# Patient Record
Sex: Female | Born: 1960 | Race: White | Hispanic: No | Marital: Married | Smoking: Never smoker
Health system: Southern US, Community
[De-identification: ages and names within clinical notes are randomized; demographics above are authoritative.]

## PROBLEM LIST (undated history)

## (undated) DIAGNOSIS — I10 Essential (primary) hypertension: Secondary | ICD-10-CM

## (undated) DIAGNOSIS — I639 Cerebral infarction, unspecified: Secondary | ICD-10-CM

## (undated) DIAGNOSIS — E039 Hypothyroidism, unspecified: Secondary | ICD-10-CM

## (undated) DIAGNOSIS — G2581 Restless legs syndrome: Secondary | ICD-10-CM

## (undated) HISTORY — DX: Hypothyroidism, unspecified: E03.9

## (undated) HISTORY — DX: Cerebral infarction, unspecified: I63.9

## (undated) HISTORY — DX: Restless legs syndrome: G25.81

## (undated) HISTORY — DX: Essential (primary) hypertension: I10

---

## 1998-06-22 ENCOUNTER — Encounter (HOSPITAL_COMMUNITY): Admission: RE | Admit: 1998-06-22 | Discharge: 1998-07-10 | Payer: Self-pay | Admitting: *Deleted

## 1998-07-08 ENCOUNTER — Inpatient Hospital Stay (HOSPITAL_COMMUNITY): Admission: AD | Admit: 1998-07-08 | Discharge: 1998-07-11 | Payer: Self-pay | Admitting: Obstetrics and Gynecology

## 2005-05-20 ENCOUNTER — Emergency Department (HOSPITAL_COMMUNITY): Admission: EM | Admit: 2005-05-20 | Discharge: 2005-05-20 | Payer: Self-pay | Admitting: Emergency Medicine
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
## 2019-10-10 ENCOUNTER — Ambulatory Visit: Payer: Federal, State, Local not specified - PPO | Attending: Internal Medicine

## 2019-10-10 DIAGNOSIS — Z23 Encounter for immunization: Secondary | ICD-10-CM

## 2019-10-10 NOTE — Progress Notes (Signed)
   Covid-19 Vaccination Clinic  Name:  SEMYA KLINKE    MRN: [id number] DOB: 13-Apr-1961  10/10/2019  Ms. Geck was observed post Covid-19 immunization for 15 minutes without incident. She was provided with Vaccine Information Sheet and instruction to access the V-Safe system.   Ms. Caratachea was instructed to call 911 with any severe reactions post vaccine: Marland Kitchen Difficulty breathing  . Swelling of face and throat  . A fast heartbeat  . A bad rash all over body  . Dizziness and weakness   Immunizations Administered    Name Date Dose VIS Date Route   Pfizer COVID-19 Vaccine 10/10/2019  2:44 PM 0.3 mL 07/17/2018 Intramuscular   Manufacturer: [employer], [employer]   Lot: [id number]   NDC: [address]-2

## 2019-11-04 ENCOUNTER — Ambulatory Visit: Payer: Federal, State, Local not specified - PPO | Attending: Internal Medicine

## 2019-11-04 DIAGNOSIS — Z23 Encounter for immunization: Secondary | ICD-10-CM

## 2019-11-04 NOTE — Progress Notes (Signed)
   Covid-19 Vaccination Clinic  Name:  HADLIE GIPSON    MRN: [id number] DOB: 1961-04-01  11/04/2019  Ms. Taussig was observed post Covid-19 immunization for 15 minutes without incident. She was provided with Vaccine Information Sheet and instruction to access the V-Safe system.   Ms. Frith was instructed to call 911 with any severe reactions post vaccine: Marland Kitchen Difficulty breathing  . Swelling of face and throat  . A fast heartbeat  . A bad rash all over body  . Dizziness and weakness   Immunizations Administered    Name Date Dose VIS Date Route   Pfizer COVID-19 Vaccine 11/04/2019  2:29 PM 0.3 mL 07/17/2018 Intramuscular   Manufacturer: [employer], [employer]   Lot: [id number]   NDC: [address]-1

## 2021-04-04 ENCOUNTER — Other Ambulatory Visit: Payer: Self-pay

## 2021-04-04 ENCOUNTER — Emergency Department (HOSPITAL_COMMUNITY): Payer: Federal, State, Local not specified - PPO

## 2021-04-04 ENCOUNTER — Inpatient Hospital Stay (HOSPITAL_COMMUNITY)
Admission: EM | Admit: 2021-04-04 | Discharge: 2021-04-08 | DRG: 065 | Disposition: A | Discharge status: Rehabilitation Facility | Payer: Federal, State, Local not specified - PPO | Attending: Internal Medicine | Admitting: Internal Medicine

## 2021-04-04 ENCOUNTER — Encounter (HOSPITAL_COMMUNITY): Payer: Self-pay | Admitting: Emergency Medicine

## 2021-04-04 DIAGNOSIS — I619 Nontraumatic intracerebral hemorrhage, unspecified: Secondary | ICD-10-CM | POA: Diagnosis not present

## 2021-04-04 DIAGNOSIS — M4802 Spinal stenosis, cervical region: Secondary | ICD-10-CM | POA: Diagnosis not present

## 2021-04-04 DIAGNOSIS — Z20822 Contact with and (suspected) exposure to covid-19: Secondary | ICD-10-CM | POA: Diagnosis not present

## 2021-04-04 DIAGNOSIS — R531 Weakness: Secondary | ICD-10-CM | POA: Diagnosis not present

## 2021-04-04 DIAGNOSIS — M6281 Muscle weakness (generalized): Secondary | ICD-10-CM | POA: Diagnosis not present

## 2021-04-04 DIAGNOSIS — I6389 Other cerebral infarction: Secondary | ICD-10-CM | POA: Diagnosis not present

## 2021-04-04 DIAGNOSIS — E876 Hypokalemia: Secondary | ICD-10-CM | POA: Diagnosis not present

## 2021-04-04 DIAGNOSIS — M50222 Other cervical disc displacement at C5-C6 level: Secondary | ICD-10-CM | POA: Diagnosis not present

## 2021-04-04 DIAGNOSIS — I639 Cerebral infarction, unspecified: Secondary | ICD-10-CM | POA: Diagnosis not present

## 2021-04-04 DIAGNOSIS — I1310 Hypertensive heart and chronic kidney disease without heart failure, with stage 1 through stage 4 chronic kidney disease, or unspecified chronic kidney disease: Secondary | ICD-10-CM | POA: Diagnosis present

## 2021-04-04 DIAGNOSIS — I6381 Other cerebral infarction due to occlusion or stenosis of small artery: Principal | ICD-10-CM | POA: Diagnosis present

## 2021-04-04 DIAGNOSIS — E785 Hyperlipidemia, unspecified: Secondary | ICD-10-CM | POA: Diagnosis not present

## 2021-04-04 DIAGNOSIS — R278 Other lack of coordination: Secondary | ICD-10-CM | POA: Diagnosis present

## 2021-04-04 DIAGNOSIS — I6501 Occlusion and stenosis of right vertebral artery: Secondary | ICD-10-CM | POA: Diagnosis not present

## 2021-04-04 DIAGNOSIS — M50221 Other cervical disc displacement at C4-C5 level: Secondary | ICD-10-CM | POA: Diagnosis not present

## 2021-04-04 DIAGNOSIS — S22009A Unspecified fracture of unspecified thoracic vertebra, initial encounter for closed fracture: Secondary | ICD-10-CM | POA: Diagnosis not present

## 2021-04-04 DIAGNOSIS — G8191 Hemiplegia, unspecified affecting right dominant side: Secondary | ICD-10-CM | POA: Diagnosis present

## 2021-04-04 DIAGNOSIS — N179 Acute kidney failure, unspecified: Secondary | ICD-10-CM | POA: Diagnosis not present

## 2021-04-04 DIAGNOSIS — N1832 Chronic kidney disease, stage 3b: Secondary | ICD-10-CM | POA: Diagnosis present

## 2021-04-04 DIAGNOSIS — I10 Essential (primary) hypertension: Secondary | ICD-10-CM | POA: Diagnosis not present

## 2021-04-04 DIAGNOSIS — K5901 Slow transit constipation: Secondary | ICD-10-CM | POA: Diagnosis not present

## 2021-04-04 DIAGNOSIS — R200 Anesthesia of skin: Secondary | ICD-10-CM | POA: Diagnosis present

## 2021-04-04 DIAGNOSIS — R030 Elevated blood-pressure reading, without diagnosis of hypertension: Secondary | ICD-10-CM | POA: Diagnosis not present

## 2021-04-04 DIAGNOSIS — Z6841 Body Mass Index (BMI) 40.0 and over, adult: Secondary | ICD-10-CM

## 2021-04-04 DIAGNOSIS — I3139 Other pericardial effusion (noninflammatory): Secondary | ICD-10-CM

## 2021-04-04 DIAGNOSIS — I739 Peripheral vascular disease, unspecified: Secondary | ICD-10-CM | POA: Diagnosis not present

## 2021-04-04 DIAGNOSIS — I6782 Cerebral ischemia: Secondary | ICD-10-CM | POA: Diagnosis not present

## 2021-04-04 DIAGNOSIS — R29702 NIHSS score 2: Secondary | ICD-10-CM | POA: Diagnosis present

## 2021-04-04 DIAGNOSIS — E039 Hypothyroidism, unspecified: Secondary | ICD-10-CM | POA: Diagnosis present

## 2021-04-04 DIAGNOSIS — R9082 White matter disease, unspecified: Secondary | ICD-10-CM | POA: Diagnosis not present

## 2021-04-04 DIAGNOSIS — Z8673 Personal history of transient ischemic attack (TIA), and cerebral infarction without residual deficits: Secondary | ICD-10-CM

## 2021-04-04 DIAGNOSIS — W19XXXA Unspecified fall, initial encounter: Secondary | ICD-10-CM

## 2021-04-04 DIAGNOSIS — R7989 Other specified abnormal findings of blood chemistry: Secondary | ICD-10-CM | POA: Diagnosis present

## 2021-04-04 DIAGNOSIS — Z8249 Family history of ischemic heart disease and other diseases of the circulatory system: Secondary | ICD-10-CM

## 2021-04-04 DIAGNOSIS — Z79899 Other long term (current) drug therapy: Secondary | ICD-10-CM

## 2021-04-04 DIAGNOSIS — E782 Mixed hyperlipidemia: Secondary | ICD-10-CM | POA: Diagnosis not present

## 2021-04-04 LAB — PROTIME-INR
INR: 1 (ref 0.8–1.2)
Prothrombin Time: 13.4 seconds (ref 11.4–15.2)

## 2021-04-04 LAB — DIFFERENTIAL
Abs Immature Granulocytes: 0.02 10*3/uL (ref 0.00–0.07)
Basophils Absolute: 0.1 10*3/uL (ref 0.0–0.1)
Basophils Relative: 1 %
Eosinophils Absolute: 0.2 10*3/uL (ref 0.0–0.5)
Eosinophils Relative: 2 %
Immature Granulocytes: 0 %
Lymphocytes Relative: 23 %
Lymphs Abs: 1.7 10*3/uL (ref 0.7–4.0)
Monocytes Absolute: 0.4 10*3/uL (ref 0.1–1.0)
Monocytes Relative: 5 %
Neutro Abs: 5.2 10*3/uL (ref 1.7–7.7)
Neutrophils Relative %: 69 %

## 2021-04-04 LAB — CBC
HCT: 43.4 % (ref 36.0–46.0)
Hemoglobin: 14 g/dL (ref 12.0–15.0)
MCH: 29.7 pg (ref 26.0–34.0)
MCHC: 32.3 g/dL (ref 30.0–36.0)
MCV: 92.1 fL (ref 80.0–100.0)
Platelets: 252 10*3/uL (ref 150–400)
RBC: 4.71 MIL/uL (ref 3.87–5.11)
RDW: 14.6 % (ref 11.5–15.5)
WBC: 7.6 10*3/uL (ref 4.0–10.5)
nRBC: 0 % (ref 0.0–0.2)

## 2021-04-04 LAB — I-STAT CHEM 8, ED
BUN: 11 mg/dL (ref 6–20)
Calcium, Ion: 1.15 mmol/L (ref 1.15–1.40)
Chloride: 100 mmol/L (ref 98–111)
Creatinine, Ser: 1.6 mg/dL — ABNORMAL HIGH (ref 0.44–1.00)
Glucose, Bld: 93 mg/dL (ref 70–99)
HCT: 43 % (ref 36.0–46.0)
Hemoglobin: 14.6 g/dL (ref 12.0–15.0)
Potassium: 4 mmol/L (ref 3.5–5.1)
Sodium: 139 mmol/L (ref 135–145)
TCO2: 29 mmol/L (ref 22–32)

## 2021-04-04 LAB — COMPREHENSIVE METABOLIC PANEL
ALT: 14 U/L (ref 0–44)
AST: 27 U/L (ref 15–41)
Albumin: 4.4 g/dL (ref 3.5–5.0)
Alkaline Phosphatase: 57 U/L (ref 38–126)
Anion gap: 11 (ref 5–15)
BUN: 10 mg/dL (ref 6–20)
CO2: 28 mmol/L (ref 22–32)
Calcium: 9.6 mg/dL (ref 8.9–10.3)
Chloride: 99 mmol/L (ref 98–111)
Creatinine, Ser: 1.59 mg/dL — ABNORMAL HIGH (ref 0.44–1.00)
GFR, Estimated: 37 mL/min — ABNORMAL LOW (ref >60)
Glucose, Bld: 97 mg/dL (ref 70–99)
Potassium: 4 mmol/L (ref 3.5–5.1)
Sodium: 138 mmol/L (ref 135–145)
Total Bilirubin: 0.8 mg/dL (ref 0.3–1.2)
Total Protein: 7.7 g/dL (ref 6.5–8.1)

## 2021-04-04 LAB — APTT: aPTT: 31 seconds (ref 24–36)

## 2021-04-04 LAB — CBG MONITORING, ED: Glucose-Capillary: 101 mg/dL — ABNORMAL HIGH (ref 70–99)

## 2021-04-04 LAB — I-STAT BETA HCG BLOOD, ED (MC, WL, AP ONLY): I-stat hCG, quantitative: <5 m[IU]/mL (ref <5)

## 2021-04-04 IMAGING — MR MR CERVICAL SPINE W/O CM
5 of 8 series · 25 of 48 positions shown · non-contrast
Comparison: None available.

CLINICAL DATA: Initial evaluation for cervical radiculopathy, right
arm and leg paresthesias.

EXAM:
MRI CERVICAL SPINE WITHOUT CONTRAST
TECHNIQUE: Multiplanar, multisequence MR imaging of the cervical spine was
performed. No intravenous contrast was administered.

[Series 5: T2 · sagittal · 3.0mm · 0.69mm/px · 3 of 15 slices shown (1 of 2)]
[im 1/15]
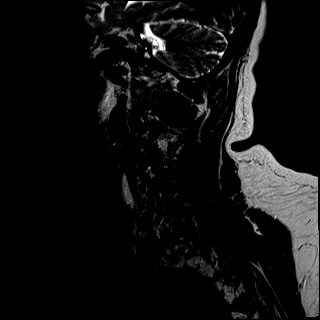
[im 8/15]
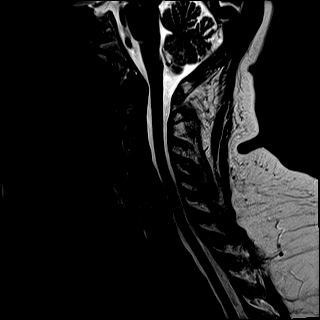
[im 15/15]
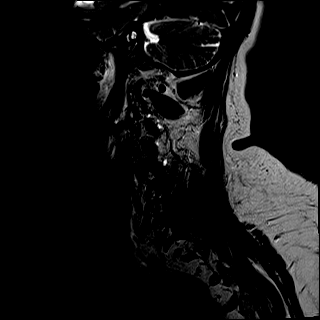

[Series 6: T1 · sagittal · 3.0mm · 0.69mm/px · 3 of 15 slices shown]
[im 1/15]
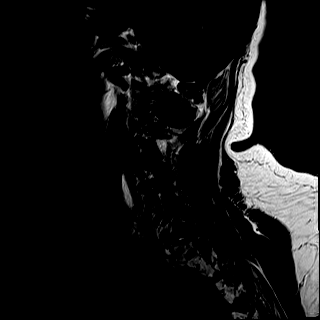
[im 8/15]
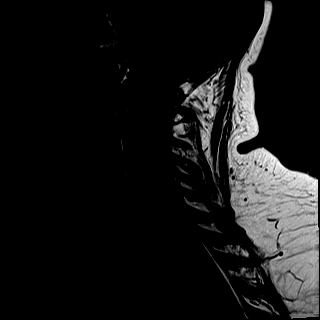
[im 15/15]
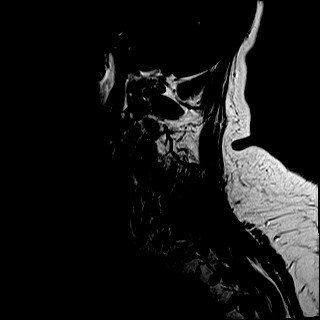

[Series 7: STIR · sagittal · 3.0mm · 0.86mm/px · 3 of 15 slices shown]
[im 1/15]
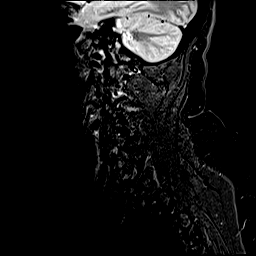
[im 8/15]
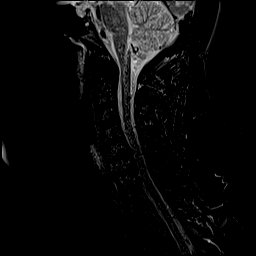
[im 15/15]
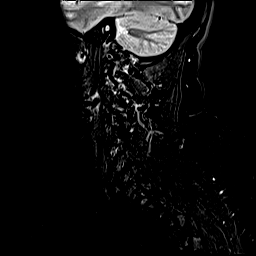

[Series 8: T2 · axial · 3.0mm · 0.66mm/px · z∈[-287,-143]mm · 8 of 46 slices shown (2 of 2)]
[im 1/46]
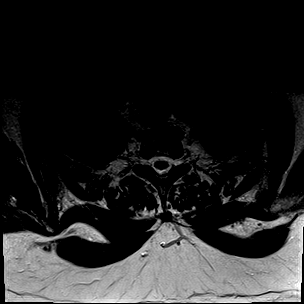
[im 7/46]
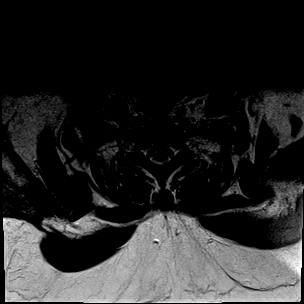
[im 13/46]
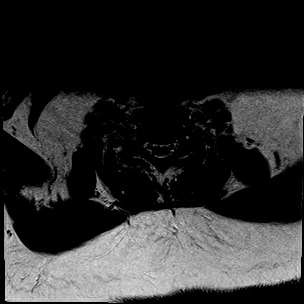
[im 20/46]
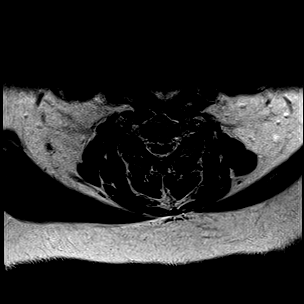
[im 26/46]
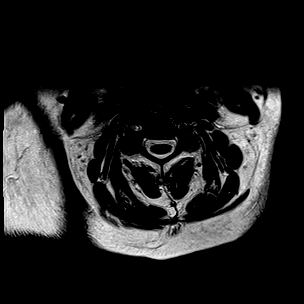
[im 33/46]
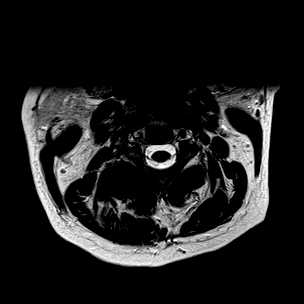
[im 39/46]
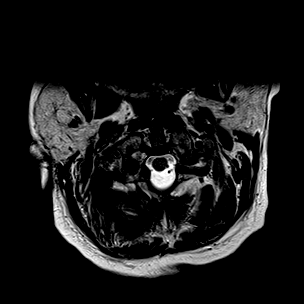
[im 46/46]
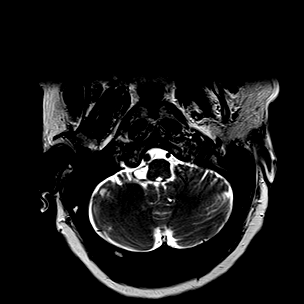

[Series 9: GRE · axial · 3.0mm · 0.39mm/px · z∈[-287,-143]mm · 8 of 46 slices shown]
[im 1/46]
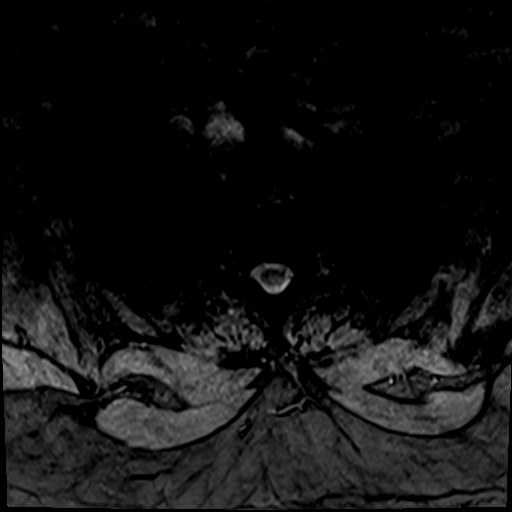
[im 7/46]
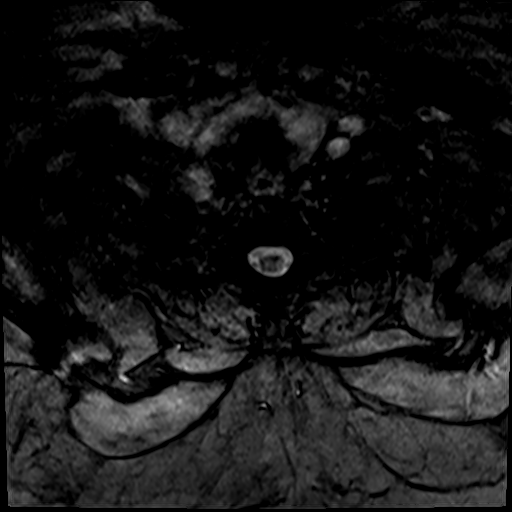
[im 13/46]
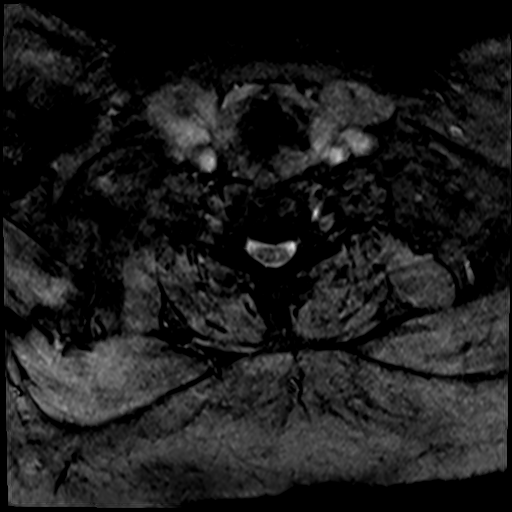
[im 20/46]
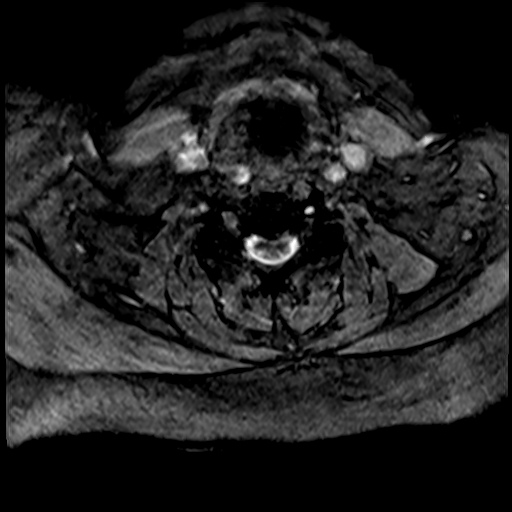
[im 26/46]
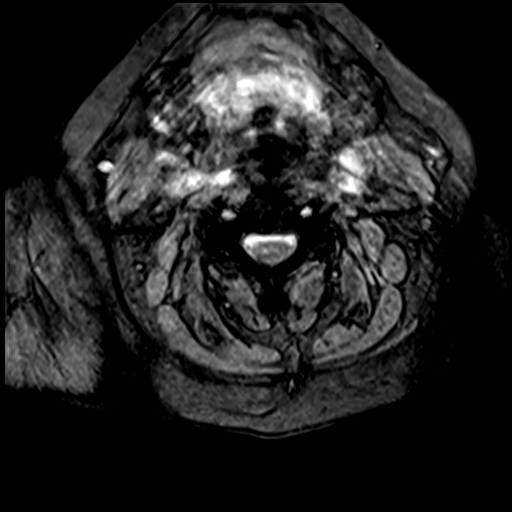
[im 33/46]
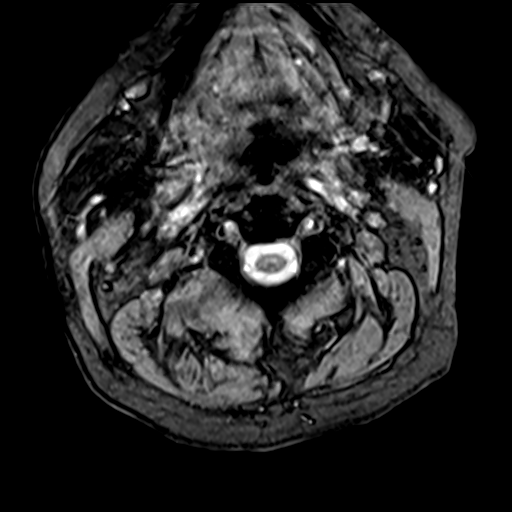
[im 39/46]
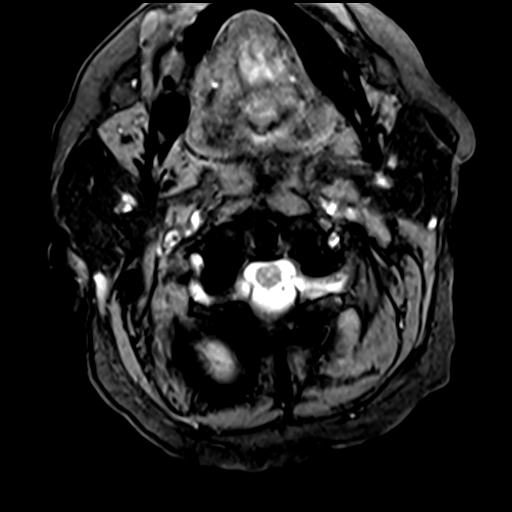
[im 46/46]
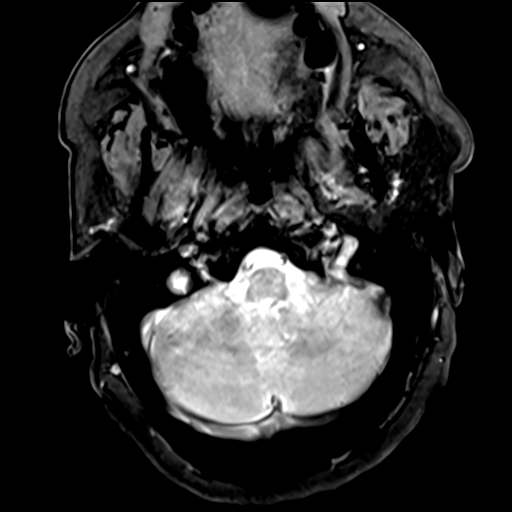

[25 of 48 positions shown; findings below may reference images not displayed]

FINDINGS: Alignment: Straightening with slight reversal of the normal cervical
lordosis. No listhesis.

Vertebrae: Vertebral body height maintained without acute or chronic
fracture. Bone marrow signal intensity within normal limits. No
discrete or worrisome osseous lesions. No abnormal marrow edema.

Cord: Normal signal and morphology.

Posterior Fossa, vertebral arteries, paraspinal tissues:
Craniocervical junction within normal limits. Paraspinous soft
tissues demonstrate no acute finding. Absent flow void within the
right vertebral artery, likely occluded.

Disc levels:

C2-C3: Negative interspace. Mild left-sided facet hypertrophy. No
stenosis.

C3-C4: Mild endplate spurring without significant disc bulge. Mild
facet hypertrophy. No spinal stenosis. Mild bilateral C4 foraminal
narrowing.

C4-C5: Mild disc bulge with uncovertebral hypertrophy. Right-sided
facet degeneration. No spinal stenosis. Mild left C5 foraminal
narrowing. Right neural foramina remains patent.

C5-C6: Degenerative intervertebral disc space narrowing. Left
paracentral disc protrusion indents the left ventral thecal sac,
contacting and mildly flattening the ventral cord (series 8, image
29). Mild spinal stenosis. Superimposed uncovertebral spurring with
resultant mild right C6 foraminal narrowing. Left neural foramina
remains patent.

C6-C7: Degenerative intervertebral disc space narrowing with diffuse
disc bulge, endplate spurring, and uncovertebral hypertrophy. Broad
posterior disc osteophyte flattens and partially faces the ventral
thecal sac with resultant mild spinal stenosis. Foramina remain
patent.

C7-T1: Negative interspace. Left greater than right facet
hypertrophy. No spinal stenosis. Mild left C8 foraminal narrowing.
Right neural foramina remains patent.

Visualized upper thoracic spine demonstrates no significant finding.
IMPRESSION: 1. Left paracentral disc protrusion at C5-6 contacting and mildly
flattening the left hemi cord and resulting in mild spinal stenosis.
2. Degenerative disc osteophyte at C6-7 with resultant mild spinal
stenosis.
3. Mild bilateral C4, left C5, right C6, and left C8 foraminal
stenosis related to disc bulging and uncovertebral disease.
4. Absent flow void within the right vertebral artery, likely
occluded.

## 2021-04-04 MED ORDER — SODIUM CHLORIDE 0.9% FLUSH
3.0000 mL | Freq: Once | INTRAVENOUS | Status: DC
Start: 2021-04-04 — End: 2021-04-08

## 2021-04-04 MED ORDER — LORAZEPAM 1 MG PO TABS
1.0000 mg | ORAL_TABLET | Freq: Once | ORAL | Status: AC | PRN
  Administered 2021-04-04: 1 mg via ORAL
  Filled 2021-04-04: qty 1

## 2021-04-04 MED ORDER — AMLODIPINE BESYLATE 5 MG PO TABS
10.0000 mg | ORAL_TABLET | Freq: Once | ORAL | Status: AC
  Administered 2021-04-04: 10 mg via ORAL
  Filled 2021-04-04: qty 2

## 2021-04-04 NOTE — ED Notes (Signed)
Pt transported to MRI 

## 2021-04-04 NOTE — ED Provider Notes (Signed)
[facility] EMERGENCY DEPARTMENT Provider Note   CSN: [id number] Arrival date & time: 04/04/21  1447     History Chief Complaint  Patient presents with   Weakness    Christine Fritz is a 60 y.o. female present emergency department with paresthesias in the right arm and leg.  She reports that she had an episode 2 weeks ago that lasted about a day and resolved on its own.  She reports her symptoms returned yesterday and persisted throughout the evening.  She has paresthesias and a sense of "heaviness or something different" on the right arm and leg.  It spares her face.  She denies any headache.  Denies blurred vision.  Denies prior history of TIA or stroke.  She reports she does not see a doctor often, last went to the doctor's office maybe 20 years ago.  She is unaware of any history of hypertension, high cholesterol, diabetes.  She denies any chest pain or pressure with these symptoms.  She is here with her husband at the bedside  HPI     History reviewed. No pertinent past medical history.  There are no problems to display for this patient.   History reviewed. No pertinent surgical history.   OB History   No obstetric history on file.     No family history on file.  Social History   Tobacco Use   Smoking status: Never   Smokeless tobacco: Never  Substance Use Topics   Alcohol use: Not Currently   Drug use: Not Currently    Home Medications Prior to Admission medications   Medication Sig Start Date End Date Taking? Authorizing Provider  Chlorphen-Pseudoephed-APAP (TYLENOL ALLERGY SINUS PO) Take 2 tablets by mouth daily.   Yes [provider]  magnesium oxide (MAG-OX) 400 MG tablet Take 400 mg by mouth daily.   Yes [provider]    Allergies    Patient has no known allergies.  Review of Systems   Review of Systems  Constitutional:  Negative for chills and fever.  HENT:  Negative for ear pain and sore throat.   Eyes:   Negative for pain and visual disturbance.  Respiratory:  Negative for cough and shortness of breath.   Cardiovascular:  Negative for chest pain and palpitations.  Gastrointestinal:  Negative for abdominal pain and vomiting.  Genitourinary:  Negative for dysuria and hematuria.  Musculoskeletal:  Negative for arthralgias and back pain.  Skin:  Negative for color change and rash.  Neurological:  Positive for weakness and numbness. Negative for dizziness, syncope, facial asymmetry, light-headedness and headaches.  All other systems reviewed and are negative.  Physical Exam Updated Vital Signs BP (!) 173/122   Pulse 71   Temp 98.1 F (36.7 C) (Oral)   Resp 15   Ht 5\' 4"  (1.626 m)   Wt 112 kg   SpO2 99%   BMI 42.40 kg/m   Physical Exam Constitutional:      General: She is not in acute distress. HENT:     Head: Normocephalic and atraumatic.  Eyes:     Conjunctiva/sclera: Conjunctivae normal.     Pupils: Pupils are equal, round, and reactive to light.  Cardiovascular:     Rate and Rhythm: Normal rate and regular rhythm.     Pulses: Normal pulses.  Pulmonary:     Effort: Pulmonary effort is normal. No respiratory distress.  Abdominal:     General: There is no distension.     Tenderness: There is no  abdominal tenderness.  Skin:    General: Skin is warm and dry.  Neurological:     General: No focal deficit present.     Mental Status: She is alert and oriented to person, place, and time. Mental status is at baseline.     Cranial Nerves: No cranial nerve deficit.     Sensory: No sensory deficit.     Motor: No weakness.     Coordination: Coordination normal.     Gait: Gait normal.  Psychiatric:        Mood and Affect: Mood normal.        Behavior: Behavior normal.    ED Results / Procedures / Treatments   Labs (all labs ordered are listed, but only abnormal results are displayed) Labs Reviewed  COMPREHENSIVE METABOLIC PANEL - Abnormal; Notable for the following components:       Result Value   Creatinine, Ser 1.59 (*)    GFR, Estimated 37 (*)    All other components within normal limits  I-STAT CHEM 8, ED - Abnormal; Notable for the following components:   Creatinine, Ser 1.60 (*)    All other components within normal limits  CBG MONITORING, ED - Abnormal; Notable for the following components:   Glucose-Capillary 101 (*)    All other components within normal limits  PROTIME-INR  APTT  CBC  DIFFERENTIAL  I-STAT BETA HCG BLOOD, ED ([facility], [facility], [facility] ONLY)    EKG EKG Interpretation  Date/Time:  Sunday April 04 2021 16:34:26 EST Ventricular Rate:  79 PR Interval:  188 QRS Duration: 84 QT Interval:  350 QTC Calculation: 401 R Axis:   -9 Text Interpretation: Normal sinus rhythm Low voltage QRS Confirmed by Octaviano Glow [phone]) on 04/04/2021 6:35:06 PM  Radiology CT HEAD WO CONTRAST  Result Date: 04/04/2021 CLINICAL DATA:  Neuro deficit, acute, stroke suspected. Right-sided weakness. EXAM: CT HEAD WITHOUT CONTRAST TECHNIQUE: Contiguous axial images were obtained from the base of the skull through the vertex without intravenous contrast. COMPARISON:  None. 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 Mild chronic small vessel disease throughout the deep white matter. No hemorrhage or hydrocephalus. Vascular: No hyperdense vessel or unexpected calcification. Skull: No acute calvarial abnormality. 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 Chronic small vessel disease. Electronically Signed   By: Rolm Baptise M.D.   On: 04/04/2021 18:36    Procedures Procedures   Medications Ordered in ED Medications  sodium chloride flush (NS) 0.9 % injection 3 mL (has no administration in time range)  LORazepam (ATIVAN) tablet 1 mg (has no administration in time range)  amLODipine (NORVASC) tablet 10 mg (10 mg Oral Given 04/04/21 1940)    ED Course  I have reviewed  the triage vital signs and the nursing notes.  Pertinent labs & imaging results that were available during my care of the patient were reviewed by me and considered in my medical decision making (see chart for details).  Patient presents ED complaining of transient right-sided weakness, also paresthesias, that recurred again yesterday evening into today.  She has no obvious neurological deficits on exam.  No signs of large vessel occlusion.  She does not even report paresthesias to fine touch on the right side of her body, and simply repeats that it "feels different".  This could still be consistent with an infarct.  CT scan of the brain was reviewed showing possible right subacute versus age-indeterminate lacunar infarct.  MRI of the brain is now  pending.  I personally reviewed and interpreted the remainder of her labs.  Creatinine is 1.6.  Unclear what her recent baseline was.  Electrolytes are within normal limits.  Glucose within normal limits.  She is hypertensive, but may have underlying hypertension for a long time as she does not see doctors.  I started her on amlodipine here.  Supplemental history is provided by the patient's husband at bedside.  Clinical Course as of 04/04/21 2158  Nancy Fetter Apr 04, 2021  2139 Patient next for MRI. [MT]    Clinical Course User Index [MT] Wyvonnia Dusky, MD  11 pm - signed out to Dr Randal Buba pending MRI   Final Clinical Impression(s) / ED Diagnoses Final diagnoses:  None    Rx / DC Orders ED Discharge Orders     None        Destyni Hoppel, Carola Rhine, MD 04/05/21 [phone]

## 2021-04-04 NOTE — ED Triage Notes (Signed)
PT reports R sided weakness (arm and leg) that initially happened for a few hours- 2 weeks ago.  States she has felt fine for the last 2 weeks and had symptoms for a few hours again yesterday.  Went to bed (LKW) at 10pm.  Woke up with R sided arm and leg weakness at 2am.  States it has improved some since this morning.  R arm drift.

## 2021-04-04 NOTE — ED Provider Notes (Signed)
Emergency Medicine Provider Triage Evaluation Note  Christine Fritz , a 60 y.o. female  was evaluated in triage.  Pt complains of right-sided weakness.  She reports that 2 weeks ago she fell related to weakness in her right leg and right arm.  She states that she could not get up due to the weakness.  She states that after couple of hours this resolved.  She states that last night she had similar symptoms however this seemed to resolve.  When she went to bed last night around 10 PM she was within normal limits.  She woke up around 2 AM and noticed weakness in her right arm.  She states that it has been consistent since then.  She thought it was slightly improving while in the waiting room however still present.  Her significant other is with her.  Has not noticed any facial droop or speech changes.  No history of stroke.  Review of Systems  Positive: + R arm weakness Negative: - facial droop, speech changes  Physical Exam  BP (!) 156/128   Pulse 76   Temp 97.6 F (36.4 C)   Resp 18   SpO2 99%  Gen:   Awake, no distress   Resp:  Normal effort  MSK:   Moves extremities without difficulty  Other:  CN's intact. + R arm drift noted. Strength equal. Sensation intact bilaterally.   Medical Decision Making  Medically screening exam initiated at 4:31 PM.  Appropriate orders placed.  Rylea L Sabino was informed that the remainder of the evaluation will be completed by another provider, this initial triage assessment does not replace that evaluation, and the importance of remaining in the ED until their evaluation is complete.  Stroke workup. LVO negative. Out of window for tPA.    Tanda Rockers, PA-C 04/04/21 1633    Gloris Manchester, MD 04/05/21 [phone]

## 2021-04-04 NOTE — ED Notes (Addendum)
Created in error

## 2021-04-04 NOTE — ED Notes (Signed)
This RN was called to beside because pt was going to be transported to MRI. Pt's husband stated "he had been trying to get her wedding ring off but her finger was too swollen. Per husband he said cut it off if you absolutely have to." Pt became tearful. This RN told the pt and her husband to give me just a minute and I would try to get it off. This RN took a pack of lube and with the help of the transported was able to get the pt's ring off. This RN placed the pt's wedding ring in a cup and gave it to her husband at bedside. Will continue to monitor.

## 2021-04-05 ENCOUNTER — Inpatient Hospital Stay (HOSPITAL_COMMUNITY): Payer: Federal, State, Local not specified - PPO

## 2021-04-05 ENCOUNTER — Observation Stay (HOSPITAL_COMMUNITY): Payer: Federal, State, Local not specified - PPO

## 2021-04-05 ENCOUNTER — Encounter (HOSPITAL_COMMUNITY): Payer: Self-pay | Admitting: Internal Medicine

## 2021-04-05 DIAGNOSIS — G8191 Hemiplegia, unspecified affecting right dominant side: Secondary | ICD-10-CM | POA: Diagnosis present

## 2021-04-05 DIAGNOSIS — R7989 Other specified abnormal findings of blood chemistry: Secondary | ICD-10-CM | POA: Diagnosis present

## 2021-04-05 DIAGNOSIS — G2581 Restless legs syndrome: Secondary | ICD-10-CM | POA: Diagnosis not present

## 2021-04-05 DIAGNOSIS — Z8249 Family history of ischemic heart disease and other diseases of the circulatory system: Secondary | ICD-10-CM | POA: Diagnosis not present

## 2021-04-05 DIAGNOSIS — I3139 Other pericardial effusion (noninflammatory): Secondary | ICD-10-CM | POA: Diagnosis not present

## 2021-04-05 DIAGNOSIS — N1831 Chronic kidney disease, stage 3a: Secondary | ICD-10-CM | POA: Diagnosis not present

## 2021-04-05 DIAGNOSIS — I6611 Occlusion and stenosis of right anterior cerebral artery: Secondary | ICD-10-CM | POA: Diagnosis not present

## 2021-04-05 DIAGNOSIS — E785 Hyperlipidemia, unspecified: Secondary | ICD-10-CM | POA: Diagnosis present

## 2021-04-05 DIAGNOSIS — M4802 Spinal stenosis, cervical region: Secondary | ICD-10-CM | POA: Diagnosis present

## 2021-04-05 DIAGNOSIS — I10 Essential (primary) hypertension: Secondary | ICD-10-CM | POA: Diagnosis not present

## 2021-04-05 DIAGNOSIS — E782 Mixed hyperlipidemia: Secondary | ICD-10-CM | POA: Diagnosis not present

## 2021-04-05 DIAGNOSIS — R29702 NIHSS score 2: Secondary | ICD-10-CM | POA: Diagnosis present

## 2021-04-05 DIAGNOSIS — K5901 Slow transit constipation: Secondary | ICD-10-CM | POA: Diagnosis not present

## 2021-04-05 DIAGNOSIS — E876 Hypokalemia: Secondary | ICD-10-CM | POA: Diagnosis present

## 2021-04-05 DIAGNOSIS — I6389 Other cerebral infarction: Secondary | ICD-10-CM

## 2021-04-05 DIAGNOSIS — J90 Pleural effusion, not elsewhere classified: Secondary | ICD-10-CM | POA: Diagnosis not present

## 2021-04-05 DIAGNOSIS — Z6841 Body Mass Index (BMI) 40.0 and over, adult: Secondary | ICD-10-CM | POA: Diagnosis not present

## 2021-04-05 DIAGNOSIS — R278 Other lack of coordination: Secondary | ICD-10-CM | POA: Diagnosis present

## 2021-04-05 DIAGNOSIS — N1832 Chronic kidney disease, stage 3b: Secondary | ICD-10-CM | POA: Diagnosis present

## 2021-04-05 DIAGNOSIS — I69351 Hemiplegia and hemiparesis following cerebral infarction affecting right dominant side: Secondary | ICD-10-CM | POA: Diagnosis not present

## 2021-04-05 DIAGNOSIS — I639 Cerebral infarction, unspecified: Secondary | ICD-10-CM | POA: Diagnosis not present

## 2021-04-05 DIAGNOSIS — Z79899 Other long term (current) drug therapy: Secondary | ICD-10-CM | POA: Diagnosis not present

## 2021-04-05 DIAGNOSIS — Z23 Encounter for immunization: Secondary | ICD-10-CM | POA: Diagnosis not present

## 2021-04-05 DIAGNOSIS — I1310 Hypertensive heart and chronic kidney disease without heart failure, with stage 1 through stage 4 chronic kidney disease, or unspecified chronic kidney disease: Secondary | ICD-10-CM | POA: Diagnosis not present

## 2021-04-05 DIAGNOSIS — Z8673 Personal history of transient ischemic attack (TIA), and cerebral infarction without residual deficits: Secondary | ICD-10-CM | POA: Diagnosis not present

## 2021-04-05 DIAGNOSIS — M50222 Other cervical disc displacement at C5-C6 level: Secondary | ICD-10-CM | POA: Diagnosis present

## 2021-04-05 DIAGNOSIS — Z043 Encounter for examination and observation following other accident: Secondary | ICD-10-CM | POA: Diagnosis not present

## 2021-04-05 DIAGNOSIS — I6381 Other cerebral infarction due to occlusion or stenosis of small artery: Principal | ICD-10-CM

## 2021-04-05 DIAGNOSIS — E039 Hypothyroidism, unspecified: Secondary | ICD-10-CM | POA: Diagnosis not present

## 2021-04-05 DIAGNOSIS — R200 Anesthesia of skin: Secondary | ICD-10-CM | POA: Diagnosis present

## 2021-04-05 DIAGNOSIS — Z20822 Contact with and (suspected) exposure to covid-19: Secondary | ICD-10-CM | POA: Diagnosis present

## 2021-04-05 DIAGNOSIS — I679 Cerebrovascular disease, unspecified: Secondary | ICD-10-CM | POA: Diagnosis not present

## 2021-04-05 DIAGNOSIS — I6501 Occlusion and stenosis of right vertebral artery: Secondary | ICD-10-CM | POA: Diagnosis present

## 2021-04-05 DIAGNOSIS — N179 Acute kidney failure, unspecified: Secondary | ICD-10-CM | POA: Diagnosis not present

## 2021-04-05 LAB — URINALYSIS, ROUTINE W REFLEX MICROSCOPIC
Bilirubin Urine: NEGATIVE
Glucose, UA: NEGATIVE mg/dL
Ketones, ur: NEGATIVE mg/dL
Nitrite: NEGATIVE
Protein, ur: 100 mg/dL — AB
Specific Gravity, Urine: 1.017 (ref 1.005–1.030)
WBC, UA: >50 WBC/hpf — ABNORMAL HIGH (ref 0–5)
pH: 6 (ref 5.0–8.0)

## 2021-04-05 LAB — ECHOCARDIOGRAM COMPLETE
AR max vel: 3.03 cm2
AV Area VTI: 2.53 cm2
AV Area mean vel: 2.74 cm2
AV Mean grad: 4 mmHg
AV Peak grad: 6.7 mmHg
Ao pk vel: 1.29 m/s
Area-P 1/2: 4.83 cm2
Height: 64 in
S' Lateral: 2.5 cm
Weight: 3952 oz

## 2021-04-05 LAB — RAPID URINE DRUG SCREEN, HOSP PERFORMED
Amphetamines: NOT DETECTED
Barbiturates: NOT DETECTED
Benzodiazepines: NOT DETECTED
Cocaine: NOT DETECTED
Opiates: NOT DETECTED
Tetrahydrocannabinol: NOT DETECTED

## 2021-04-05 LAB — CBC
HCT: 42.7 % (ref 36.0–46.0)
Hemoglobin: 14.1 g/dL (ref 12.0–15.0)
MCH: 30.2 pg (ref 26.0–34.0)
MCHC: 33 g/dL (ref 30.0–36.0)
MCV: 91.4 fL (ref 80.0–100.0)
Platelets: 236 10*3/uL (ref 150–400)
RBC: 4.67 MIL/uL (ref 3.87–5.11)
RDW: 14.5 % (ref 11.5–15.5)
WBC: 9 10*3/uL (ref 4.0–10.5)
nRBC: 0 % (ref 0.0–0.2)

## 2021-04-05 LAB — CREATININE, SERUM
Creatinine, Ser: 1.56 mg/dL — ABNORMAL HIGH (ref 0.44–1.00)
GFR, Estimated: 38 mL/min — ABNORMAL LOW (ref >60)

## 2021-04-05 LAB — LIPID PANEL
Cholesterol: 353 mg/dL — ABNORMAL HIGH (ref 0–200)
HDL: 49 mg/dL (ref >40)
LDL Cholesterol: 259 mg/dL — ABNORMAL HIGH (ref 0–99)
Total CHOL/HDL Ratio: 7.2 RATIO
Triglycerides: 227 mg/dL — ABNORMAL HIGH (ref <150)
VLDL: 45 mg/dL — ABNORMAL HIGH (ref 0–40)

## 2021-04-05 LAB — RESP PANEL BY RT-PCR (FLU A&B, COVID) ARPGX2
Influenza A by PCR: NEGATIVE
Influenza B by PCR: NEGATIVE
SARS Coronavirus 2 by RT PCR: NEGATIVE

## 2021-04-05 LAB — HEMOGLOBIN A1C
Hgb A1c MFr Bld: 5.5 % (ref 4.8–5.6)
Mean Plasma Glucose: 111 mg/dL

## 2021-04-05 LAB — HIV ANTIBODY (ROUTINE TESTING W REFLEX): HIV Screen 4th Generation wRfx: NONREACTIVE

## 2021-04-05 IMAGING — DX DG PELVIS 1-2V
1 series · 1 of 1 positions shown · non-contrast
Comparison: None.

CLINICAL DATA: Fall

EXAM:
PELVIS - 1-2 VIEW

[pelvis]
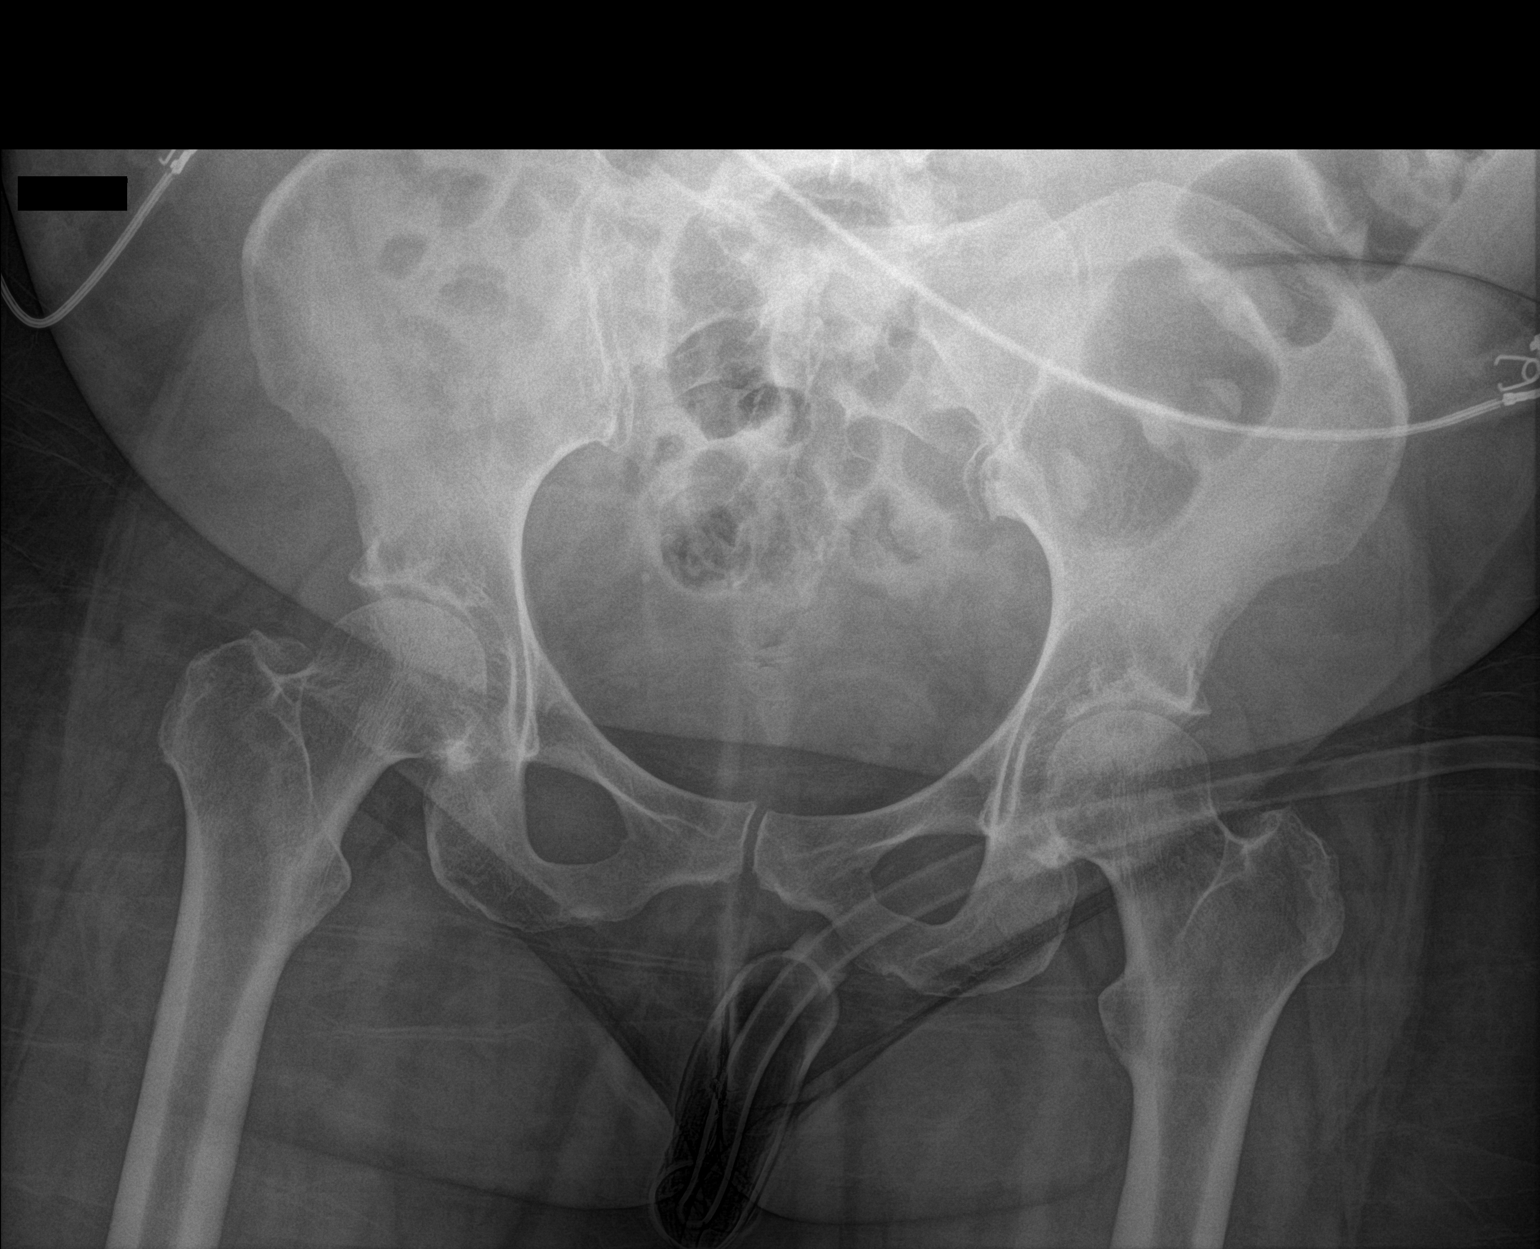

[1 of 1 positions shown; findings below may reference images not displayed]

FINDINGS: There is no evidence of pelvic fracture or diastasis. No pelvic bone
lesions are seen.
IMPRESSION: Negative

## 2021-04-05 IMAGING — MR MR MRA HEAD W/O CM
5 series · 38 of 48 positions shown · non-contrast
Comparison: Prior brain MRI from [DATE]

CLINICAL DATA: Follow-up examination for acute stroke.

EXAM:
MRA HEAD WITHOUT CONTRAST
TECHNIQUE: Angiographic images of the Circle of Willis were acquired using MRA
technique without intravenous contrast.

[Series 1: aahead_scout · sagittal · 1.6mm · 1.62mm/px · 19 of 128 slices shown]
[im 1/128]
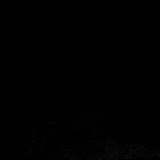
[im 8/128]
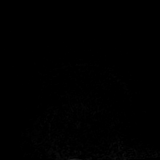
[im 15/128]
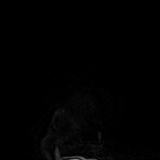
[im 22/128]
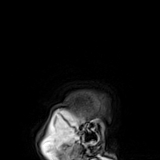
[im 29/128]
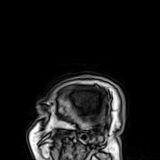
[im 36/128]
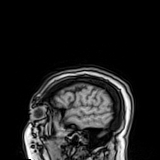
[im 43/128]
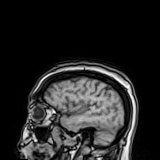
[im 50/128]
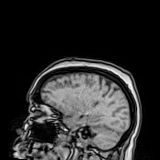
[im 57/128]
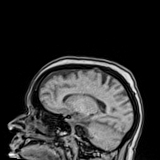
[im 64/128]
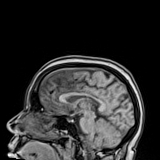
[im 71/128]
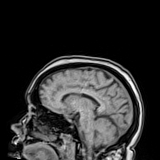
[im 78/128]
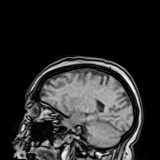
[im 85/128]
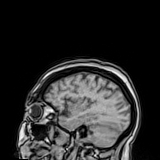
[im 92/128]
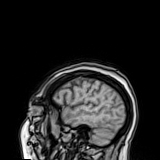
[im 99/128]
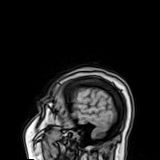
[im 106/128]
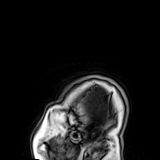
[im 113/128]
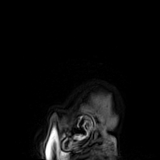
[im 120/128]
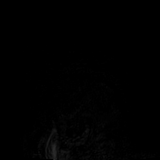
[im 128/128]
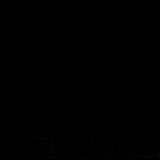

[Series 2: aahead_scout_mpr_sag · sagittal · 1.6mm · 1.60mm/px · 1 of 5 slices shown]
[im 1/5]
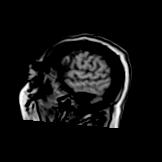

[Series 3: aahead_scout_mpr_cor · coronal · 1.6mm · 1.60mm/px · 1 of 3 slices shown]
[im 1/3]
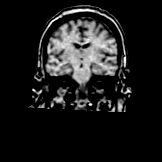

[Series 4: aahead_scout_mpr_tra · axial · 1.6mm · 1.60mm/px · 1 of 3 slices shown]
[im 1/3]
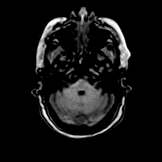

[Series 5: 3d cow · axial · 0.5mm · 0.41mm/px · z∈[-77,+4]mm · 16 of 172 slices shown]
[im 1/172]
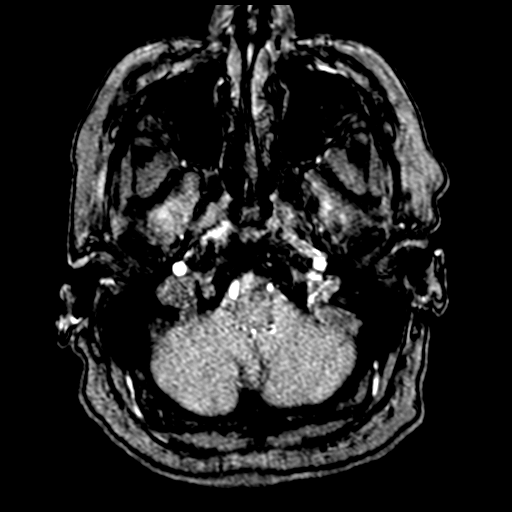
[im 7/172]
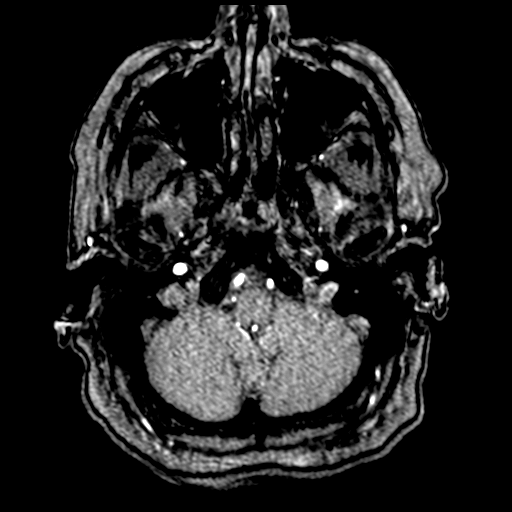
[im 14/172]
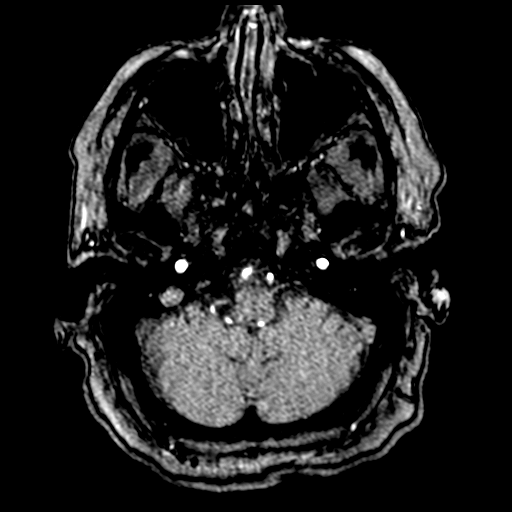
[im 21/172]
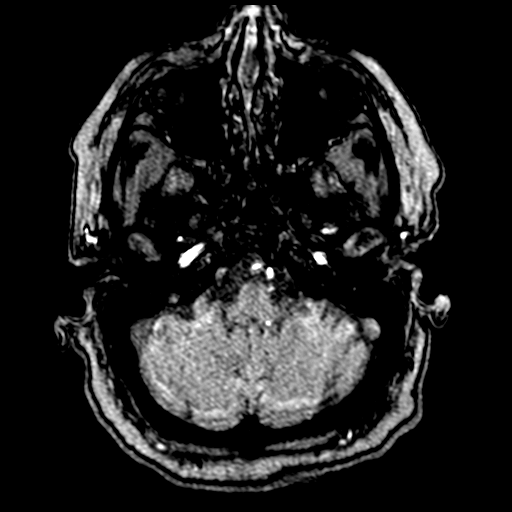
[im 28/172]
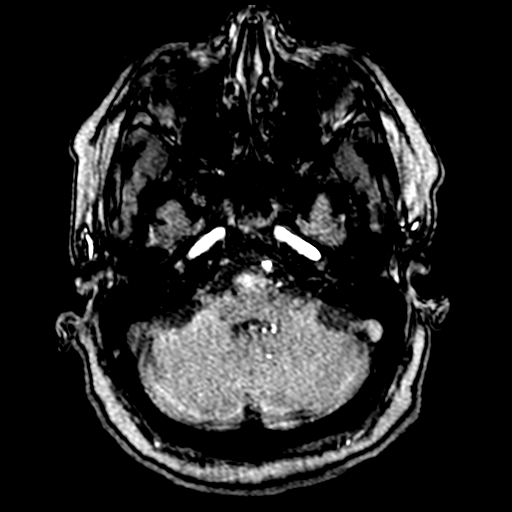
[im 35/172]
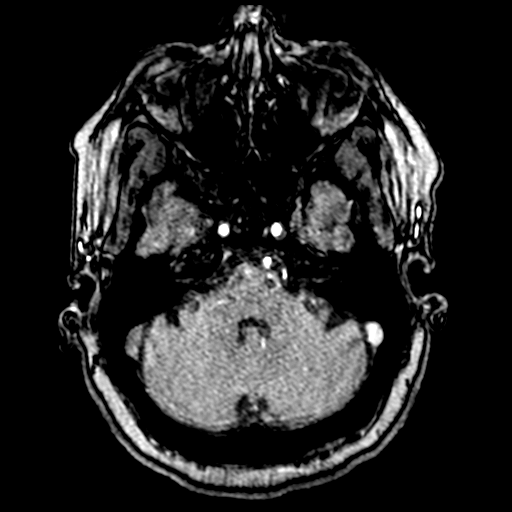
[im 42/172]
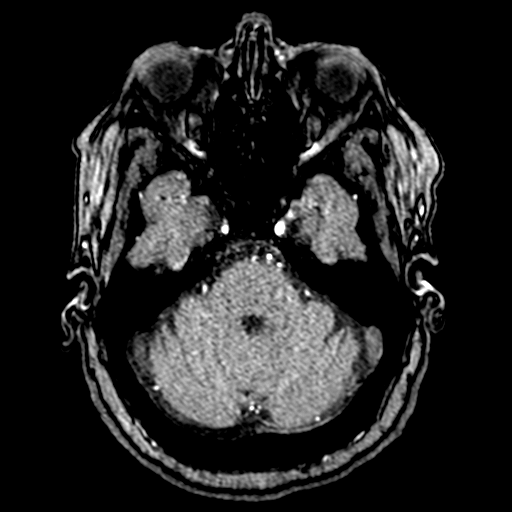
[im 48/172]
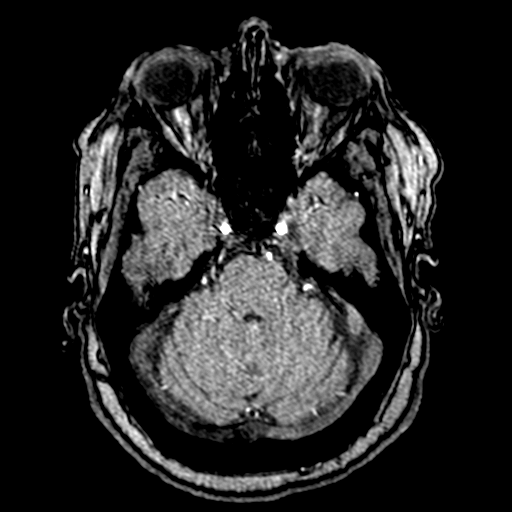
[im 55/172]
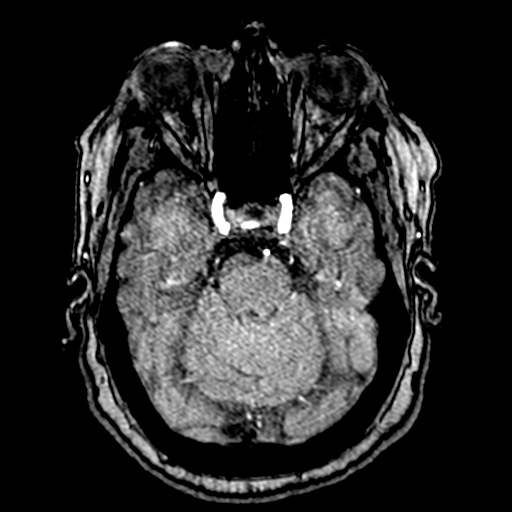
[im 62/172]
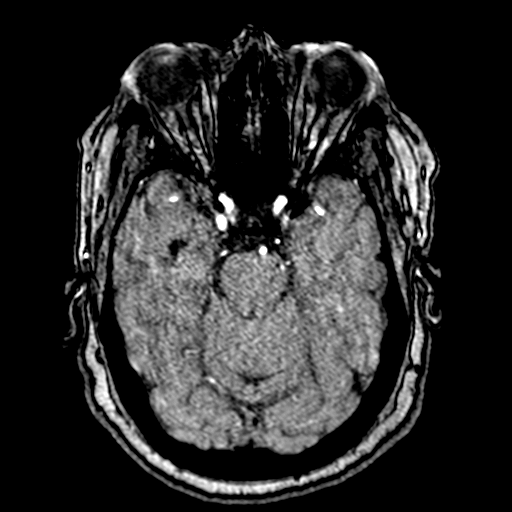
[im 69/172]
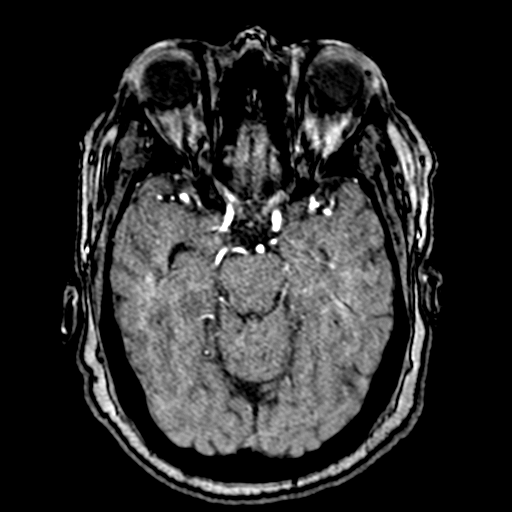
[im 76/172]
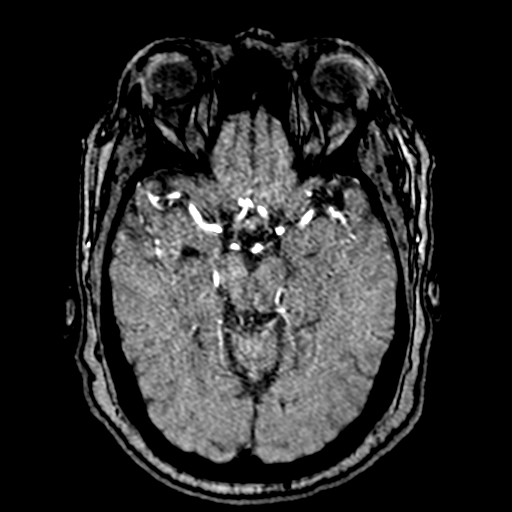
[im 96/172]
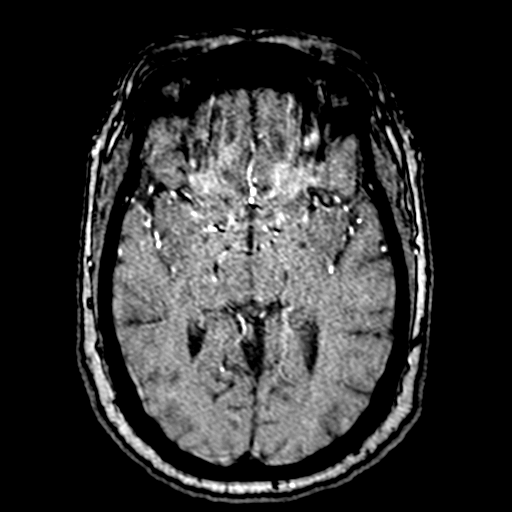
[im 117/172]
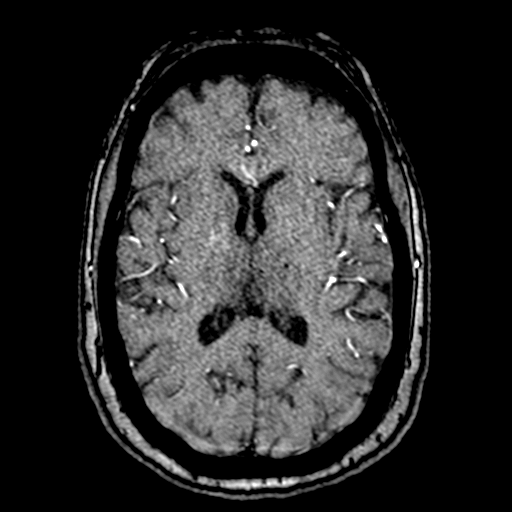
[im 144/172]
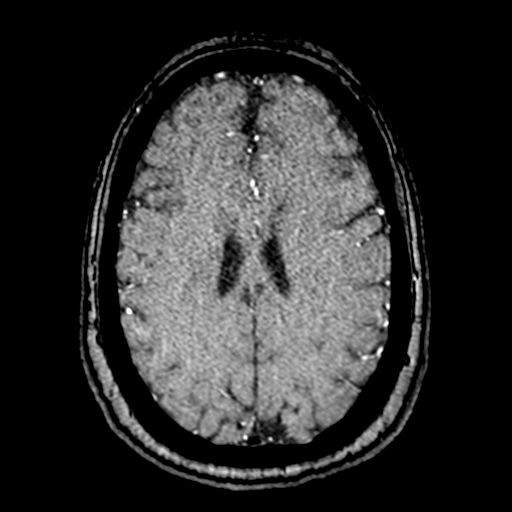
[im 165/172]
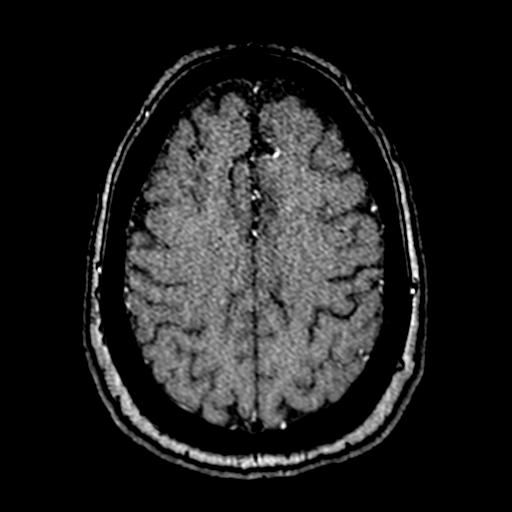

[38 of 48 positions shown; findings below may reference images not displayed]

FINDINGS: Anterior circulation: Examination moderately degraded by motion
artifact.

Petrous, cavernous, and supraclinoid segments of both internal
carotid arteries patent without flow-limiting stenosis. A1 segments
patent bilaterally. Normal anterior communicating artery complex.
Left ACA patent to its distal aspect without stenosis. There is an
apparent focal severe distal right A2 stenosis (series [GW], image
4). This is also seen on source time-of-flight sequence (series 5,
image 110). Right ACA otherwise patent. M1 segments patent without
significant stenosis. Negative MCA bifurcations. Distal MCA branches
perfused and symmetric. Suspected distal small vessel atheromatous
irregularity.

Posterior circulation: Visualized distal V4 segments patent without
stenosis. Right vertebral artery slightly dominant. Right PICA
origin patent. Left PICA origin not seen on this exam. Basilar
grossly patent to its distal aspect without significant stenosis.
Right SCA patent. There is question of a focal severe stenosis at
the origin of the left SCA (series [GW], image 4). Both PCAs
primarily supplied via the basilar. PCAs patent to their distal
aspects without significant stenosis.

Anatomic variants: None significant.

Other: No intracranial aneurysm or other vascular abnormality.
IMPRESSION: 1. Motion degraded exam.
2. Negative intracranial MRA for large vessel occlusion.
3. Severe right A2 stenosis.
4. Question additional severe stenosis at the origin of the left
SCA.
5. No other hemodynamically significant or correctable stenosis.

## 2021-04-05 IMAGING — DX DG CHEST 1V PORT
1 series · 2 of 2 positions shown · non-contrast
Comparison: None.

CLINICAL DATA: Pericardial effusion.

EXAM:
PORTABLE CHEST 1 VIEW

[Series 1: chest · 0.14mm/px · 2 of 2 slices shown]
[im 1/2]
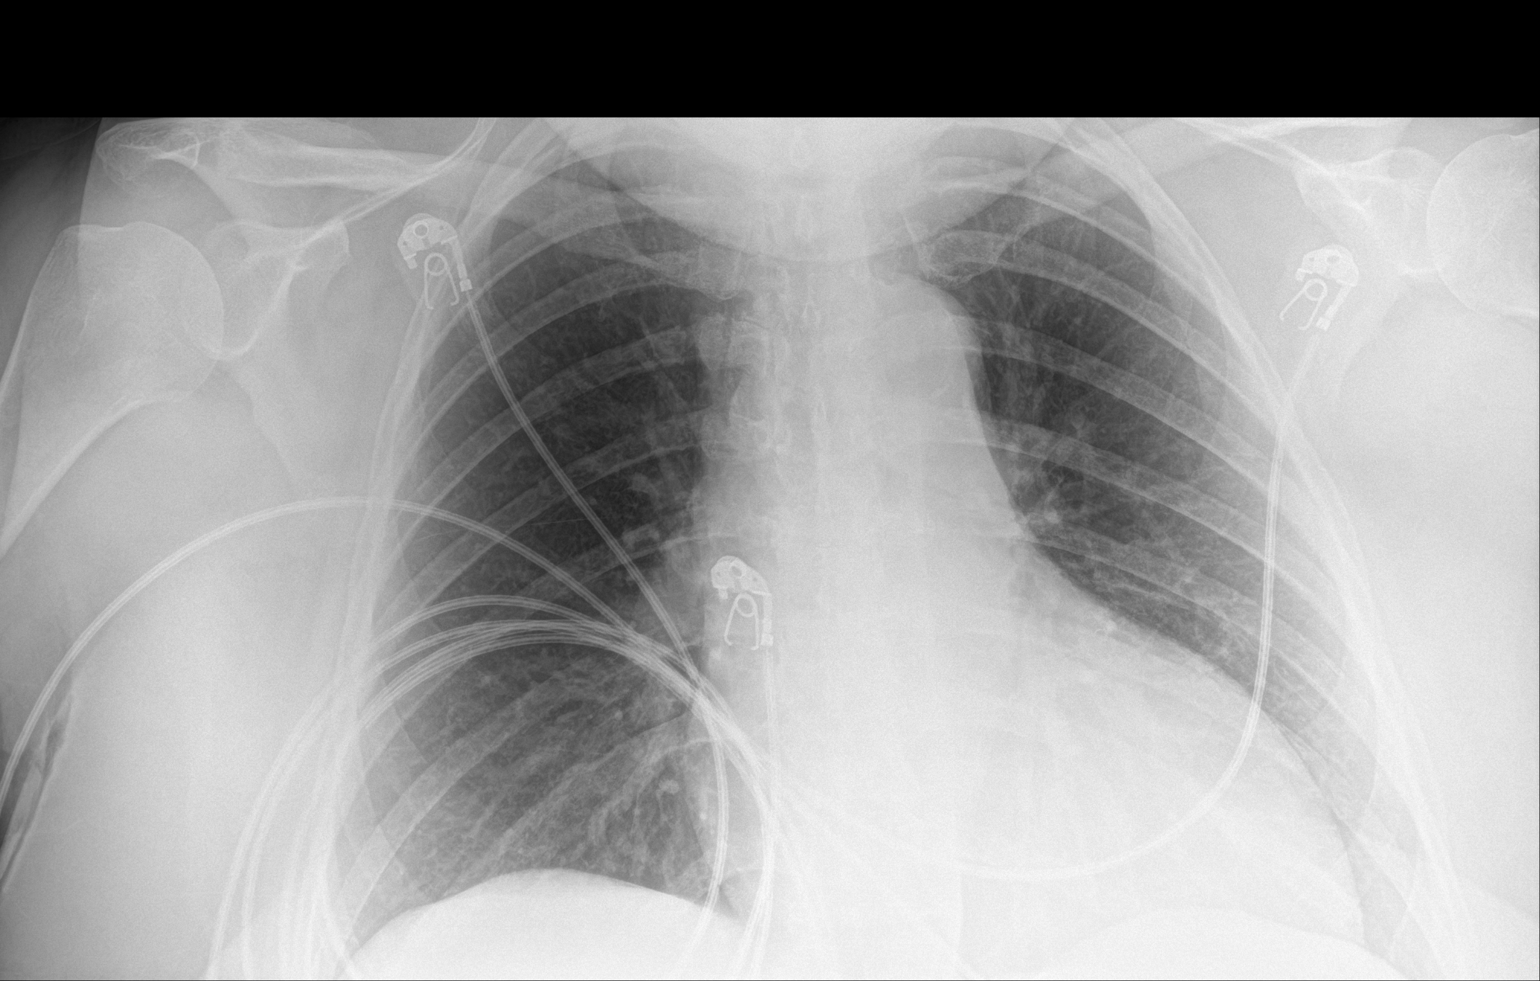
[im 2/2]
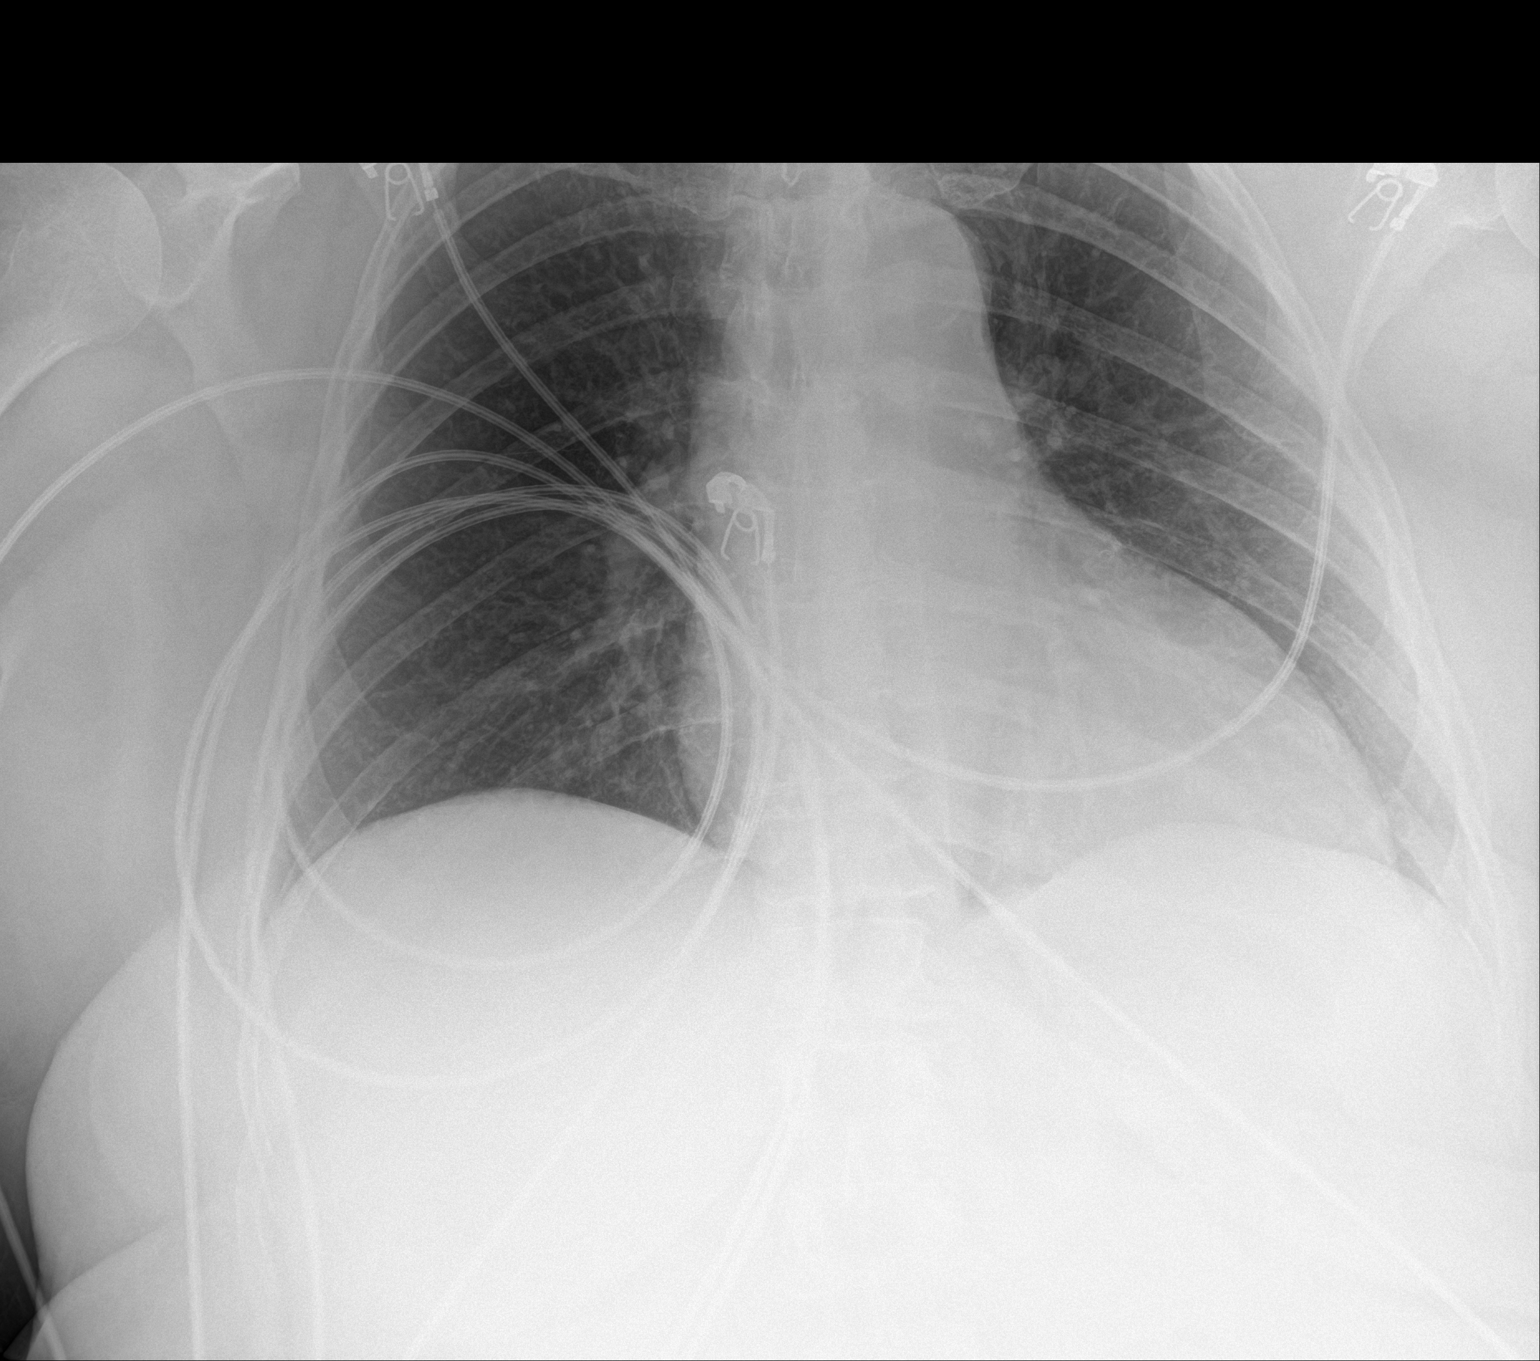

[2 of 2 positions shown; findings below may reference images not displayed]

FINDINGS: Cardiac and mediastinal contours are within normal limits. No focal
pulmonary opacity. No pleural effusion or pneumothorax. No acute
osseous abnormality.
IMPRESSION: No acute cardiopulmonary process. No definite evidence of
pericardial effusion.

## 2021-04-05 MED ORDER — STROKE: EARLY STAGES OF RECOVERY BOOK: Freq: Once | Status: DC

## 2021-04-05 MED ORDER — ACETAMINOPHEN 650 MG RE SUPP: 650.0000 mg | RECTAL | Status: DC | PRN

## 2021-04-05 MED ORDER — ATORVASTATIN CALCIUM 80 MG PO TABS
80.0000 mg | ORAL_TABLET | Freq: Every day | ORAL | Status: DC
  Administered 2021-04-05 – 2021-04-08 (×4): 80 mg via ORAL
  Filled 2021-04-05 (×4): qty 1

## 2021-04-05 MED ORDER — ACETAMINOPHEN 325 MG PO TABS
650.0000 mg | ORAL_TABLET | ORAL | Status: DC | PRN
  Administered 2021-04-05 – 2021-04-07 (×2): 650 mg via ORAL
  Filled 2021-04-05 (×2): qty 2

## 2021-04-05 MED ORDER — ASPIRIN EC 81 MG PO TBEC
81.0000 mg | DELAYED_RELEASE_TABLET | Freq: Every day | ORAL | Status: DC
  Administered 2021-04-06 – 2021-04-08 (×3): 81 mg via ORAL
  Filled 2021-04-05 (×3): qty 1

## 2021-04-05 MED ORDER — ENOXAPARIN SODIUM 60 MG/0.6ML IJ SOSY
50.0000 mg | PREFILLED_SYRINGE | INTRAMUSCULAR | Status: DC
  Administered 2021-04-05 – 2021-04-08 (×4): 50 mg via SUBCUTANEOUS
  Filled 2021-04-05: qty 0.6
  Filled 2021-04-05 (×2): qty 0.5
  Filled 2021-04-05 (×2): qty 0.6

## 2021-04-05 MED ORDER — ACETAMINOPHEN 160 MG/5ML PO SOLN: 650.0000 mg | ORAL | Status: DC | PRN

## 2021-04-05 MED ORDER — CLOPIDOGREL BISULFATE 75 MG PO TABS
75.0000 mg | ORAL_TABLET | Freq: Every day | ORAL | Status: DC
  Administered 2021-04-05 – 2021-04-08 (×4): 75 mg via ORAL
  Filled 2021-04-05 (×4): qty 1

## 2021-04-05 MED ORDER — SODIUM CHLORIDE 0.9 % IV SOLN: INTRAVENOUS | Status: DC

## 2021-04-05 MED ORDER — ASPIRIN 300 MG RE SUPP: 300.0000 mg | Freq: Every day | RECTAL | Status: DC

## 2021-04-05 MED ORDER — ASPIRIN 325 MG PO TABS
325.0000 mg | ORAL_TABLET | Freq: Every day | ORAL | Status: DC
  Administered 2021-04-05: 325 mg via ORAL
  Filled 2021-04-05: qty 1

## 2021-04-05 NOTE — ED Notes (Signed)
Pt alert, NAD, calm, interactive, resps e/u, speaking in clear complete sentences, swallow screen passed. Denies questions or needs. Admits to some sob. Denies pain, HA, weakness, or nausea.

## 2021-04-05 NOTE — H&P (Signed)
History and Physical    Christine Fritz ZOX:[id number] DOB: [birth year] DOA: 04/04/2021  PCP: Merri Brunette, MD  Patient coming from: Home.  Chief Complaint: Right-sided weakness.  HPI: Christine Fritz is a 60 y.o. female with no significant past medical history who has not been to a doctor for more than 20 years presents to the ER with complaints of right upper and lower extremity weakness and numbness which has been ongoing since Saturday, April 03, 2021.  Patient had gone to the urgent care yesterday and was referred to the ER.  Patient states she had a similar symptom about 2 weeks ago which only few hours and resolved without any intervention.  Denies any visual symptoms or difficulty speaking or swallowing.  ED Course: In the ER patient is found to have dysmetria and dysdiadochokinesia of the right upper extremity and lower extremity.  No facial asymmetry.  MRI of the brain shows acute CVA and on-call neurologist has been consulted admit for further work-up.  MRI of the C-spine also was done which shows some spinal stenosis and also occlusion of the right vertebral artery.  Labs show creatinine of 1.5 with do not have any old ones to compare.  EKG shows normal sinus rhythm and COVID test is pending.  Review of Systems: As per HPI, rest all negative.   History reviewed. No pertinent past medical history.  History reviewed. No pertinent surgical history.   reports that she has never smoked. She has never used smokeless tobacco. She reports that she does not currently use alcohol. She reports that she does not currently use drugs.  No Known Allergies  Family History  Problem Relation Age of Onset   Hypertension Father    Stroke Neg Hx     Prior to Admission medications   Medication Sig Start Date End Date Taking? Authorizing Provider  Chlorphen-Pseudoephed-APAP (TYLENOL ALLERGY SINUS PO) Take 2 tablets by mouth daily.   Yes [provider]  magnesium oxide (MAG-OX) 400  MG tablet Take 400 mg by mouth daily.   Yes [provider]    Physical Exam: Constitutional: Moderately built and nourished. Vitals:   04/04/21 2230 04/05/21 0019 04/05/21 0030 04/05/21 0045  BP: (!) 145/84 (!) 148/101 131/89 (!) 155/109  Pulse: 75 84 81 84  Resp: 13 15 12 19   Temp:      TempSrc:      SpO2: 99% 97% 94% 96%  Weight:      Height:       Eyes: Anicteric no pallor. ENMT: No discharge from the ears eyes nose and mouth. Neck: No mass felt.  No neck rigidity. Respiratory: No rhonchi or crepitations. Cardiovascular: S1-S2 heard. Abdomen: Soft nontender bowel sound present. Musculoskeletal: No edema. Skin: No rash. Neurologic: Alert awake oriented to time place and person.  Right upper extremity has mild dysdiadochokinesia and right lower extremity also has some ataxic signs.  Left upper and lower extremities are 5 x 5.  No facial asymmetry.  Tongue is midline.  Pupils equal reacting to light. Psychiatric: Appears normal.  Normal affect.   Labs on Admission: I have personally reviewed following labs and imaging studies  CBC: Recent Labs  Lab 04/04/21 1630 04/04/21 1647  WBC 7.6  --   NEUTROABS 5.2  --   HGB 14.0 14.6  HCT 43.4 43.0  MCV 92.1  --   PLT 252  --    Basic Metabolic Panel: Recent Labs  Lab 04/04/21 1630 04/04/21 1647  NA 138  139  K 4.0 4.0  CL 99 100  CO2 28  --   GLUCOSE 97 93  BUN 10 11  CREATININE 1.59* 1.60*  CALCIUM 9.6  --    GFR: Estimated Creatinine Clearance: 46.4 mL/min (A) (by C-G formula based on SCr of 1.6 mg/dL (H)). Liver Function Tests: Recent Labs  Lab 04/04/21 1630  AST 27  ALT 14  ALKPHOS 57  BILITOT 0.8  PROT 7.7  ALBUMIN 4.4   No results for input(s): LIPASE, AMYLASE in the last 168 hours. No results for input(s): AMMONIA in the last 168 hours. Coagulation Profile: Recent Labs  Lab 04/04/21 1630  INR 1.0   Cardiac Enzymes: No results for input(s): CKTOTAL, CKMB, CKMBINDEX, TROPONINI in the  last 168 hours. BNP (last 3 results) No results for input(s): PROBNP in the last 8760 hours. HbA1C: No results for input(s): HGBA1C in the last 72 hours. CBG: Recent Labs  Lab 04/04/21 1633  GLUCAP 101*   Lipid Profile: No results for input(s): CHOL, HDL, LDLCALC, TRIG, CHOLHDL, LDLDIRECT in the last 72 hours. Thyroid Function Tests: No results for input(s): TSH, T4TOTAL, FREET4, T3FREE, THYROIDAB in the last 72 hours. Anemia Panel: No results for input(s): VITAMINB12, FOLATE, FERRITIN, TIBC, IRON, RETICCTPCT in the last 72 hours. Urine analysis: No results found for: COLORURINE, APPEARANCEUR, LABSPEC, PHURINE, GLUCOSEU, HGBUR, BILIRUBINUR, KETONESUR, PROTEINUR, UROBILINOGEN, NITRITE, LEUKOCYTESUR Sepsis Labs: @LABRCNTIP (procalcitonin:4,lacticidven:4) )No results found for this or any previous visit (from the past 240 hour(s)).   Radiological Exams on Admission: CT HEAD WO CONTRAST  Result Date: 04/04/2021 CLINICAL DATA:  Neuro deficit, acute, stroke suspected. Right-sided weakness. EXAM: CT HEAD WITHOUT CONTRAST TECHNIQUE: Contiguous axial images were obtained from the base of the skull through the vertex without intravenous contrast. COMPARISON:  None. 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 Mild chronic small vessel disease throughout the deep white matter. No hemorrhage or hydrocephalus. Vascular: No hyperdense vessel or unexpected calcification. Skull: No acute calvarial abnormality. 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 Chronic small vessel disease. Electronically Signed   By: 04/06/2021 M.D.   On: 04/04/2021 18:36   MR BRAIN WO CONTRAST  Result Date: 04/05/2021 CLINICAL DATA:  Initial evaluation for acute stroke, right-sided paresthesias for 24 hours. EXAM: MRI HEAD WITHOUT CONTRAST TECHNIQUE: Multiplanar, multiecho pulse sequences of the brain and surrounding  structures were obtained without intravenous contrast. COMPARISON:  Head CT from earlier the same day. FINDINGS: Brain: Cerebral volume within normal limits. Mild chronic microvascular ischemic disease noted involving the supratentorial cerebral white matter. Remote lacunar infarct at the right basal ganglia/corona radiata. 1 cm focus of restricted diffusion seen involving the posterior limb of the left internal capsule, consistent with a acute ischemic infarct (series 5, image 72). No associated hemorrhage or mass effect. No other evidence for acute or subacute ischemia. Gray-white matter differentiation otherwise maintained. No acute intracranial hemorrhage. Few small chronic micro hemorrhages noted involving the left thalamus, likely hypertensive in nature. No mass lesion or mass effect. No midline shift or hydrocephalus. No extra-axial fluid collection. Pituitary gland suprasellar region within normal limits. Midline structures intact. Vascular: Abnormal flow void within the distal right vertebral artery within the neck, likely occluded (series 10, image 1). Preserved but mildly heterogeneous flow seen within the right V4 segment distally, which could be retrograde in nature. Major intracranial vascular flow voids otherwise maintained. Skull and upper cervical spine: Craniocervical junction within normal limits. Bone marrow signal intensity normal. No scalp  soft tissue abnormality. Sinuses/Orbits: Globes normal soft tissues demonstrate no acute finding. Paranasal sinuses are largely clear. No significant mastoid effusion. Other: None. IMPRESSION: 1. 1 cm acute ischemic nonhemorrhagic infarct involving the posterior limb of the left internal capsule. 2. Remote lacunar infarct at the right basal ganglia/corona radiata. 3. Underlying mild chronic microvascular ischemic disease. 4. Absent flow void within the right vertebral artery within the neck, likely occluded. Electronically Signed   By: Rise Mu  M.D.   On: 04/05/2021 01:06   MR Cervical Spine Wo Contrast  Result Date: 04/05/2021 CLINICAL DATA:  Initial evaluation for cervical radiculopathy, right arm and leg paresthesias. EXAM: MRI CERVICAL SPINE WITHOUT CONTRAST TECHNIQUE: Multiplanar, multisequence MR imaging of the cervical spine was performed. No intravenous contrast was administered. COMPARISON:  None available. FINDINGS: Alignment: Straightening with slight reversal of the normal cervical lordosis. No listhesis. Vertebrae: Vertebral body height maintained without acute or chronic fracture. Bone marrow signal intensity within normal limits. No discrete or worrisome osseous lesions. No abnormal marrow edema. Cord: Normal signal and morphology. Posterior Fossa, vertebral arteries, paraspinal tissues: Craniocervical junction within normal limits. Paraspinous soft tissues demonstrate no acute finding. 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 Disc levels: C2-C3: Negative interspace. Mild left-sided facet hypertrophy. No stenosis. C3-C4: Mild endplate spurring without significant disc bulge. Mild facet hypertrophy. No spinal stenosis. Mild bilateral C4 foraminal narrowing. C4-C5: Mild disc bulge with uncovertebral hypertrophy. Right-sided facet degeneration. No spinal stenosis. Mild left C5 foraminal narrowing. Right neural foramina remains patent. C5-C6: Degenerative intervertebral disc space narrowing. Left paracentral disc protrusion indents the left ventral thecal sac, contacting and mildly flattening the ventral cord (series 8, image 29). Mild spinal stenosis. Superimposed uncovertebral spurring with resultant mild right C6 foraminal narrowing. Left neural foramina remains patent. C6-C7: Degenerative intervertebral disc space narrowing with diffuse disc bulge, endplate spurring, and uncovertebral hypertrophy. Broad posterior disc osteophyte flattens and partially faces the ventral thecal sac with resultant mild spinal  stenosis. Foramina remain patent. C7-T1: Negative interspace. Left greater than right facet hypertrophy. No spinal stenosis. Mild left C8 foraminal narrowing. Right neural foramina remains patent. Visualized upper thoracic spine demonstrates no significant finding. IMPRESSION: 1. Left paracentral disc protrusion at C5-6 contacting and mildly flattening the left hemi cord and resulting in mild spinal stenosis. 2. Degenerative disc osteophyte at C6-7 with resultant mild spinal stenosis. 3. Mild bilateral C4, left C5, right C6, and left C8 foraminal stenosis related to disc bulging and uncovertebral disease. 4. 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 Electronically Signed   By: Rise Mu M.D.   On: 04/05/2021 01:24    EKG: Independently reviewed.  Normal sinus rhythm.  Assessment/Plan Principal Problem:   Acute CVA (cerebrovascular accident) (HCC)    Acute CVA -discussed with on-call neurologist Dr. Darlina Rumpf.  Did pass stroke swallow.  We will keep patient on neurochecks check hemoglobin A1c lipid panel 2D echo carotid Doppler MRA head.  Physical therapy consult.  Aspirin.  Allow for permissive hypertension. Acute renal failure with no old labs to compare.  Closely follow metabolic panel.  COVID test is pending.   DVT prophylaxis: Lovenox. Code Status: Full code. Family Communication: Discussed with patient. Disposition Plan: Home. Consults called: Neurology. Admission status: Observation.   Eduard Clos MD [facility] Pager [phone].  If 7PM-7AM, please contact night-coverage [email or web address] Password [email or web address]  04/05/2021, 2:10 AM

## 2021-04-05 NOTE — ED Notes (Signed)
Vasc US at BS ?

## 2021-04-05 NOTE — ED Notes (Addendum)
This tech found patient on the floor after an unwitnessed fall. Pt stated she "sorta slid" and didn't feel any pain or injuries, but that she could feel her legs. Pt assisted into bed by this tech and Rns.

## 2021-04-05 NOTE — ED Notes (Signed)
Pt transported to MRI 

## 2021-04-05 NOTE — ED Notes (Signed)
Admitting at bedside 

## 2021-04-05 NOTE — ED Notes (Signed)
Pt and mother at [facility] updated.

## 2021-04-05 NOTE — Progress Notes (Addendum)
Patient with a STROKE TEAM PROGRESS NOTE   ATTENDING NOTE: I reviewed above note and agree with the assessment and plan. Pt was seen and examined.   60 year old female with history of morbid obesity, not seeing doctors admitted for right upper and lower extremity incoordination.  She had a similar symptoms 1 week ago, not able to get up from floor for 4 hours and resolved.  This time symptoms persistent.  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  MRI showed right A2 severe stenosis, left ICA severe stenosis.  Carotid Doppler negative, EF 60 to 65%.  UDS negative, LDL 2059, A1c pending, creatinine 1.56.  On exam, husband at the bedside, pt awake, alert, eyes open, orientated to age, place, time and people. No aphasia, fluent language, following all simple commands. Able to name and repeat and read. No gaze palsy, tracking bilaterally, visual field full, PERRL. No facial droop. Tongue midline. LUE and LLE 5/5, RUE pronator drift and decreased dexterity of right hand. Sensation symmetrical bilaterally, right FTN ataxia out of proportion to the weakness, gait not tested.   Etiology for patient symptoms likely due to small vessel disease given uncontrolled risk factors including morbid obesity, severe hyperlipidemia and significant hypertension.  Recommend aggressive risk factor modification.  Continue aspirin 81 and Plavix 75 DAPT for 3 weeks and then aspirin alone.  Continue Lipitor 80 on discharge.  PT/OT recommend CIR.  For detailed assessment and plan, please refer to above as I have made changes wherever appropriate.   Neurology will sign off. Please call with questions. Pt will follow up with stroke clinic NP at [facility] in about 4 weeks. Thanks for the consult.   Marvel Plan, MD PhD Stroke Neurology 04/05/2021 5:40 PM    INTERVAL HISTORY Her mom is at the bedside.  She is lying in the bed in NAD. She reports no new symptoms. C/o tingling in right leg. Discussed images and  test results and plan of care moving forward. All questions answered and verbalized understanding  Vitals:   04/05/21 1400 04/05/21 1415 04/05/21 1430 04/05/21 1435  BP:    (!) 148/101  Pulse: 86 80 79 81  Resp: 14 11 11 11   Temp:      TempSrc:      SpO2: 95% 92% 94% 93%  Weight:      Height:       CBC:  Recent Labs  Lab 04/04/21 1630 04/04/21 1647 04/05/21 0457  WBC 7.6  --  9.0  NEUTROABS 5.2  --   --   HGB 14.0 14.6 14.1  HCT 43.4 43.0 42.7  MCV 92.1  --  91.4  PLT 252  --  236   Basic Metabolic Panel:  Recent Labs  Lab 04/04/21 1630 04/04/21 1647 04/05/21 0457  NA 138 139  --   K 4.0 4.0  --   CL 99 100  --   CO2 28  --   --   GLUCOSE 97 93  --   BUN 10 11  --   CREATININE 1.59* 1.60* 1.56*  CALCIUM 9.6  --   --    Lipid Panel:  Recent Labs  Lab 04/05/21 0457  CHOL 353*  TRIG 227*  HDL 49  CHOLHDL 7.2  VLDL 45*  LDLCALC 259*   HgbA1c: No results for input(s): HGBA1C in the last 168 hours. Urine Drug Screen:  Recent Labs  Lab 04/05/21 1113  LABOPIA NONE DETECTED  COCAINSCRNUR NONE DETECTED  LABBENZ NONE DETECTED  AMPHETMU NONE DETECTED  THCU NONE DETECTED  LABBARB NONE DETECTED    Alcohol Level No results for input(s): ETH in the last 168 hours.  IMAGING past 24 hours DG Pelvis 1-2 Views  Result Date: 04/05/2021 CLINICAL DATA:  Fall EXAM: PELVIS - 1-2 VIEW COMPARISON:  None. FINDINGS: There is no evidence of pelvic fracture or diastasis. No pelvic bone lesions are seen. IMPRESSION: Negative Electronically Signed   By: Tiburcio Pea M.D.   On: 04/05/2021 06:01   CT HEAD WO CONTRAST  Result Date: 04/04/2021 CLINICAL DATA:  Neuro deficit, acute, stroke suspected. Right-sided weakness. EXAM: CT HEAD WITHOUT CONTRAST TECHNIQUE: Contiguous axial images were obtained from the base of the skull through the vertex without intravenous contrast. COMPARISON:  None. 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 Mild  chronic small vessel disease throughout the deep white matter. No hemorrhage or hydrocephalus. Vascular: No hyperdense vessel or unexpected calcification. Skull: No acute calvarial abnormality. 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 Chronic small vessel disease. Electronically Signed   By: Charlett Nose M.D.   On: 04/04/2021 18:36   MR ANGIO HEAD WO CONTRAST  Result Date: 04/05/2021 CLINICAL DATA:  Follow-up examination for acute stroke. EXAM: MRA HEAD WITHOUT CONTRAST TECHNIQUE: Angiographic images of the Circle of Willis were acquired using MRA technique without intravenous contrast. COMPARISON:  Prior brain MRI from 04/04/2021 FINDINGS: Anterior circulation: Examination moderately degraded by motion artifact. Petrous, cavernous, and supraclinoid segments of both internal carotid arteries patent without flow-limiting stenosis. A1 segments patent bilaterally. Normal anterior communicating artery complex. Left ACA patent to its distal aspect without stenosis. There is an apparent focal severe distal right A2 stenosis (series 1063, image 4). This is also seen on source time-of-flight sequence (series 5, image 110). Right ACA otherwise patent. M1 segments patent without significant stenosis. Negative MCA bifurcations. Distal MCA branches perfused and symmetric. Suspected distal small vessel atheromatous irregularity. Posterior circulation: Visualized distal V4 segments patent without stenosis. Right vertebral artery slightly dominant. Right PICA origin patent. Left PICA origin not seen on this exam. Basilar grossly patent to its distal aspect without significant stenosis. Right SCA patent. There is question of a focal severe stenosis at the origin of the left SCA (series 1051, image 4). Both PCAs primarily supplied via the basilar. PCAs patent to their distal aspects without significant stenosis. Anatomic variants: None significant. Other:  No intracranial aneurysm or other vascular abnormality. IMPRESSION: 1. Motion degraded exam. 2. Negative intracranial MRA for large vessel occlusion. 3. Severe right A2 stenosis. 4. Question additional severe stenosis at the origin of the left SCA. 5. No other hemodynamically significant or correctable stenosis. Electronically Signed   By: Rise Mu M.D.   On: 04/05/2021 04:25   MR BRAIN WO CONTRAST  Result Date: 04/05/2021 CLINICAL DATA:  Initial evaluation for acute stroke, right-sided paresthesias for 24 hours. EXAM: MRI HEAD WITHOUT CONTRAST TECHNIQUE: Multiplanar, multiecho pulse sequences of the brain and surrounding structures were obtained without intravenous contrast. COMPARISON:  Head CT from earlier the same day. FINDINGS: Brain: Cerebral volume within normal limits. Mild chronic microvascular ischemic disease noted involving the supratentorial cerebral white matter. Remote lacunar infarct at the right basal ganglia/corona radiata. 1 cm focus of restricted diffusion seen involving the posterior limb of the left internal capsule, consistent with a acute ischemic infarct (series 5, image 72). No associated hemorrhage or mass effect. No other evidence for acute or subacute ischemia. Gray-white matter differentiation otherwise maintained. No  acute intracranial hemorrhage. Few small chronic micro hemorrhages noted involving the left thalamus, likely hypertensive in nature. No mass lesion or mass effect. No midline shift or hydrocephalus. No extra-axial fluid collection. Pituitary gland suprasellar region within normal limits. Midline structures intact. Vascular: Abnormal flow void within the distal right vertebral artery within the neck, likely occluded (series 10, image 1). Preserved but mildly heterogeneous flow seen within the right V4 segment distally, which could be retrograde in nature. Major intracranial vascular flow voids otherwise maintained. Skull and upper cervical spine:  Craniocervical junction within normal limits. Bone marrow signal intensity normal. No scalp soft tissue abnormality. Sinuses/Orbits: Globes normal soft tissues demonstrate no acute finding. Paranasal sinuses are largely clear. No significant mastoid effusion. Other: None. IMPRESSION: 1. 1 cm acute ischemic nonhemorrhagic infarct involving the posterior limb of the left internal capsule. 2. Remote lacunar infarct at the right basal ganglia/corona radiata. 3. Underlying mild chronic microvascular ischemic disease. 4. Absent flow void within the right vertebral artery within the neck, likely occluded. Electronically Signed   By: Rise Mu M.D.   On: 04/05/2021 01:06   MR Cervical Spine Wo Contrast  Result Date: 04/05/2021 CLINICAL DATA:  Initial evaluation for cervical radiculopathy, right arm and leg paresthesias. EXAM: MRI CERVICAL SPINE WITHOUT CONTRAST TECHNIQUE: Multiplanar, multisequence MR imaging of the cervical spine was performed. No intravenous contrast was administered. COMPARISON:  None available. FINDINGS: Alignment: Straightening with slight reversal of the normal cervical lordosis. No listhesis. Vertebrae: Vertebral body height maintained without acute or chronic fracture. Bone marrow signal intensity within normal limits. No discrete or worrisome osseous lesions. No abnormal marrow edema. Cord: Normal signal and morphology. Posterior Fossa, vertebral arteries, paraspinal tissues: Craniocervical junction within normal limits. Paraspinous soft tissues demonstrate no acute finding. 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 Disc levels: C2-C3: Negative interspace. Mild left-sided facet hypertrophy. No stenosis. C3-C4: Mild endplate spurring without significant disc bulge. Mild facet hypertrophy. No spinal stenosis. Mild bilateral C4 foraminal narrowing. C4-C5: Mild disc bulge with uncovertebral hypertrophy. Right-sided facet degeneration. No spinal stenosis. Mild  left C5 foraminal narrowing. Right neural foramina remains patent. C5-C6: Degenerative intervertebral disc space narrowing. Left paracentral disc protrusion indents the left ventral thecal sac, contacting and mildly flattening the ventral cord (series 8, image 29). Mild spinal stenosis. Superimposed uncovertebral spurring with resultant mild right C6 foraminal narrowing. Left neural foramina remains patent. C6-C7: Degenerative intervertebral disc space narrowing with diffuse disc bulge, endplate spurring, and uncovertebral hypertrophy. Broad posterior disc osteophyte flattens and partially faces the ventral thecal sac with resultant mild spinal stenosis. Foramina remain patent. C7-T1: Negative interspace. Left greater than right facet hypertrophy. No spinal stenosis. Mild left C8 foraminal narrowing. Right neural foramina remains patent. Visualized upper thoracic spine demonstrates no significant finding. IMPRESSION: 1. Left paracentral disc protrusion at C5-6 contacting and mildly flattening the left hemi cord and resulting in mild spinal stenosis. 2. Degenerative disc osteophyte at C6-7 with resultant mild spinal stenosis. 3. Mild bilateral C4, left C5, right C6, and left C8 foraminal stenosis related to disc bulging and uncovertebral disease. 4. 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 Electronically Signed   By: Rise Mu M.D.   On: 04/05/2021 01:24   DG CHEST PORT 1 VIEW  Result Date: 04/05/2021 CLINICAL DATA:  Pericardial effusion. EXAM: PORTABLE CHEST 1 VIEW COMPARISON:  None. FINDINGS: Cardiac and mediastinal contours are within normal limits. No focal pulmonary opacity. No pleural effusion or pneumothorax. No acute osseous abnormality.  IMPRESSION: No acute cardiopulmonary process. No definite evidence of pericardial effusion. Electronically Signed   By: Wiliam Ke M.D.   On: 04/05/2021 14:57   ECHOCARDIOGRAM COMPLETE  Result Date: 04/05/2021     ECHOCARDIOGRAM REPORT   Patient Name:   Christine Fritz Date of Exam: 04/05/2021 Medical Rec #:  [id number]     Height:       64.0 in Accession #:    [id number]    Weight:       247.0 lb Date of Birth:  1961-01-30      BSA:          2.140 m Patient Age:    60 years      BP:           155/109 mmHg Patient Gender: F             HR:           81 bpm. Exam Location:  Inpatient Procedure: 2D Echo, Cardiac Doppler and Color Doppler Indications:    Stroke  History:        Patient has no prior history of Echocardiogram examinations.                 Acute CVA.  Sonographer:    Vanetta Shawl Referring Phys: 25 Eduard Clos  Sonographer Comments: Technically difficult study due to poor echo windows. IMPRESSIONS  1. Left ventricular ejection fraction, by estimation, is 60 to 65%. The left ventricle has normal function. The left ventricle has no regional wall motion abnormalities. There is severe concentric left ventricular hypertrophy. Left ventricular diastolic  parameters are indeterminate.  2. Right ventricular systolic function is normal. The right ventricular size is normal. Moderately increased right ventricular wall thickness.  3. Moderate pericardial effusion, located anterior of the right venticle. The right ventricle does not expand fully suggesting increased pericardial pressure but there is no evidence of tamponade. 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  4. The mitral valve is normal in structure. No evidence of mitral valve regurgitation. No evidence of mitral stenosis.  5. The aortic valve is normal in structure. Aortic valve regurgitation is not visualized. No aortic stenosis is present.  6. The inferior vena cava is normal in size with greater than 50% respiratory variability, suggesting right atrial pressure of 3 mmHg. Conclusion(s)/Recommendation(s): Due to findings of Moderate pericardial effusion with no evidence of tamponande, a follow up  echocardiogram is recommended in prior to discharge. FINDINGS  Left Ventricle: Left ventricular ejection fraction, by estimation, is 60 to 65%. The left ventricle has normal function. The left ventricle has no regional wall motion abnormalities. The left ventricular internal cavity size was normal in size. There is  severe concentric left ventricular hypertrophy. Left ventricular diastolic parameters are indeterminate. Right Ventricle: The right ventricular size is normal. Moderately increased right ventricular wall thickness. Right ventricular systolic function is normal. Left Atrium: Left atrial size was normal in size. Right Atrium: Right atrial size was normal in size. Pericardium: The right ventricle does not expand fully suggesting increased pericardial pressure but there is no evidence of tamponade. 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 A moderately sized pericardial effusion is present. The pericardial effusion is anterior to the right ventricle. Presence of epicardial fat layer. Mitral Valve: The mitral valve is normal in structure. No evidence of mitral valve regurgitation. No evidence of mitral valve stenosis. Tricuspid Valve: The  tricuspid valve is normal in structure. Tricuspid valve regurgitation is not demonstrated. No evidence of tricuspid stenosis. Aortic Valve: The aortic valve is normal in structure. Aortic valve regurgitation is not visualized. No aortic stenosis is present. Aortic valve mean gradient measures 4.0 mmHg. Aortic valve peak gradient measures 6.7 mmHg. Aortic valve area, by VTI measures 2.53 cm. Pulmonic Valve: The pulmonic valve was normal in structure. Pulmonic valve regurgitation is not visualized. No evidence of pulmonic stenosis. Aorta: The aortic root is normal in size and structure. Venous: The inferior vena cava is normal in size with greater than 50% respiratory variability, suggesting right atrial pressure of 3 mmHg.  IAS/Shunts: No atrial level shunt detected by color flow Doppler.  LEFT VENTRICLE PLAX 2D LVIDd:         3.50 cm   Diastology LVIDs:         2.50 cm   LV e' medial:    5.33 cm/s LV PW:         1.70 cm   LV E/e' medial:  11.7 LV IVS:        1.70 cm   LV e' lateral:   3.81 cm/s LVOT diam:     1.90 cm   LV E/e' lateral: 16.4 LV SV:         68 LV SV Index:   32 LVOT Area:     2.84 cm  IVC IVC diam: 1.60 cm LEFT ATRIUM             Index LA diam:        3.40 cm 1.59 cm/m LA Vol (A2C):   38.8 ml 18.13 ml/m LA Vol (A4C):   33.3 ml 15.56 ml/m LA Biplane Vol: 37.6 ml 17.57 ml/m  AORTIC VALVE                    PULMONIC VALVE AV Area (Vmax):    3.03 cm     PV Vmax:       1.22 m/s AV Area (Vmean):   2.74 cm     PV Peak grad:  6.0 mmHg AV Area (VTI):     2.53 cm AV Vmax:           129.00 cm/s AV Vmean:          91.400 cm/s AV VTI:            0.268 m AV Peak Grad:      6.7 mmHg AV Mean Grad:      4.0 mmHg LVOT Vmax:         138.00 cm/s LVOT Vmean:        88.200 cm/s LVOT VTI:          0.239 m LVOT/AV VTI ratio: 0.89  AORTA Ao Root diam: 3.20 cm Ao Asc diam:  3.30 cm MITRAL VALVE MV Area (PHT): 4.83 cm    SHUNTS MV Decel Time: 157 msec    Systemic VTI:  0.24 m MV E velocity: 62.40 cm/s  Systemic Diam: 1.90 cm MV A velocity: 84.80 cm/s MV E/A ratio:  0.74 Kardie Tobb DO Electronically signed by Thomasene Ripple DO Signature Date/Time: 04/05/2021/12:48:59 PM    Final    VAS US CAROTID (at [facility] and [facility] only)  Result Date: 04/05/2021 Carotid Arterial Duplex Study Patient Name:  KAYJA BARES  Date of Exam:   04/05/2021 Medical Rec #: [id number]      Accession #:    [id number] Date of Birth: Oct 03, 1960  Patient Gender: F Patient Age:   84 years Exam Location:  [facility] Procedure:      VAS US CAROTID Referring Phys: Midge Minium --------------------------------------------------------------------------------  Indications:       CVA. Comparison Study:  no prior Performing Technologist: [place] Ponder RVS   Examination Guidelines: A complete evaluation includes B-mode imaging, spectral Doppler, color Doppler, and power Doppler as needed of all accessible portions of each vessel. Bilateral testing is considered an integral part of a complete examination. Limited examinations for reoccurring indications may be performed as noted.  Right Carotid Findings: +----------+--------+--------+--------+------------------+--------+           PSV cm/sEDV cm/sStenosisPlaque DescriptionComments +----------+--------+--------+--------+------------------+--------+ CCA Prox  50      12              heterogenous               +----------+--------+--------+--------+------------------+--------+ CCA Distal65      14              heterogenous               +----------+--------+--------+--------+------------------+--------+ ICA Prox  51      18      1-39%   heterogenous               +----------+--------+--------+--------+------------------+--------+ ICA Distal87      26                                         +----------+--------+--------+--------+------------------+--------+ ECA       261     17                                         +----------+--------+--------+--------+------------------+--------+ +----------+--------+-------+--------+-------------------+           PSV cm/sEDV cmsDescribeArm Pressure (mmHG) +----------+--------+-------+--------+-------------------+ [id number]                                        +----------+--------+-------+--------+-------------------+ +---------+--------+--+--------+-+---------+ VertebralPSV cm/s37EDV cm/s9Antegrade +---------+--------+--+--------+-+---------+  Left Carotid Findings: +----------+--------+--------+--------+------------------+--------+           PSV cm/sEDV cm/sStenosisPlaque DescriptionComments +----------+--------+--------+--------+------------------+--------+ CCA Prox  87      13              heterogenous                +----------+--------+--------+--------+------------------+--------+ CCA Distal67      12              heterogenous               +----------+--------+--------+--------+------------------+--------+ ICA Prox  44      14      1-39%   heterogenous               +----------+--------+--------+--------+------------------+--------+ ICA Distal87      26                                         +----------+--------+--------+--------+------------------+--------+ ECA       81      6                                          +----------+--------+--------+--------+------------------+--------+ +----------+--------+--------+--------+-------------------+  PSV cm/sEDV cm/sDescribeArm Pressure (mmHG) +----------+--------+--------+--------+-------------------+ [id number]                                         +----------+--------+--------+--------+-------------------+ +---------+--------+--+--------+--+---------+ VertebralPSV cm/s39EDV cm/s13Antegrade +---------+--------+--+--------+--+---------+   Summary: Right Carotid: Velocities in the right ICA are consistent with a 1-39% stenosis. Left Carotid: Velocities in the left ICA are consistent with a 1-39% stenosis. Vertebrals: Bilateral vertebral arteries demonstrate antegrade flow. *See table(s) above for measurements and observations.     Preliminary     PHYSICAL EXAM  Temp:  [97.6 F (36.4 C)-98.2 F (36.8 C)] 97.6 F (36.4 C) (11/14 0449) Pulse Rate:  [71-91] 81 (11/14 1435) Resp:  [8-20] 11 (11/14 1435) BP: (95-193)/(55-149) 148/101 (11/14 1435) SpO2:  [89 %-99 %] 93 % (11/14 1435) Weight:  [607 kg] 112 kg (11/13 1937)  General - Well nourished, well developed, in no apparent distress.  Ophthalmologic - fundi not visualized due to noncooperation.  Cardiovascular - Regular rhythm and rate.  Mental Status -  Level of arousal and orientation to time, place, and person were intact. Language  including expression, naming, repetition, comprehension was assessed and found intact. Attention span and concentration were normal. Recent and remote memory were intact. Fund of Knowledge was assessed and was intact.  Cranial Nerves II - XII - II - Visual field intact OU. III, IV, VI - Extraocular movements intact. V - Facial sensation intact bilaterally. VII - Facial movement intact bilaterally. VIII - Hearing & vestibular intact bilaterally. X - Palate elevates symmetrically. XI - Chin turning & shoulder shrug intact bilaterally. XII - Tongue protrusion intact.  Motor Strength - Right upper and lower drift, 4/5, left upper and lower 5/5.   Bulk was normal and fasciculations were absent.   Motor Tone - Muscle tone was assessed at the neck and appendages and was normal.  Sensory - Light touch, temperature/pinprick were assessed and were symmetrical.    Coordination - The patient had normal movements in the hands and feet with no ataxia or dysmetria.  Tremor was absent.  Gait and Station - deferred.   ASSESSMENT/PLAN Christine Fritz is a 60 y.o. female with history of  morbid obesity and no significant past medical history who has not been to a doctor for more than 20 years presents to the ER with complaints of right upper and lower extremity weakness and numbness which has been ongoing since Saturday, April 03, 2021.  Patient had gone to the urgent care yesterday and was referred to the ER.  Patient states she had a similar symptom about 2 weeks ago which only few hours and resolved without any intervention.  Denies any visual symptoms or difficulty speaking or swallowing.  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 Mild chronic small vessel disease throughout the deep white matter. No hemorrhage or hydrocephalus  MRI   1. 1 cm acute ischemic  nonhemorrhagic infarct involving the posterior limb of the left internal capsule. 2. Remote lacunar infarct at the right basal ganglia/corona radiata. 3. Underlying mild chronic microvascular ischemic disease. 4. Absent flow void within the right vertebral artery within the neck, likely occluded.  MRA   1. Motion degraded exam. 2. Negative intracranial MRA for large vessel occlusion. 3. Severe right A2 stenosis. 4. Question additional severe stenosis at the  origin of the left SCA. 5. No other hemodynamically significant or correctable stenosis.   Carotid Doppler   Right Carotid: Velocities in the right ICA are consistent with a 1-39% stenosis.   Left Carotid: Velocities in the left ICA are consistent with a 1-39%  stenosis.   Vertebrals: Bilateral vertebral arteries demonstrate antegrade flow.  2D Echo Ef 60-65%. No shunt  LDL 259 HgbA1c pending  VTE prophylaxis - Lovenox/ SCD's    Diet   Diet Heart Room service appropriate? Yes; Fluid consistency: Thin   No antithrombotic prior to admission, now on aspirin 81 mg daily and clopidogrel 75 mg daily DAPT for 3 weeks and then aspirin alone..  Therapy recommendations:  pending Disposition:  pending  Hypertension Home meds:  none UnStable Gradually normalize BP in 2 to 3 days Long-term BP goal normotensive  Hyperlipidemia Home meds:  none LDL 259, goal < 70 Add atorvastatin   Continue statin at discharge  Other Stroke Risk Factors  Obesity, Body mass index is 42.4 kg/m., BMI >/= 30 associated with increased stroke risk, recommend weight loss, diet and exercise as appropriate  Hx stroke/TIA  Hospital day # 0  Gevena Mart, NP  To contact Stroke Continuity provider, please refer to [email or web address]. After hours, contact General Neurology

## 2021-04-05 NOTE — Progress Notes (Signed)
Inpatient Rehab Admissions Coordinator:   Per PT recommendations,  patient was screened for CIR candidacy by Kori Goins, MS, CCC-SLP. At this time, Pt. Appears to be a a potential candidate for CIR. I will place   order for rehab consult per protocol for full assessment. Please contact me any with questions.  Bianco Cange, MS, CCC-SLP Rehab Admissions Coordinator  [phone] (celll) [phone] (office)  

## 2021-04-05 NOTE — Consult Note (Signed)
NEUROLOGY CONSULTATION NOTE   Date of service: April 05, 2021 Patient Name: Christine Fritz MRN:  [id number] DOB:  1960-09-06 Reason for consult: "L thalamic stroke" Requesting Provider: Rise Patience, MD _ _ _   _ __   _ __ _ _  __ __   _ __   __ _  History of Present Illness  Christine Fritz is a 60 y.o. female with morbid obesity who has not seen a doctor in 20+ years and therefore has no known medical history who presents with right upper extremity and right lower extremity incoordination.  Patient reports that 2 weeks ago she had an episode of right-sided incoordination that was transient and went away.  She reports that since Saturday she has had recurrence of this episode at this time this is persistent would not go away.  She denies any prior history of stroke, she does not follow-up with a doctor and thus does not have any known medical history.  Denies any family history of strokes.   MR S: 0 TNKase/thrombectomy: Outside of window for any intervention. LKW: 04/03/21 NIHSS components Score: Comment  1a Level of Conscious 0[x]  1[]  2[]  3[]      1b LOC Questions 0[x]  1[]  2[]       1c LOC Commands 0[x]  1[]  2[]       2 Best Gaze 0[x]  1[]  2[]       3 Visual 0[x]  1[]  2[]  3[]      4 Facial Palsy 0[x]  1[]  2[]  3[]      5a Motor Arm - left 0[x]  1[]  2[]  3[]  4[]  UN[]    5b Motor Arm - Right 0[x]  1[]  2[]  3[]  4[]  UN[]    6a Motor Leg - Left 0[x]  1[]  2[]  3[]  4[]  UN[]    6b Motor Leg - Right 0[x]  1[]  2[]  3[]  4[]  UN[]    7 Limb Ataxia 0[]  1[]  2[x]  3[]  UN[]     8 Sensory 0[x]  1[]  2[]  UN[]      9 Best Language 0[x]  1[]  2[]  3[]      10 Dysarthria 0[x]  1[]  2[]  UN[]      11 Extinct. and Inattention 0[x]  1[]  2[]       TOTAL: 2       ROS   Constitutional Denies weight loss, fever and chills.   HEENT Denies changes in vision and hearing.   Respiratory Denies SOB and cough.   CV Denies palpitations and CP   GI Denies abdominal pain, nausea, vomiting and diarrhea.   GU Denies dysuria and urinary  frequency.   MSK Denies myalgia and joint pain.   Skin Denies rash and pruritus.   Neurological Denies headache and syncope.   Psychiatric Denies recent changes in mood. Denies anxiety and depression.    Past History  History reviewed. No pertinent past medical history. History reviewed. No pertinent surgical history. No family history on file. Social History   Socioeconomic History  . Marital status: Married    Spouse name: Not on file  . Number of children: Not on file  . Years of education: Not on file  . Highest education level: Not on file  Occupational History  . Not on file  Tobacco Use  . Smoking status: Never  . Smokeless tobacco: Never  Substance and Sexual Activity  . Alcohol use: Not Currently  . Drug use: Not Currently  . Sexual activity: Not on file  Other Topics Concern  . Not on file  Social History Narrative  . Not on file   Social Determinants of Health   Financial  Resource Strain: Not on file  Food Insecurity: Not on file  Transportation Needs: Not on file  Physical Activity: Not on file  Stress: Not on file  Social Connections: Not on file   No Known Allergies  Medications  (Not in a hospital admission)    Vitals   Vitals:   04/04/21 2230 04/05/21 0019 04/05/21 0030 04/05/21 0045  BP: (!) 145/84 (!) 148/101 131/89 (!) 155/109  Pulse: 75 84 81 84  Resp: 13 15 12 19   Temp:      TempSrc:      SpO2: 99% 97% 94% 96%  Weight:      Height:         Body mass index is 42.4 kg/m.  Physical Exam   General: Laying comfortably in bed; in no acute distress.  HENT: Normal oropharynx and mucosa. Normal external appearance of ears and nose.  Neck: Supple, no pain or tenderness  CV: No JVD. No peripheral edema.  Pulmonary: Symmetric Chest rise. Normal respiratory effort.  Abdomen: Soft to touch, non-tender. Ext: No cyanosis, edema, or deformity  Skin: No rash. Normal palpation of skin.   Musculoskeletal: Normal digits and nails by  inspection. No clubbing.  Neurologic Examination  Mental status/Cognition: Alert, oriented to self, place, month and year, good attention.  Speech/language: Fluent, comprehension intact, object naming intact, repetition intact.  Cranial nerves:   CN II Pupils equal and reactive to light, no VF deficits   CN III,IV,VI EOM intact, no gaze preference or deviation, no nystagmus    CN V normal sensation in V1, V2, and V3 segments bilaterally    CN VII no asymmetry, no nasolabial fold flattening    CN VIII normal hearing to speech    CN IX & X normal palatal elevation, no uvular deviation    CN XI 5/5 head turn and 5/5 shoulder shrug bilaterally    CN XII midline tongue protrusion    Motor:  Muscle bulk: Normal, tone normal, pronator drift right upper extremity pronator drift, no tremors noted. Mvmt Root Nerve  Muscle Right Left Comments  SA C5/6 Ax Deltoid 5 5   EF C5/6 Mc Biceps 5 5   EE C6/7/8 Rad Triceps 5 5   WF C6/7 Med FCR     WE C7/8 PIN ECU     F Ab C8/T1 U ADM/FDI 5 5   HF L1/2/3 Fem Illopsoas 4 5   KE L2/3/4 Fem Quad 5 5   DF L4/5 D Peron Tib Ant 5 5   PF S1/2 Tibial Grc/Sol 4+ 5    Reflexes:  Right Left Comments  Pectoralis      Biceps (C5/6) 1 1   Brachioradialis (C5/6) 1 1    Triceps (C6/7) 1 1    Patellar (L3/4) 1 1    Achilles (S1)      Hoffman      Plantar     Jaw jerk    Sensation:  Light touch Intact throughout   Pin prick    Temperature    Vibration   Proprioception    Coordination/Complex Motor:  - Finger to Nose dysmetria and past-pointing in right upper extremity. - Heel to shin unable to do with right lower extremity - Rapid alternating movement slowed in right upper extremity and right lower extremity - Gait: It is unsafe to assess her gait given the extent of her right lower extremity weakness and ataxia. Labs   CBC:  Recent Labs  Lab 04/04/21 1630 04/04/21 1647  WBC 7.6  --   NEUTROABS 5.2  --   HGB 14.0 14.6  HCT 43.4 43.0  MCV  92.1  --   PLT 252  --     Basic Metabolic Panel:  Lab Results  Component Value Date   NA 139 04/04/2021   K 4.0 04/04/2021   CO2 28 04/04/2021   GLUCOSE 93 04/04/2021   BUN 11 04/04/2021   CREATININE 1.60 (H) 04/04/2021   CALCIUM 9.6 04/04/2021   GFRNONAA 37 (L) 04/04/2021   Lipid Panel: No results found for: LDLCALC HgbA1c: No results found for: HGBA1C Urine Drug Screen: No results found for: LABOPIA, COCAINSCRNUR, LABBENZ, AMPHETMU, THCU, LABBARB  Alcohol Level No results found for: [place]  CT Head without contrast(Personally reviewed): CTH was negative for a large hypodensity concerning for a large territory infarct or hyperdensity concerning for an ICH  MR Angio head without contrast and Carotid Duplex BL: pending  MRI Brain(Personally reviewed): Left thalamic/internal capsule stroke Impression   Kentrell L Truss is a 60 y.o. female with morbid obesity who has not seen a doctor in 20+ years and therefore has no known medical history who presents with right upper extremity and right lower extremity incoordination.  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  She is outside window for intervention.  Will need medical admission for stroke work-up.  Primary Diagnosis:  Other cerebral infarction due to occlusion of stenosis of small artery.  Secondary Diagnosis: Morbid Obesity(BMI > 40)  Recommendations  Plan:  - Frequent Neuro checks per stroke unit protocol - Recommend Vascular imaging with MRA Angio Head without contrast and US Carotid doppler - Recommend obtaining TTE - Recommend obtaining Lipid panel with LDL - Please start statin if LDL > 70 - Recommend HbA1c - Antithrombotic - Aspirin 81mg  along with Plavix 75mg  daily x 21 days, followed by aspirin 81mg  daily. - Recommend DVT ppx - SBP goal - permissive hypertension first 24 h < 220/110. Held home meds.  - Recommend Telemetry monitoring for arrythmia - Recommend bedside swallow screen prior to PO intake. -  Stroke education booklet - Recommend PT/OT/SLP consult  Plan discussed with Dr. Hal Hope. ______________________________________________________________________  Thank you for the opportunity to take part in the care of this patient. If you have any further questions, please contact the neurology consultation attending.  Signed,  [place] Pager Number [phone] _ _ _   _ __   _ __ _ _  __ __   _ __   __ _

## 2021-04-05 NOTE — ED Notes (Signed)
This RN was informed by another RN that the pt was in the floor. This RN came into the room where pt was laying on the floor. Pt "stated she wasn't thinking and was trying to get up to the bathroom." This RN re-educated pt that it was not safe for the pt to get up on her own and that she needed to call for help in the future. This RN and tech also applied a purwick to help pt with needing to get up.  This RN also had the secretary page admitting. This RN will continue to monitor.

## 2021-04-05 NOTE — Progress Notes (Signed)
Carotid duplex has been completed.   Preliminary results in CV Proc.   Christine Fritz 04/05/2021 10:12 AM

## 2021-04-05 NOTE — Evaluation (Signed)
Occupational Therapy Evaluation Patient Details Name: Christine Fritz MRN: [id number] DOB: [birth year] Today's Date: 04/05/2021   History of Present Illness Pt is a 60 y/o female admitted secondary to R extremity deficits. 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 No pertinent PMH.   Clinical Impression   PTA pt lives at home independently with her husband, enjoys baking and spending time with her grandchildren. Pt requires mod A +2 for limited mobility (able to stand but unable to side step to affected R side) and  Mod to Max A with LB ADL due to deficits listed below. Pt demonstrates a significant functional decline and would benefit from intensive rehab at CIR to maximize functional level of independence to facilitate safe DC home. Pt tearful at times during assessment due to to being aware of how her deficits are affecting her functionally. Will follow acutely.      Recommendations for follow up therapy are one component of a multi-disciplinary discharge planning process, led by the attending physician.  Recommendations may be updated based on patient status, additional functional criteria and insurance authorization.   Follow Up Recommendations  Acute inpatient rehab (3hours/day)    Assistance Recommended at Discharge Intermittent Supervision/Assistance  Functional Status Assessment  Patient has had a recent decline in their functional status and demonstrates the ability to make significant improvements in function in a reasonable and predictable amount of time.  Equipment Recommendations  BSC/3in1    Recommendations for Other Services Rehab consult     Precautions / Restrictions Precautions Precautions: Fall Precaution Comments: Has had a fall in the ED Restrictions Weight Bearing Restrictions: No      Mobility Bed Mobility Overal bed mobility: Needs Assistance Bed Mobility: Supine to Sit;Sit to Supine     Supine to sit: +2 for safety/equipment;HOB elevated;Mod  assist Sit to supine: Mod assist;+2 for safety/equipment   General bed mobility comments: Required assist for trunk elevation to come to sitting and RLE assist. Mod verbal cues for sequencing to bring RLE back onto bed; unaware of RUE handing off bed    Transfers Overall transfer level: Needs assistance Equipment used: 2 person hand held assist Transfers: Sit to/from Stand Sit to Stand: +2 physical assistance;Mod assist           General transfer comment: Required mod A +2 for lift assist and steadying during standing. Pt with R lateral lean in standing and required assist to maintain upright.      Balance Overall balance assessment: History of Falls;Needs assistance   Sitting balance-Leahy Scale: Fair       Standing balance-Leahy Scale: Poor Standing balance comment: had fall in hospital                           ADL either performed or assessed with clinical judgement   ADL Overall ADL's : Needs assistance/impaired Eating/Feeding: Supervision/ safety;Set up Eating/Feeding Details (indicate cue type and reason): unable to feed self with dominant RUE Grooming: Minimal assistance   Upper Body Bathing: Minimal assistance   Lower Body Bathing: Moderate assistance;Sit to/from stand   Upper Body Dressing : Moderate assistance   Lower Body Dressing: Moderate assistance;Sit to/from stand       Toileting- Architect and Hygiene: Maximal assistance       Functional mobility during ADLs: Moderate assistance;+2 for physical assistance (sit - stnad; unable to take steps)       Vision Baseline Vision/History: 1 Wears glasses Additional Comments: appeasr intact;  will further assess     Perception Perception Comments: appears intact   Praxis      Pertinent Vitals/Pain Pain Assessment: No/denies pain     Hand Dominance Right   Extremity/Trunk Assessment Upper Extremity Assessment Upper Extremity Assessment: RUE deficits/detail RUE  Deficits / Details: AROM overall WFL with intact strength however proprioception/sensation impaired and RUE is very clumsy; apparent sensory motor impairments - unable to hold spoon and successfully complete pattern to mouth; decreased awareness of RUE RUE Sensation: decreased light touch;decreased proprioception RUE Coordination: decreased fine motor;decreased gross motor (significant difficulty with finger to nose)   Lower Extremity Assessment Lower Extremity Assessment: Defer to PT evaluation RLE Deficits / Details: Very uncoordinated   Cervical / Trunk Assessment Cervical / Trunk Assessment: Kyphotic   Communication Communication Communication: Other (comment) (mildly slurred speech); states this is baseline   Cognition Arousal/Alertness: Awake/alert Behavior During Therapy: WFL for tasks assessed/performed (tearful at times) Overall Cognitive Status: Impaired/Different from baseline Area of Impairment: Safety/judgement;Problem solving;Awareness;Attention                   Current Attention Level: Selective     Safety/Judgement: Decreased awareness of deficits;Decreased awareness of safety Awareness: Emergent Problem Solving: Slow processing General Comments: Slow processing noted and required cues for sequencing; will further assess     General Comments  Husband present during eval    Exercises Exercises: Other exercises Other Exercises Other Exercises: encouraged funcitonal use of RUE; encouraged pt to use vision to help compensate for abnormal movement patterns   Shoulder Instructions      Home Living Family/patient expects to be discharged to:: Private residence Living Arrangements: Spouse/significant other;Other relatives Available Help at Discharge: Available 24 hours/day Type of Home: House Home Access: Stairs to enter Entrance Stairs-Number of Steps: 3 Entrance Stairs-Rails: Right;Left Home Layout: Able to live on main level with bedroom/bathroom;Two  level     Bathroom Shower/Tub: Corporate treasurer: Yes   Home Equipment: None          Prior Functioning/Environment Prior Level of Function : Independent/Modified Independent               ADLs Comments: enjoys baking; spending time with grand childresn        OT Problem List: Decreased range of motion;Decreased activity tolerance;Impaired balance (sitting and/or standing);Decreased coordination;Decreased safety awareness;Decreased knowledge of use of DME or AE;Decreased knowledge of precautions;Impaired sensation;Obesity;Impaired UE functional use      OT Treatment/Interventions: Self-care/ADL training;Therapeutic exercise;Neuromuscular education;Energy conservation;DME and/or AE instruction;Therapeutic activities;Cognitive remediation/compensation;Visual/perceptual remediation/compensation;Patient/family education;Balance training    OT Goals(Current goals can be found in the care plan section) Acute Rehab OT Goals Patient Stated Goal: to get back to normal OT Goal Formulation: With patient Time For Goal Achievement: 04/19/21 Potential to Achieve Goals: Good  OT Frequency: Min 2X/week   Barriers to D/C:            Co-evaluation PT/OT/SLP Co-Evaluation/Treatment: Yes Reason for Co-Treatment: Necessary to address cognition/behavior during functional activity;For patient/therapist safety PT goals addressed during session: Mobility/safety with mobility;Balance OT goals addressed during session: ADL's and self-care      AM-PAC OT "6 Clicks" Daily Activity     Outcome Measure Help from another person eating meals?: A Little Help from another person taking care of personal grooming?: A Little Help from another person toileting, which includes using toliet, bedpan, or urinal?: A Lot Help from another person bathing (including washing, rinsing, drying)?: A Lot Help from another  person to put on and taking off  regular upper body clothing?: A Lot Help from another person to put on and taking off regular lower body clothing?: A Lot 6 Click Score: 14   End of Session Equipment Utilized During Treatment: Gait belt Nurse Communication: Mobility status  Activity Tolerance: Patient tolerated treatment well Patient left: in bed;with call bell/phone within reach;with family/visitor present  OT Visit Diagnosis: Unsteadiness on feet (R26.81);Other abnormalities of gait and mobility (R26.89);Muscle weakness (generalized) (M62.81);History of falling (Z91.81);Other symptoms and signs involving the nervous system (R29.898);Hemiplegia and hemiparesis;Dizziness and giddiness (R42) Hemiplegia - Right/Left: Right Hemiplegia - dominant/non-dominant: Dominant Hemiplegia - caused by: Cerebral infarction                Time: 7078-6754 OT Time Calculation (min): 23 min Charges:  OT General Charges $OT Visit: 1 Visit OT Evaluation $OT Eval Moderate Complexity: 1 Mod  Loralee Weitzman, OT/L   Acute OT Clinical Specialist Acute Rehabilitation Services Pager [phone] Office [phone]   [facility] 04/05/2021, 3:54 PM

## 2021-04-05 NOTE — Progress Notes (Addendum)
[facility] PROGRESS NOTE   Christine Fritz NWG:[id number] DOB: 1961-05-08 DOA: 04/04/2021  PCP: Merri Brunette, MD  Brief History/Interval Summary: 60 y.o. female with no significant past medical history who has not been to a doctor for more than 20 years presented to the ER with complaints of right upper and lower extremity weakness and numbness which has been ongoing since Saturday, April 03, 2021.  Evaluation raise concern for acute stroke.  Patient was hospitalized for further management.    Reason for Visit: Acute stroke  Consultants: Neurology  Procedures: Transthoracic echocardiogram is pending    Subjective/Interval History: Patient had of fall out of her bed this morning when she was trying to get up on her own.  No injuries were noted.  Patient denies any pain at this time.  No chest pain shortness of breath.  No nausea vomiting.  Continues to have weakness on the right side of her body.     Assessment/Plan:  Acute stroke Patient presented with right-sided weakness.  MRI shows acute stroke.  Carotid Dopplers pending.   Neurology has been consulted. Patient currently on aspirin Plavix statin. LDL 259.  HbA1c is pending. PT OT SLP evaluation.  Echocardiogram is pending.  Accelerated hypertension No previous history of hypertension although she does not follow-up with outpatient providers on a regular basis. Currently permissive hypertension is being allowed.  Will need definitive management for her hypertension in the near future.  Moderate Pericardial Effusion Noted on echocardiogram No tamponade features notes. Repeat ECHO recommended prior to discharge. Will check TSH, inflammatory markers, CXR. Patient denied chest pain. EKG without findings of pericarditis.   Elevated creatinine Presented with creatinine of 1.59.  No old labs noted in our system.  Nothing in Care Everywhere either.  Likely has some degree of chronic kidney disease, stage unspecified.   Continue to monitor urine output.  Avoid nephrotoxic agent.  Check UA.  Class III obesity Estimated body mass index is 42.4 kg/m as calculated from the following:   Height as of this encounter: 5\' 4"  (1.626 m).   Weight as of this encounter: 112 kg.  DVT Prophylaxis: Lovenox Code Status: Full code Family Communication: No family at bedside Disposition Plan: To be determined  Status is: Observation  The patient will require care spanning > 2 midnights and should be moved to inpatient because: Acute stroke with right-sided deficits.    Medications: Scheduled:   stroke: mapping our early stages of recovery book   Does not apply Once   aspirin  300 mg Rectal Daily   Or   aspirin  325 mg Oral Daily   atorvastatin  80 mg Oral Daily   clopidogrel  75 mg Oral Daily   enoxaparin (LOVENOX) injection  50 mg Subcutaneous Q24H   sodium chloride flush  3 mL Intravenous Once   Continuous:  sodium chloride 75 mL/hr at 04/05/21 0655   04/07/21 **OR** acetaminophen (TYLENOL) oral liquid 160 mg/5 mL **OR** acetaminophen  Antibiotics: Anti-infectives (From admission, onward)    None       Objective:  Vital Signs  Vitals:   04/05/21 0600 04/05/21 0615 04/05/21 0630 04/05/21 0845  BP: (!) 166/115 (!) 162/113 (!) 175/129 (!) 173/117  Pulse: 83 79 78 80  Resp: 11 11 11 13   Temp:      TempSrc:      SpO2: 97% 96% 95% 96%  Weight:      Height:       No intake or output data in  the 24 hours ending 04/05/21 0905 Filed Weights   04/04/21 1937  Weight: 112 kg    General appearance: Awake alert.  In no distress Resp: Clear to auscultation bilaterally.  Normal effort Cardio: S1-S2 is normal regular.  No S3-S4.  No rubs murmurs or bruit GI: Abdomen is soft.  Nontender nondistended.  Bowel sounds are present normal.  No masses organomegaly Extremities: No edema.  Full range of motion of lower extremities. Neurologic: Alert and oriented x3.  Right-sided weakness noted.  No  facial asymmetry.   Lab Results:  Data Reviewed: I have personally reviewed following labs and imaging studies  CBC: Recent Labs  Lab 04/04/21 1630 04/04/21 1647 04/05/21 0457  WBC 7.6  --  9.0  NEUTROABS 5.2  --   --   HGB 14.0 14.6 14.1  HCT 43.4 43.0 42.7  MCV 92.1  --  91.4  PLT 252  --  [id number]    Basic Metabolic Panel: Recent Labs  Lab 04/04/21 1630 04/04/21 1647 04/05/21 0457  NA 138 139  --   K 4.0 4.0  --   CL 99 100  --   CO2 28  --   --   GLUCOSE 97 93  --   BUN 10 11  --   CREATININE 1.59* 1.60* 1.56*  CALCIUM 9.6  --   --     GFR: Estimated Creatinine Clearance: 47.6 mL/min (A) (by C-G formula based on SCr of 1.56 mg/dL (H)).  Liver Function Tests: Recent Labs  Lab 04/04/21 1630  AST 27  ALT 14  ALKPHOS 57  BILITOT 0.8  PROT 7.7  ALBUMIN 4.4     Coagulation Profile: Recent Labs  Lab 04/04/21 1630  INR 1.0     CBG: Recent Labs  Lab 04/04/21 1633  GLUCAP 101*    Lipid Profile: Recent Labs    04/05/21 0457  CHOL 353*  HDL 49  LDLCALC 259*  TRIG 227*  CHOLHDL 7.2      Recent Results (from the past 240 hour(s))  Resp Panel by RT-PCR (Flu A&B, Covid) Nasopharyngeal Swab     Status: None   Collection Time: 04/05/21  4:57 AM   Specimen: Nasopharyngeal Swab; Nasopharyngeal(NP) swabs in vial transport medium  Result Value Ref Range Status   SARS Coronavirus 2 by RT PCR NEGATIVE NEGATIVE Final    Comment: (NOTE) SARS-CoV-2 target nucleic acids are NOT DETECTED.  The SARS-CoV-2 RNA is generally detectable in upper respiratory specimens during the acute phase of infection. The lowest concentration of SARS-CoV-2 viral copies this assay can detect is 138 copies/mL. A negative result does not preclude SARS-Cov-2 infection and should not be used as the sole basis for treatment or other patient management decisions. A negative result may occur with  improper specimen collection/handling, submission of specimen other than  nasopharyngeal swab, presence of viral mutation(s) within the areas targeted by this assay, and inadequate number of viral copies(<138 copies/mL). A negative result must be combined with clinical observations, patient history, and epidemiological information. The expected result is Negative.  Fact Sheet for Patients:  EntrepreneurPulse.com.au  Fact Sheet for Healthcare Providers:  IncredibleEmployment.be  This test is no t yet approved or cleared by the [place] FDA and  has been authorized for detection and/or diagnosis of SARS-CoV-2 by FDA under an Emergency Use Authorization (EUA). This EUA will remain  in effect (meaning this test can be used) for the duration of the COVID-19 declaration under Section 564(b)(1) of the Act, 21 U.S.C.section  360bbb-3(b)(1), unless the authorization is terminated  or revoked sooner.       Influenza A by PCR NEGATIVE NEGATIVE Final   Influenza B by PCR NEGATIVE NEGATIVE Final    Comment: (NOTE) The Xpert Xpress SARS-CoV-2/FLU/RSV plus assay is intended as an aid in the diagnosis of influenza from Nasopharyngeal swab specimens and should not be used as a sole basis for treatment. Nasal washings and aspirates are unacceptable for Xpert Xpress SARS-CoV-2/FLU/RSV testing.  Fact Sheet for Patients: EntrepreneurPulse.com.au  Fact Sheet for Healthcare Providers: IncredibleEmployment.be  This test is not yet approved or cleared by the [place] FDA and has been authorized for detection and/or diagnosis of SARS-CoV-2 by FDA under an Emergency Use Authorization (EUA). This EUA will remain in effect (meaning this test can be used) for the duration of the COVID-19 declaration under Section 564(b)(1) of the Act, 21 U.S.C. section 360bbb-3(b)(1), unless the authorization is terminated or revoked.  Performed at [facility], [place] [address] [place], [place] [address]        Radiology Studies: DG Pelvis 1-2 Views  Result Date: 04/05/2021 CLINICAL DATA:  Fall EXAM: PELVIS - 1-2 VIEW COMPARISON:  None. FINDINGS: There is no evidence of pelvic fracture or diastasis. No pelvic bone lesions are seen. IMPRESSION: Negative Electronically Signed   By: Jorje Guild M.D.   On: 04/05/2021 06:01   CT HEAD WO CONTRAST  Result Date: 04/04/2021 CLINICAL DATA:  Neuro deficit, acute, stroke suspected. Right-sided weakness. EXAM: CT HEAD WITHOUT CONTRAST TECHNIQUE: Contiguous axial images were obtained from the base of the skull through the vertex without intravenous contrast. COMPARISON:  None. 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 Mild chronic small vessel disease throughout the deep white matter. No hemorrhage or hydrocephalus. Vascular: No hyperdense vessel or unexpected calcification. Skull: No acute calvarial abnormality. 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 Chronic small vessel disease. Electronically Signed   By: Rolm Baptise M.D.   On: 04/04/2021 18:36   MR ANGIO HEAD WO CONTRAST  Result Date: 04/05/2021 CLINICAL DATA:  Follow-up examination for acute stroke. EXAM: MRA HEAD WITHOUT CONTRAST TECHNIQUE: Angiographic images of the Circle of Willis were acquired using MRA technique without intravenous contrast. COMPARISON:  Prior brain MRI from 04/04/2021 FINDINGS: Anterior circulation: Examination moderately degraded by motion artifact. Petrous, cavernous, and supraclinoid segments of both internal carotid arteries patent without flow-limiting stenosis. A1 segments patent bilaterally. Normal anterior communicating artery complex. Left ACA patent to its distal aspect without stenosis. There is an apparent focal severe distal right A2 stenosis (series 1063, image 4). This is also seen on source time-of-flight sequence (series 5, image 110). Right ACA otherwise  patent. M1 segments patent without significant stenosis. Negative MCA bifurcations. Distal MCA branches perfused and symmetric. Suspected distal small vessel atheromatous irregularity. Posterior circulation: Visualized distal V4 segments patent without stenosis. Right vertebral artery slightly dominant. Right PICA origin patent. Left PICA origin not seen on this exam. Basilar grossly patent to its distal aspect without significant stenosis. Right SCA patent. There is question of a focal severe stenosis at the origin of the left SCA (series 1051, image 4). Both PCAs primarily supplied via the basilar. PCAs patent to their distal aspects without significant stenosis. Anatomic variants: None significant. Other: No intracranial aneurysm or other vascular abnormality. IMPRESSION: 1. Motion degraded exam. 2. Negative intracranial MRA for large vessel occlusion. 3. Severe right A2 stenosis. 4. Question additional severe stenosis at the origin of the  left SCA. 5. No other hemodynamically significant or correctable stenosis. Electronically Signed   By: Jeannine Boga M.D.   On: 04/05/2021 04:25   MR BRAIN WO CONTRAST  Result Date: 04/05/2021 CLINICAL DATA:  Initial evaluation for acute stroke, right-sided paresthesias for 24 hours. EXAM: MRI HEAD WITHOUT CONTRAST TECHNIQUE: Multiplanar, multiecho pulse sequences of the brain and surrounding structures were obtained without intravenous contrast. COMPARISON:  Head CT from earlier the same day. FINDINGS: Brain: Cerebral volume within normal limits. Mild chronic microvascular ischemic disease noted involving the supratentorial cerebral white matter. Remote lacunar infarct at the right basal ganglia/corona radiata. 1 cm focus of restricted diffusion seen involving the posterior limb of the left internal capsule, consistent with a acute ischemic infarct (series 5, image 72). No associated hemorrhage or mass effect. No other evidence for acute or subacute ischemia.  Gray-white matter differentiation otherwise maintained. No acute intracranial hemorrhage. Few small chronic micro hemorrhages noted involving the left thalamus, likely hypertensive in nature. No mass lesion or mass effect. No midline shift or hydrocephalus. No extra-axial fluid collection. Pituitary gland suprasellar region within normal limits. Midline structures intact. Vascular: Abnormal flow void within the distal right vertebral artery within the neck, likely occluded (series 10, image 1). Preserved but mildly heterogeneous flow seen within the right V4 segment distally, which could be retrograde in nature. Major intracranial vascular flow voids otherwise maintained. Skull and upper cervical spine: Craniocervical junction within normal limits. Bone marrow signal intensity normal. No scalp soft tissue abnormality. Sinuses/Orbits: Globes normal soft tissues demonstrate no acute finding. Paranasal sinuses are largely clear. No significant mastoid effusion. Other: None. IMPRESSION: 1. 1 cm acute ischemic nonhemorrhagic infarct involving the posterior limb of the left internal capsule. 2. Remote lacunar infarct at the right basal ganglia/corona radiata. 3. Underlying mild chronic microvascular ischemic disease. 4. Absent flow void within the right vertebral artery within the neck, likely occluded. Electronically Signed   By: Jeannine Boga M.D.   On: 04/05/2021 01:06   MR Cervical Spine Wo Contrast  Result Date: 04/05/2021 CLINICAL DATA:  Initial evaluation for cervical radiculopathy, right arm and leg paresthesias. EXAM: MRI CERVICAL SPINE WITHOUT CONTRAST TECHNIQUE: Multiplanar, multisequence MR imaging of the cervical spine was performed. No intravenous contrast was administered. COMPARISON:  None available. FINDINGS: Alignment: Straightening with slight reversal of the normal cervical lordosis. No listhesis. Vertebrae: Vertebral body height maintained without acute or chronic fracture. Bone marrow  signal intensity within normal limits. No discrete or worrisome osseous lesions. No abnormal marrow edema. Cord: Normal signal and morphology. Posterior Fossa, vertebral arteries, paraspinal tissues: Craniocervical junction within normal limits. Paraspinous soft tissues demonstrate no acute finding. 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 Disc levels: C2-C3: Negative interspace. Mild left-sided facet hypertrophy. No stenosis. C3-C4: Mild endplate spurring without significant disc bulge. Mild facet hypertrophy. No spinal stenosis. Mild bilateral C4 foraminal narrowing. C4-C5: Mild disc bulge with uncovertebral hypertrophy. Right-sided facet degeneration. No spinal stenosis. Mild left C5 foraminal narrowing. Right neural foramina remains patent. C5-C6: Degenerative intervertebral disc space narrowing. Left paracentral disc protrusion indents the left ventral thecal sac, contacting and mildly flattening the ventral cord (series 8, image 29). Mild spinal stenosis. Superimposed uncovertebral spurring with resultant mild right C6 foraminal narrowing. Left neural foramina remains patent. C6-C7: Degenerative intervertebral disc space narrowing with diffuse disc bulge, endplate spurring, and uncovertebral hypertrophy. Broad posterior disc osteophyte flattens and partially faces the ventral thecal sac with resultant mild spinal stenosis. Foramina remain patent. C7-T1: Negative interspace.  Left greater than right facet hypertrophy. No spinal stenosis. Mild left C8 foraminal narrowing. Right neural foramina remains patent. Visualized upper thoracic spine demonstrates no significant finding. IMPRESSION: 1. Left paracentral disc protrusion at C5-6 contacting and mildly flattening the left hemi cord and resulting in mild spinal stenosis. 2. Degenerative disc osteophyte at C6-7 with resultant mild spinal stenosis. 3. Mild bilateral C4, left C5, right C6, and left C8 foraminal stenosis related to disc  bulging and uncovertebral disease. 4. 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 Electronically Signed   By: Jeannine Boga M.D.   On: 04/05/2021 01:24       LOS: 0 days   [facility] Pager on [email or web address]  04/05/2021, 9:05 AM

## 2021-04-05 NOTE — Evaluation (Signed)
Physical Therapy Evaluation Patient Details Name: Christine Fritz MRN: [id number] DOB: May 07, 1961 Today's Date: 04/05/2021  History of Present Illness  Pt is a 60 y/o female admitted secondary to R extremity deficits. 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 No pertinent PMH.  Clinical Impression  Pt admitted secondary to problem above with deficits below. Poor coordination noted in R extremities throughout and pt requiring multimodal cues for sequencing. Pt requiring mod A +2 for transfers this session and demonstrated heavy R lateral lean. Pt was previously independent with mobility tasks. Recommending CIR level therapies to increase independence and safety. Will continue to follow acutely.      Recommendations for follow up therapy are one component of a multi-disciplinary discharge planning process, led by the attending physician.  Recommendations may be updated based on patient status, additional functional criteria and insurance authorization.  Follow Up Recommendations Acute inpatient rehab (3hours/day)    Assistance Recommended at Discharge Frequent or constant Supervision/Assistance  Functional Status Assessment Patient has had a recent decline in their functional status and demonstrates the ability to make significant improvements in function in a reasonable and predictable amount of time.  Equipment Recommendations  Wheelchair cushion (measurements PT);Wheelchair (measurements PT)    Recommendations for Other Services Rehab consult     Precautions / Restrictions Precautions Precautions: Fall Precaution Comments: Has had a fall in the ED Restrictions Weight Bearing Restrictions: No      Mobility  Bed Mobility Overal bed mobility: Needs Assistance Bed Mobility: Supine to Sit;Sit to Supine     Supine to sit: +2 for safety/equipment;HOB elevated;Mod assist Sit to supine: Mod assist;+2 for safety/equipment   General bed mobility comments: Required assist for trunk  elevation to come to sitting and RLE assist. Mod verbal cues for sequencing.    Transfers Overall transfer level: Needs assistance Equipment used: 2 person hand held assist Transfers: Sit to/from Stand Sit to Stand: +2 physical assistance;Mod assist           General transfer comment: Required mod A +2 for lift assist and steadying during standing. Pt with R lateral lean in standing and required assist to maintain upright.    Ambulation/Gait                  Stairs            Wheelchair Mobility    Modified Rankin (Stroke Patients Only) Modified Rankin (Stroke Patients Only) Pre-Morbid Rankin Score: No symptoms Modified Rankin: Moderately severe disability     Balance                                             Pertinent Vitals/Pain Pain Assessment: No/denies pain    Home Living Family/patient expects to be discharged to:: Private residence Living Arrangements: Spouse/significant other;Other relatives Available Help at Discharge: Available 24 hours/day Type of Home: House Home Access: Stairs to enter Entrance Stairs-Rails: Right;Left Entrance Stairs-Number of Steps: 3   Home Layout: Able to live on main level with bedroom/bathroom;Two level Home Equipment: None      Prior Function Prior Level of Function : Independent/Modified Independent                     Hand Dominance   Dominant Hand: Right    Extremity/Trunk Assessment   Upper Extremity Assessment Upper Extremity Assessment: Defer to OT evaluation  Lower Extremity Assessment Lower Extremity Assessment: RLE deficits/detail RLE Deficits / Details: Very uncoordinated during heel to shin test. Able to perform SLR with increased time. Strength grossly 4/5    Cervical / Trunk Assessment Cervical / Trunk Assessment: Kyphotic  Communication   Communication: Other (comment) (mildly slurred speech)  Cognition Arousal/Alertness: Awake/alert Behavior During  Therapy: WFL for tasks assessed/performed Overall Cognitive Status: Impaired/Different from baseline Area of Impairment: Safety/judgement;Problem solving                         Safety/Judgement: Decreased awareness of deficits;Decreased awareness of safety   Problem Solving: Slow processing General Comments: Slow processing noted and required cues for sequencing.        General Comments General comments (skin integrity, edema, etc.): Pt's husband present    Exercises     Assessment/Plan    PT Assessment Patient needs continued PT services  PT Problem List Decreased strength;Decreased mobility;Decreased balance;Decreased coordination;Decreased cognition;Decreased safety awareness;Decreased knowledge of precautions;Impaired sensation       PT Treatment Interventions DME instruction;Gait training;Functional mobility training;Therapeutic activities;Balance training;Therapeutic exercise;Stair training;Patient/family education    PT Goals (Current goals can be found in the Care Plan section)  Acute Rehab PT Goals Patient Stated Goal: to be independent PT Goal Formulation: With patient Time For Goal Achievement: 04/19/21 Potential to Achieve Goals: Good    Frequency Min 4X/week   Barriers to discharge        Co-evaluation PT/OT/SLP Co-Evaluation/Treatment: Yes Reason for Co-Treatment: To address functional/ADL transfers;For patient/therapist safety;Necessary to address cognition/behavior during functional activity PT goals addressed during session: Mobility/safety with mobility;Balance         AM-PAC PT "6 Clicks" Mobility  Outcome Measure Help needed turning from your back to your side while in a flat bed without using bedrails?: A Little Help needed moving from lying on your back to sitting on the side of a flat bed without using bedrails?: A Lot Help needed moving to and from a bed to a chair (including a wheelchair)?: Total Help needed standing up from a  chair using your arms (e.g., wheelchair or bedside chair)?: Total Help needed to walk in hospital room?: Total Help needed climbing 3-5 steps with a railing? : Total 6 Click Score: 9    End of Session Equipment Utilized During Treatment: Gait belt Activity Tolerance: Patient tolerated treatment well Patient left: in bed;with call bell/phone within reach;with family/visitor present (on bed in ED) Nurse Communication: Mobility status PT Visit Diagnosis: Unsteadiness on feet (R26.81);Muscle weakness (generalized) (M62.81);Difficulty in walking, not elsewhere classified (R26.2)    Time: 3474-2595 PT Time Calculation (min) (ACUTE ONLY): 25 min   Charges:   PT Evaluation $PT Eval Moderate Complexity: 1 Mod          Farley Ly, PT, DPT  Acute Rehabilitation Services  Pager: [phone] Office: [phone]   [facility] Prom 04/05/2021, 1:59 PM

## 2021-04-05 NOTE — ED Notes (Signed)
NP into room, at BS 

## 2021-04-06 DIAGNOSIS — E039 Hypothyroidism, unspecified: Secondary | ICD-10-CM

## 2021-04-06 DIAGNOSIS — I3139 Other pericardial effusion (noninflammatory): Secondary | ICD-10-CM

## 2021-04-06 LAB — CBC
HCT: 40.7 % (ref 36.0–46.0)
Hemoglobin: 13.1 g/dL (ref 12.0–15.0)
MCH: 29.5 pg (ref 26.0–34.0)
MCHC: 32.2 g/dL (ref 30.0–36.0)
MCV: 91.7 fL (ref 80.0–100.0)
Platelets: 228 10*3/uL (ref 150–400)
RBC: 4.44 MIL/uL (ref 3.87–5.11)
RDW: 14.6 % (ref 11.5–15.5)
WBC: 8 10*3/uL (ref 4.0–10.5)
nRBC: 0 % (ref 0.0–0.2)

## 2021-04-06 LAB — BASIC METABOLIC PANEL
Anion gap: 8 (ref 5–15)
BUN: 13 mg/dL (ref 6–20)
CO2: 28 mmol/L (ref 22–32)
Calcium: 9 mg/dL (ref 8.9–10.3)
Chloride: 102 mmol/L (ref 98–111)
Creatinine, Ser: 1.72 mg/dL — ABNORMAL HIGH (ref 0.44–1.00)
GFR, Estimated: 34 mL/min — ABNORMAL LOW (ref >60)
Glucose, Bld: 104 mg/dL — ABNORMAL HIGH (ref 70–99)
Potassium: 4 mmol/L (ref 3.5–5.1)
Sodium: 138 mmol/L (ref 135–145)

## 2021-04-06 LAB — C-REACTIVE PROTEIN: CRP: 0.6 mg/dL (ref <1.0)

## 2021-04-06 LAB — T4, FREE: Free T4: 0.32 ng/dL — ABNORMAL LOW (ref 0.61–1.12)

## 2021-04-06 LAB — TSH: TSH: 219.795 u[IU]/mL — ABNORMAL HIGH (ref 0.350–4.500)

## 2021-04-06 LAB — SEDIMENTATION RATE: Sed Rate: 24 mm/hr — ABNORMAL HIGH (ref 0–22)

## 2021-04-06 MED ORDER — LEVOTHYROXINE SODIUM 100 MCG PO TABS: 100.0000 ug | ORAL_TABLET | Freq: Every day | ORAL | Status: DC

## 2021-04-06 MED ORDER — AMLODIPINE BESYLATE 5 MG PO TABS
5.0000 mg | ORAL_TABLET | Freq: Every day | ORAL | Status: DC
  Administered 2021-04-06 – 2021-04-08 (×3): 5 mg via ORAL
  Filled 2021-04-06 (×3): qty 1

## 2021-04-06 MED ORDER — LEVOTHYROXINE SODIUM 100 MCG PO TABS
100.0000 ug | ORAL_TABLET | Freq: Every day | ORAL | Status: DC
Start: 2021-04-06 — End: 2021-04-08
  Administered 2021-04-07 – 2021-04-08 (×2): 100 ug via ORAL
  Filled 2021-04-06 (×2): qty 1

## 2021-04-06 NOTE — Progress Notes (Signed)
Inpatient Rehabilitation Admissions Coordinator   I met with patient at bedside with her brother. We discussed goals and expectations of a possible CIR admit. She prefers CIR before return home . I will begin insurance Auth with [employer].  Danne Baxter, RN, MSN Rehab Admissions Coordinator [phone] 04/06/2021 12:20 PM

## 2021-04-06 NOTE — Progress Notes (Signed)
Physical Therapy Treatment Patient Details Name: Christine Fritz MRN: [id number] DOB: 11/30/1960 Today's Date: 04/06/2021   History of Present Illness Pt is a 60 y/o female admitted secondary to R extremity deficits. 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 No pertinent PMH.    PT Comments    Patient is doing well, up in recliner with mother present in room. She is agreeable to PT session. Patient making very good progress with mobility. Able to stand with min A and ambulated with min A and RW 25 feet. Performed seated LE exercises. Patient is a good candidate for inpatient rehab at this time, but may progress to the point where she is too high level and may need Out patient PT at discharge. Will continue to assess. Patient will continue to benefit from skilled PT while here to improve safety and functional independence.        Recommendations for follow up therapy are one component of a multi-disciplinary discharge planning process, led by the attending physician.  Recommendations may be updated based on patient status, additional functional criteria and insurance authorization.  Follow Up Recommendations  Acute inpatient rehab (3hours/day)     Assistance Recommended at Discharge Frequent or constant Supervision/Assistance  Equipment Recommendations  Rolling walker (2 wheels)    Recommendations for Other Services Rehab consult     Precautions / Restrictions Precautions Precautions: Fall Precaution Comments: Has had a fall in the ED Restrictions Weight Bearing Restrictions: No     Mobility  Bed Mobility               General bed mobility comments: Patient received in recliner and remained up in recliner at end of session    Transfers Overall transfer level: Needs assistance Equipment used: Rolling walker (2 wheels) Transfers: Sit to/from Stand Sit to Stand: Min assist;+2 safety/equipment           General transfer comment: cues to push up from recliner. Min  assist to stand    Ambulation/Gait Ambulation/Gait assistance: Min assist Gait Distance (Feet): 25 Feet Assistive device: Rolling walker (2 wheels) Gait Pattern/deviations: Step-through pattern;Decreased step length - right;Narrow base of support;Shuffle;Trunk flexed Gait velocity: decr     General Gait Details: Cues needed for sequencing and min assist for safety. Cues for posture and step length on right   Stairs             Wheelchair Mobility    Modified Rankin (Stroke Patients Only) Modified Rankin (Stroke Patients Only) Pre-Morbid Rankin Score: No symptoms Modified Rankin: Moderately severe disability     Balance Overall balance assessment: History of Falls;Needs assistance Sitting-balance support: Feet supported Sitting balance-Leahy Scale: Fair     Standing balance support: Bilateral upper extremity supported;During functional activity Standing balance-Leahy Scale: Fair Standing balance comment: had fall in hospital                            Cognition Arousal/Alertness: Awake/alert Behavior During Therapy: WFL for tasks assessed/performed Overall Cognitive Status: Within Functional Limits for tasks assessed Area of Impairment: Safety/judgement                   Current Attention Level: Sustained     Safety/Judgement: Decreased awareness of safety Awareness: Emergent Problem Solving: Requires verbal cues;Requires tactile cues   Functional Status Assessment: Patient has had a recent decline in their functional status and demonstrates the ability to make significant improvements in function in a reasonable and  predictable amount of time.      Exercises Other Exercises Other Exercises: R LE exercises: Seated AP, LAQ, Marching. Cues for quality of movement    General Comments        Pertinent Vitals/Pain Pain Assessment: No/denies pain    Home Living     Available Help at Discharge: Available 24 hours/day Type of Home:  House                  Prior Function            PT Goals (current goals can now be found in the care plan section) Acute Rehab PT Goals Patient Stated Goal: to be independent PT Goal Formulation: With patient Time For Goal Achievement: 04/19/21 Potential to Achieve Goals: Good Progress towards PT goals: Progressing toward goals    Frequency    Min 4X/week      PT Plan Equipment recommendations need to be updated    Co-evaluation              AM-PAC PT "6 Clicks" Mobility   Outcome Measure  Help needed turning from your back to your side while in a flat bed without using bedrails?: A Little Help needed moving from lying on your back to sitting on the side of a flat bed without using bedrails?: A Little Help needed moving to and from a bed to a chair (including a wheelchair)?: A Little Help needed standing up from a chair using your arms (e.g., wheelchair or bedside chair)?: A Little Help needed to walk in hospital room?: A Little Help needed climbing 3-5 steps with a railing? : A Lot 6 Click Score: 17    End of Session Equipment Utilized During Treatment: Gait belt Activity Tolerance: Patient tolerated treatment well Patient left: in chair;with call bell/phone within reach;with chair alarm set;with family/visitor present Nurse Communication: Mobility status PT Visit Diagnosis: Unsteadiness on feet (R26.81);Muscle weakness (generalized) (M62.81);Difficulty in walking, not elsewhere classified (R26.2);History of falling (Z91.81);Hemiplegia and hemiparesis Hemiplegia - Right/Left: Right Hemiplegia - dominant/non-dominant: Dominant Hemiplegia - caused by: Cerebral infarction     Time: 6629-4765 PT Time Calculation (min) (ACUTE ONLY): 23 min  Charges:  $Gait Training: 8-22 mins $Therapeutic Exercise: 8-22 mins                     Davier Tramell, PT, GCS 04/06/21,10:57 AM

## 2021-04-06 NOTE — Evaluation (Addendum)
Speech Language Pathology Evaluation Patient Details Name: Christine Fritz MRN: [id number] DOB: 07/20/1960 Today's Date: 04/06/2021 Time: 4098-1191 SLP Time Calculation (min) (ACUTE ONLY): 30 min  Problem List:  Patient Active Problem List   Diagnosis Date Noted   Acute CVA (cerebrovascular accident) (HCC) 04/05/2021   Past Medical History: History reviewed. No pertinent past medical history. Past Surgical History: History reviewed. No pertinent surgical history. HPI:  60yo female admitted 04/04/21 with right side weakness/numbness. No facial droop or speech changes. PMH: negative. MRI = 1cm acute ischemic nonhemorrhagic infarct in the posterior limb of the left internal capsule. Remote lacunar infarct at the right basal ganglia/corona radiata, underlying mild chronic microvascular ischemic disease.   Assessment / Plan / Recommendation Clinical Impression  The [employer] Mental Status (SLUMS) Examination was administered to assess cognitive linguistic function. Pt was awake and alert. No family present. She reports being fully independent with finances, household responsibilities, and driving prior to admit. She managed her husbands medications. Pt speech is fully intelligible. Receptive and Expressive Language are intact. Pt scored 28/30 on the SLUMS, indicating performance within functional limits for this patient with an associates degree. 2 points were lost on mental math. Recommend more formal evaluation upon admit to CIR given level of independence and function prior to admit. If pt does not transfer to CIR, pt was encouraged to notify PCP if difficulties arise upon return to her normal routines. No further acute ST intervention recommended at this time.   BSE deferred at this time, as pt passed the [employer] Screen. She was finishing breakfast upon arrival of SLP, and no difficulties were observed or reported. Please reconsult if needs arise.   SLP Assessment  SLP  Recommendation/Assessment: All further Speech Language Pathology needs can be addressed in the next venue of care  SLP Visit Diagnosis: Cognitive communication deficit (R41.841)    Recommendations for follow up therapy are one component of a multi-disciplinary discharge planning process, led by the attending physician.  Recommendations may be updated based on patient status, additional functional criteria and insurance authorization.    Follow Up Recommendations  Other (comment) (SLP evaluation on CIR for high level cognition)    Assistance Recommended at Discharge  Other (comment) (TBD)  Functional Status Assessment Patient has had a recent decline in their functional status and demonstrates the ability to make significant improvements in function in a reasonable and predictable amount of time.     SLP Evaluation Cognition  Overall Cognitive Status: Within Functional Limits for tasks assessed Arousal/Alertness: Awake/alert Orientation Level: Oriented X4       Comprehension  Auditory Comprehension Overall Auditory Comprehension: Appears within functional limits for tasks assessed    Expression Expression Primary Mode of Expression: Verbal Verbal Expression Overall Verbal Expression: Appears within functional limits for tasks assessed Written Expression Dominant Hand: Right   Oral / Motor  Motor Speech Overall Motor Speech: Appears within functional limits for tasks assessed Intelligibility: Intelligible   GO                   Stevie Charter B. Murvin Natal, [facility], CCC-SLP Speech Language Pathologist Office: [phone]  Leigh Aurora 04/06/2021, 9:28 AM

## 2021-04-06 NOTE — Progress Notes (Addendum)
[facility] PROGRESS NOTE   Christine GarfinkelSheri L Fritz WUJ:[id number] DOB: 12/17/1960 DOA: 04/04/2021  PCP: Merri BrunetteSmith, Candace, MD  Brief History/Interval Summary: 60 y.o. female with no significant past medical history who has not been to a doctor for more than 20 years presented to the ER with complaints of right upper and lower extremity weakness and numbness which has been ongoing since Saturday, April 03, 2021.  Evaluation raise concern for acute stroke.  Patient was hospitalized for further management.    Reason for Visit: Acute stroke  Consultants: Neurology  Procedures: Transthoracic echocardiogram (see report below)    Subjective/Interval History: Patient continues to have weakness in the right upper and lower extremity though no has noted improved strength in the right upper extremity.  Denies any new complaints.  Denies any history of thyroid disease previously.  Cannot remember if her thyroid hormone levels were ever checked previously.      Assessment/Plan:  Acute stroke Patient presented with right-sided weakness.  MRI shows acute stroke.  Carotid Dopplers without any significant stenosis.   Patient was seen by neurology.  Plan is for aspirin and Plavix for 3 weeks followed by aspirin alone.  Statin is also on board.  LDL 259.  HbA1c 5.5. Echocardiogram shows normal systolic function with LVH. Seen by PT and OT.  Inpatient rehabilitation is recommended.  Accelerated hypertension No previous history of hypertension although she does not follow-up with outpatient providers on a regular basis.  She does have LVH and echocardiogram which suggest that this has been a longstanding problem. Neurology recommends gradually normalizing blood pressure in 2 to 3 days.  Would ideally like to start with ACE inhibitor however her creatinine is elevated.  We will start her on amlodipine.    Moderate Pericardial Effusion Noted on echocardiogram. No tamponade features notes.  Patient denies  any chest pain.  No rubs heard on examination.  Repeat ECHO recommended prior to discharge.  Inflammatory markers unremarkable.  Chest x-ray was unremarkable.  TSH noted to be significantly elevated at 219 with a low free T4 which could be the reason for her pericardial effusion.  Will need a repeat echocardiogram prior to discharge.    Hypothyroidism, newly diagnosed TSH noted to be 219 with a free T4 of 0.32.  No evidence for any adrenal issues at this time.  No goiter noted.  Will check thyroid peroxidase antibodies.   Will initiate levothyroxine.  Since she is otherwise healthy we could start her at 1.6 mcg/kg/day dose which in her case comes to 180 mcg.  This may be too much to begin with.  We will start her on 100 mcg.  She will need repeat thyroid function tests in 4 weeks for further dose adjustments.  Elevated creatinine Presented with creatinine of 1.59.  No old labs noted in our system.  Nothing in Care Everywhere either.  Likely has some degree of chronic kidney disease, stage unspecified.   Monitor urine output.  Creatinine noted to be 1.72 today.  Recheck labs tomorrow.  UA shows mild proteinuria.  Was cloudy urine with large leukocytes and greater than 50 WBC.  Patient denies any dysuria.  No urinary frequency.  We will hold off on antibacterials.    Class III obesity Estimated body mass index is 42.4 kg/m as calculated from the following:   Height as of this encounter: 5\' 4"  (1.626 m).   Weight as of this encounter: 112 kg.  DVT Prophylaxis: Lovenox Code Status: Full code Family Communication: No family  at bedside Disposition Plan: Inpatient rehabilitation  Status is: Inpatient  Remains inpatient appropriate because: Acute stroke     Medications: Scheduled:   stroke: mapping our early stages of recovery book   Does not apply Once   aspirin EC  81 mg Oral Daily   atorvastatin  80 mg Oral Daily   clopidogrel  75 mg Oral Daily   enoxaparin (LOVENOX) injection  50 mg  Subcutaneous Q24H   sodium chloride flush  3 mL Intravenous Once   Continuous:   [id number] **OR** acetaminophen (TYLENOL) oral liquid 160 mg/5 mL **OR** acetaminophen  Antibiotics: Anti-infectives (From admission, onward)    None       Objective:  Vital Signs  Vitals:   04/05/21 2326 04/06/21 0439 04/06/21 0748 04/06/21 0935  BP: (!) 162/98 (!) 149/114 (!) 199/182 (!) 152/93  Pulse: 83 80 79   Resp: 18 18 20    Temp: 97.9 F (36.6 C) (!) 97.3 F (36.3 C)    TempSrc: Oral Oral    SpO2: 96% 99% 97% 98%  Weight:      Height:        Intake/Output Summary (Last 24 hours) at 04/06/2021 1127 Last data filed at 04/06/2021 0400 Gross per 24 hour  Intake 921.33 ml  Output 400 ml  Net 521.33 ml   Filed Weights   04/04/21 1937  Weight: 112 kg    General appearance: Awake alert.  In no distress Resp: Clear to auscultation bilaterally.  Normal effort Cardio: S1-S2 is normal regular.  No S3-S4.  No rubs murmurs or bruit GI: Abdomen is soft.  Nontender nondistended.  Bowel sounds are present normal.  No masses organomegaly Extremities: No edema.  Full range of motion of lower extremities. Neurologic: Alert and oriented x3.  Right-sided weakness noted.   Lab Results:  Data Reviewed: I have personally reviewed following labs and imaging studies  CBC: Recent Labs  Lab 04/04/21 1630 04/04/21 1647 04/05/21 0457 04/06/21 0244  WBC 7.6  --  9.0 8.0  NEUTROABS 5.2  --   --   --   HGB 14.0 14.6 14.1 13.1  HCT 43.4 43.0 42.7 40.7  MCV 92.1  --  91.4 91.7  PLT 252  --  236 228     Basic Metabolic Panel: Recent Labs  Lab 04/04/21 1630 04/04/21 1647 04/05/21 0457 04/06/21 0244  NA 138 139  --  138  K 4.0 4.0  --  4.0  CL 99 100  --  102  CO2 28  --   --  28  GLUCOSE 97 93  --  104*  BUN 10 11  --  13  CREATININE 1.59* 1.60* 1.56* 1.72*  CALCIUM 9.6  --   --  9.0     GFR: Estimated Creatinine Clearance: 43.1 mL/min (A) (by C-G formula based on SCr  of 1.72 mg/dL (H)).  Liver Function Tests: Recent Labs  Lab 04/04/21 1630  AST 27  ALT 14  ALKPHOS 57  BILITOT 0.8  PROT 7.7  ALBUMIN 4.4      Coagulation Profile: Recent Labs  Lab 04/04/21 1630  INR 1.0      CBG: Recent Labs  Lab 04/04/21 1633  GLUCAP 101*     Lipid Profile: Recent Labs    04/05/21 0457  CHOL 353*  HDL 49  LDLCALC 259*  TRIG 227*  CHOLHDL 7.2       Recent Results (from the past 240 hour(s))  Resp Panel by RT-PCR (Flu A&B, Covid)  Nasopharyngeal Swab     Status: None   Collection Time: 04/05/21  4:57 AM   Specimen: Nasopharyngeal Swab; Nasopharyngeal(NP) swabs in vial transport medium  Result Value Ref Range Status   SARS Coronavirus 2 by RT PCR NEGATIVE NEGATIVE Final    Comment: (NOTE) SARS-CoV-2 target nucleic acids are NOT DETECTED.  The SARS-CoV-2 RNA is generally detectable in upper respiratory specimens during the acute phase of infection. The lowest concentration of SARS-CoV-2 viral copies this assay can detect is 138 copies/mL. A negative result does not preclude SARS-Cov-2 infection and should not be used as the sole basis for treatment or other patient management decisions. A negative result may occur with  improper specimen collection/handling, submission of specimen other than nasopharyngeal swab, presence of viral mutation(s) within the areas targeted by this assay, and inadequate number of viral copies(<138 copies/mL). A negative result must be combined with clinical observations, patient history, and epidemiological information. The expected result is Negative.  Fact Sheet for Patients:  EntrepreneurPulse.com.au  Fact Sheet for Healthcare Providers:  IncredibleEmployment.be  This test is no t yet approved or cleared by the [place] FDA and  has been authorized for detection and/or diagnosis of SARS-CoV-2 by FDA under an Emergency Use Authorization (EUA). This EUA will  remain  in effect (meaning this test can be used) for the duration of the COVID-19 declaration under Section 564(b)(1) of the Act, 21 U.S.C.section 360bbb-3(b)(1), unless the authorization is terminated  or revoked sooner.       Influenza A by PCR NEGATIVE NEGATIVE Final   Influenza B by PCR NEGATIVE NEGATIVE Final    Comment: (NOTE) The Xpert Xpress SARS-CoV-2/FLU/RSV plus assay is intended as an aid in the diagnosis of influenza from Nasopharyngeal swab specimens and should not be used as a sole basis for treatment. Nasal washings and aspirates are unacceptable for Xpert Xpress SARS-CoV-2/FLU/RSV testing.  Fact Sheet for Patients: EntrepreneurPulse.com.au  Fact Sheet for Healthcare Providers: IncredibleEmployment.be  This test is not yet approved or cleared by the [place] FDA and has been authorized for detection and/or diagnosis of SARS-CoV-2 by FDA under an Emergency Use Authorization (EUA). This EUA will remain in effect (meaning this test can be used) for the duration of the COVID-19 declaration under Section 564(b)(1) of the Act, 21 U.S.C. section 360bbb-3(b)(1), unless the authorization is terminated or revoked.  Performed at [facility], [place] [address] [place], [place] [address]        Radiology Studies: DG Pelvis 1-2 Views  Result Date: 04/05/2021 CLINICAL DATA:  Fall EXAM: PELVIS - 1-2 VIEW COMPARISON:  None. FINDINGS: There is no evidence of pelvic fracture or diastasis. No pelvic bone lesions are seen. IMPRESSION: Negative Electronically Signed   By: Jorje Guild M.D.   On: 04/05/2021 06:01   CT HEAD WO CONTRAST  Result Date: 04/04/2021 CLINICAL DATA:  Neuro deficit, acute, stroke suspected. Right-sided weakness. EXAM: CT HEAD WITHOUT CONTRAST TECHNIQUE: Contiguous axial images were obtained from the base of the skull through the vertex without intravenous contrast. COMPARISON:  None. FINDINGS: Brain: Age  indeterminate right periventricular lacunar infarct. Mild chronic small vessel disease throughout the deep white matter. No hemorrhage or hydrocephalus. Vascular: No hyperdense vessel or unexpected calcification. Skull: No acute calvarial abnormality. 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 Chronic small vessel disease. Electronically Signed   By: Rolm Baptise M.D.   On: 04/04/2021 18:36   MR ANGIO HEAD WO CONTRAST  Result Date:  04/05/2021 CLINICAL DATA:  Follow-up examination for acute stroke. EXAM: MRA HEAD WITHOUT CONTRAST TECHNIQUE: Angiographic images of the Circle of Willis were acquired using MRA technique without intravenous contrast. COMPARISON:  Prior brain MRI from 04/04/2021 FINDINGS: Anterior circulation: Examination moderately degraded by motion artifact. Petrous, cavernous, and supraclinoid segments of both internal carotid arteries patent without flow-limiting stenosis. A1 segments patent bilaterally. Normal anterior communicating artery complex. Left ACA patent to its distal aspect without stenosis. There is an apparent focal severe distal right A2 stenosis (series 1063, image 4). This is also seen on source time-of-flight sequence (series 5, image 110). Right ACA otherwise patent. M1 segments patent without significant stenosis. Negative MCA bifurcations. Distal MCA branches perfused and symmetric. Suspected distal small vessel atheromatous irregularity. Posterior circulation: Visualized distal V4 segments patent without stenosis. Right vertebral artery slightly dominant. Right PICA origin patent. Left PICA origin not seen on this exam. Basilar grossly patent to its distal aspect without significant stenosis. Right SCA patent. There is question of a focal severe stenosis at the origin of the left SCA (series 1051, image 4). Both PCAs primarily supplied via the basilar. PCAs patent to their distal aspects without  significant stenosis. Anatomic variants: None significant. Other: No intracranial aneurysm or other vascular abnormality. IMPRESSION: 1. Motion degraded exam. 2. Negative intracranial MRA for large vessel occlusion. 3. Severe right A2 stenosis. 4. Question additional severe stenosis at the origin of the left SCA. 5. No other hemodynamically significant or correctable stenosis. Electronically Signed   By: Jeannine Boga M.D.   On: 04/05/2021 04:25   MR BRAIN WO CONTRAST  Result Date: 04/05/2021 CLINICAL DATA:  Initial evaluation for acute stroke, right-sided paresthesias for 24 hours. EXAM: MRI HEAD WITHOUT CONTRAST TECHNIQUE: Multiplanar, multiecho pulse sequences of the brain and surrounding structures were obtained without intravenous contrast. COMPARISON:  Head CT from earlier the same day. FINDINGS: Brain: Cerebral volume within normal limits. Mild chronic microvascular ischemic disease noted involving the supratentorial cerebral white matter. Remote lacunar infarct at the right basal ganglia/corona radiata. 1 cm focus of restricted diffusion seen involving the posterior limb of the left internal capsule, consistent with a acute ischemic infarct (series 5, image 72). No associated hemorrhage or mass effect. No other evidence for acute or subacute ischemia. Gray-white matter differentiation otherwise maintained. No acute intracranial hemorrhage. Few small chronic micro hemorrhages noted involving the left thalamus, likely hypertensive in nature. No mass lesion or mass effect. No midline shift or hydrocephalus. No extra-axial fluid collection. Pituitary gland suprasellar region within normal limits. Midline structures intact. Vascular: Abnormal flow void within the distal right vertebral artery within the neck, likely occluded (series 10, image 1). Preserved but mildly heterogeneous flow seen within the right V4 segment distally, which could be retrograde in nature. Major intracranial vascular flow  voids otherwise maintained. Skull and upper cervical spine: Craniocervical junction within normal limits. Bone marrow signal intensity normal. No scalp soft tissue abnormality. Sinuses/Orbits: Globes normal soft tissues demonstrate no acute finding. Paranasal sinuses are largely clear. No significant mastoid effusion. Other: None. IMPRESSION: 1. 1 cm acute ischemic nonhemorrhagic infarct involving the posterior limb of the left internal capsule. 2. Remote lacunar infarct at the right basal ganglia/corona radiata. 3. Underlying mild chronic microvascular ischemic disease. 4. Absent flow void within the right vertebral artery within the neck, likely occluded. Electronically Signed   By: Jeannine Boga M.D.   On: 04/05/2021 01:06   MR Cervical Spine Wo Contrast  Result Date: 04/05/2021 CLINICAL DATA:  Initial evaluation  for cervical radiculopathy, right arm and leg paresthesias. EXAM: MRI CERVICAL SPINE WITHOUT CONTRAST TECHNIQUE: Multiplanar, multisequence MR imaging of the cervical spine was performed. No intravenous contrast was administered. COMPARISON:  None available. FINDINGS: Alignment: Straightening with slight reversal of the normal cervical lordosis. No listhesis. Vertebrae: Vertebral body height maintained without acute or chronic fracture. Bone marrow signal intensity within normal limits. No discrete or worrisome osseous lesions. No abnormal marrow edema. Cord: Normal signal and morphology. Posterior Fossa, vertebral arteries, paraspinal tissues: Craniocervical junction within normal limits. Paraspinous soft tissues demonstrate no acute finding. 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 Disc levels: C2-C3: Negative interspace. Mild left-sided facet hypertrophy. No stenosis. C3-C4: Mild endplate spurring without significant disc bulge. Mild facet hypertrophy. No spinal stenosis. Mild bilateral C4 foraminal narrowing. C4-C5: Mild disc bulge with uncovertebral  hypertrophy. Right-sided facet degeneration. No spinal stenosis. Mild left C5 foraminal narrowing. Right neural foramina remains patent. C5-C6: Degenerative intervertebral disc space narrowing. Left paracentral disc protrusion indents the left ventral thecal sac, contacting and mildly flattening the ventral cord (series 8, image 29). Mild spinal stenosis. Superimposed uncovertebral spurring with resultant mild right C6 foraminal narrowing. Left neural foramina remains patent. C6-C7: Degenerative intervertebral disc space narrowing with diffuse disc bulge, endplate spurring, and uncovertebral hypertrophy. Broad posterior disc osteophyte flattens and partially faces the ventral thecal sac with resultant mild spinal stenosis. Foramina remain patent. C7-T1: Negative interspace. Left greater than right facet hypertrophy. No spinal stenosis. Mild left C8 foraminal narrowing. Right neural foramina remains patent. Visualized upper thoracic spine demonstrates no significant finding. IMPRESSION: 1. Left paracentral disc protrusion at C5-6 contacting and mildly flattening the left hemi cord and resulting in mild spinal stenosis. 2. Degenerative disc osteophyte at C6-7 with resultant mild spinal stenosis. 3. Mild bilateral C4, left C5, right C6, and left C8 foraminal stenosis related to disc bulging and uncovertebral disease. 4. 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 Electronically Signed   By: Jeannine Boga M.D.   On: 04/05/2021 01:24   DG CHEST PORT 1 VIEW  Result Date: 04/05/2021 CLINICAL DATA:  Pericardial effusion. EXAM: PORTABLE CHEST 1 VIEW COMPARISON:  None. FINDINGS: Cardiac and mediastinal contours are within normal limits. No focal pulmonary opacity. No pleural effusion or pneumothorax. No acute osseous abnormality. IMPRESSION: No acute cardiopulmonary process. No definite evidence of pericardial effusion. Electronically Signed   By: Merilyn Baba M.D.   On: 04/05/2021 14:57    ECHOCARDIOGRAM COMPLETE  Result Date: 04/05/2021    ECHOCARDIOGRAM REPORT   Patient Name:   Christine Fritz Date of Exam: 04/05/2021 Medical Rec #:  [id number]     Height:       64.0 in Accession #:    [id number]    Weight:       247.0 lb Date of Birth:  Jan 25, 1961      BSA:          2.140 m Patient Age:    83 years      BP:           155/109 mmHg Patient Gender: F             HR:           81 bpm. Exam Location:  Inpatient Procedure: 2D Echo, Cardiac Doppler and Color Doppler Indications:    Stroke  History:        Patient has no prior history of Echocardiogram examinations.  Acute CVA.  Sonographer:    Glo Herring Referring Phys: [place]  Sonographer Comments: Technically difficult study due to poor echo windows. IMPRESSIONS  1. Left ventricular ejection fraction, by estimation, is 60 to 65%. The left ventricle has normal function. The left ventricle has no regional wall motion abnormalities. There is severe concentric left ventricular hypertrophy. Left ventricular diastolic  parameters are indeterminate.  2. Right ventricular systolic function is normal. The right ventricular size is normal. Moderately increased right ventricular wall thickness.  3. Moderate pericardial effusion, located anterior of the right venticle. The right ventricle does not expand fully suggesting increased pericardial pressure but there is no evidence of tamponade. 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  4. The mitral valve is normal in structure. No evidence of mitral valve regurgitation. No evidence of mitral stenosis.  5. The aortic valve is normal in structure. Aortic valve regurgitation is not visualized. No aortic stenosis is present.  6. The inferior vena cava is normal in size with greater than 50% respiratory variability, suggesting right atrial pressure of 3 mmHg. Conclusion(s)/Recommendation(s): Due to findings of Moderate pericardial  effusion with no evidence of tamponande, a follow up echocardiogram is recommended in prior to discharge. FINDINGS  Left Ventricle: Left ventricular ejection fraction, by estimation, is 60 to 65%. The left ventricle has normal function. The left ventricle has no regional wall motion abnormalities. The left ventricular internal cavity size was normal in size. There is  severe concentric left ventricular hypertrophy. Left ventricular diastolic parameters are indeterminate. Right Ventricle: The right ventricular size is normal. Moderately increased right ventricular wall thickness. Right ventricular systolic function is normal. Left Atrium: Left atrial size was normal in size. Right Atrium: Right atrial size was normal in size. Pericardium: The right ventricle does not expand fully suggesting increased pericardial pressure but there is no evidence of tamponade. 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 A moderately sized pericardial effusion is present. The pericardial effusion is anterior to the right ventricle. Presence of epicardial fat layer. Mitral Valve: The mitral valve is normal in structure. No evidence of mitral valve regurgitation. No evidence of mitral valve stenosis. Tricuspid Valve: The tricuspid valve is normal in structure. Tricuspid valve regurgitation is not demonstrated. No evidence of tricuspid stenosis. Aortic Valve: The aortic valve is normal in structure. Aortic valve regurgitation is not visualized. No aortic stenosis is present. Aortic valve mean gradient measures 4.0 mmHg. Aortic valve peak gradient measures 6.7 mmHg. Aortic valve area, by VTI measures 2.53 cm. Pulmonic Valve: The pulmonic valve was normal in structure. Pulmonic valve regurgitation is not visualized. No evidence of pulmonic stenosis. Aorta: The aortic root is normal in size and structure. Venous: The inferior vena cava is normal in size with greater than 50% respiratory  variability, suggesting right atrial pressure of 3 mmHg. IAS/Shunts: No atrial level shunt detected by color flow Doppler.  LEFT VENTRICLE PLAX 2D LVIDd:         3.50 cm   Diastology LVIDs:         2.50 cm   LV e' medial:    5.33 cm/s LV PW:         1.70 cm   LV E/e' medial:  11.7 LV IVS:        1.70 cm   LV e' lateral:   3.81 cm/s LVOT diam:     1.90 cm   LV E/e'  lateral: 16.4 LV SV:         68 LV SV Index:   32 LVOT Area:     2.84 cm  IVC IVC diam: 1.60 cm LEFT ATRIUM             Index LA diam:        3.40 cm 1.59 cm/m LA Vol (A2C):   38.8 ml 18.13 ml/m LA Vol (A4C):   33.3 ml 15.56 ml/m LA Biplane Vol: 37.6 ml 17.57 ml/m  AORTIC VALVE                    PULMONIC VALVE AV Area (Vmax):    3.03 cm     PV Vmax:       1.22 m/s AV Area (Vmean):   2.74 cm     PV Peak grad:  6.0 mmHg AV Area (VTI):     2.53 cm AV Vmax:           129.00 cm/s AV Vmean:          91.400 cm/s AV VTI:            0.268 m AV Peak Grad:      6.7 mmHg AV Mean Grad:      4.0 mmHg LVOT Vmax:         138.00 cm/s LVOT Vmean:        88.200 cm/s LVOT VTI:          0.239 m LVOT/AV VTI ratio: 0.89  AORTA Ao Root diam: 3.20 cm Ao Asc diam:  3.30 cm MITRAL VALVE MV Area (PHT): 4.83 cm    SHUNTS MV Decel Time: 157 msec    Systemic VTI:  0.24 m MV E velocity: 62.40 cm/s  Systemic Diam: 1.90 cm MV A velocity: 84.80 cm/s MV E/A ratio:  0.74 Kardie Tobb DO Electronically signed by Thomasene Ripple DO Signature Date/Time: 04/05/2021/12:48:59 PM    Final    VAS US CAROTID (at [facility] and [facility] only)  Result Date: 04/05/2021 Carotid Arterial Duplex Study Patient Name:  Christine Fritz  Date of Exam:   04/05/2021 Medical Rec #: [id number]      Accession #:    [id number] Date of Birth: Mar 04, 1961       Patient Gender: F Patient Age:   60 years Exam Location:  [facility] Procedure:      VAS US CAROTID Referring Phys: Midge Minium --------------------------------------------------------------------------------  Indications:       CVA. Comparison Study:  no  prior Performing Technologist: [place] Ponder RVS  Examination Guidelines: A complete evaluation includes B-mode imaging, spectral Doppler, color Doppler, and power Doppler as needed of all accessible portions of each vessel. Bilateral testing is considered an integral part of a complete examination. Limited examinations for reoccurring indications may be performed as noted.  Right Carotid Findings: +----------+--------+--------+--------+------------------+--------+           PSV cm/sEDV cm/sStenosisPlaque DescriptionComments +----------+--------+--------+--------+------------------+--------+ CCA Prox  50      12              heterogenous               +----------+--------+--------+--------+------------------+--------+ CCA Distal65      14              heterogenous               +----------+--------+--------+--------+------------------+--------+ ICA Prox  51      18      1-39%  heterogenous               +----------+--------+--------+--------+------------------+--------+ ICA Distal87      26                                         +----------+--------+--------+--------+------------------+--------+ ECA       261     17                                         +----------+--------+--------+--------+------------------+--------+ +----------+--------+-------+--------+-------------------+           PSV cm/sEDV cmsDescribeArm Pressure (mmHG) +----------+--------+-------+--------+-------------------+ [id number]                                        +----------+--------+-------+--------+-------------------+ +---------+--------+--+--------+-+---------+ VertebralPSV cm/s37EDV cm/s9Antegrade +---------+--------+--+--------+-+---------+  Left Carotid Findings: +----------+--------+--------+--------+------------------+--------+           PSV cm/sEDV cm/sStenosisPlaque DescriptionComments +----------+--------+--------+--------+------------------+--------+  CCA Prox  87      13              heterogenous               +----------+--------+--------+--------+------------------+--------+ CCA Distal67      12              heterogenous               +----------+--------+--------+--------+------------------+--------+ ICA Prox  44      14      1-39%   heterogenous               +----------+--------+--------+--------+------------------+--------+ ICA Distal87      26                                         +----------+--------+--------+--------+------------------+--------+ ECA       81      6                                          +----------+--------+--------+--------+------------------+--------+ +----------+--------+--------+--------+-------------------+           PSV cm/sEDV cm/sDescribeArm Pressure (mmHG) +----------+--------+--------+--------+-------------------+ [id number]                                         +----------+--------+--------+--------+-------------------+ +---------+--------+--+--------+--+---------+ VertebralPSV cm/s39EDV cm/s13Antegrade +---------+--------+--+--------+--+---------+   Summary: Right Carotid: Velocities in the right ICA are consistent with a 1-39% stenosis. Left Carotid: Velocities in the left ICA are consistent with a 1-39% stenosis. Vertebrals: Bilateral vertebral arteries demonstrate antegrade flow. *See table(s) above for measurements and observations.     Preliminary        LOS: 1 day   Annita Ratliff [employer] on [email or web address]  04/06/2021, 11:27 AM

## 2021-04-07 LAB — MAGNESIUM: Magnesium: 2.3 mg/dL (ref 1.7–2.4)

## 2021-04-07 LAB — BASIC METABOLIC PANEL
Anion gap: 7 (ref 5–15)
BUN: 19 mg/dL (ref 6–20)
CO2: 25 mmol/L (ref 22–32)
Calcium: 8.8 mg/dL — ABNORMAL LOW (ref 8.9–10.3)
Chloride: 101 mmol/L (ref 98–111)
Creatinine, Ser: 1.73 mg/dL — ABNORMAL HIGH (ref 0.44–1.00)
GFR, Estimated: 34 mL/min — ABNORMAL LOW (ref >60)
Glucose, Bld: 101 mg/dL — ABNORMAL HIGH (ref 70–99)
Potassium: 3.1 mmol/L — ABNORMAL LOW (ref 3.5–5.1)
Sodium: 133 mmol/L — ABNORMAL LOW (ref 135–145)

## 2021-04-07 LAB — THYROID PEROXIDASE ANTIBODY: Thyroperoxidase Ab SerPl-aCnc: 434 IU/mL — ABNORMAL HIGH (ref 0–34)

## 2021-04-07 MED ORDER — POTASSIUM CHLORIDE CRYS ER 20 MEQ PO TBCR
30.0000 meq | EXTENDED_RELEASE_TABLET | ORAL | Status: AC
  Administered 2021-04-07 (×2): 30 meq via ORAL
  Filled 2021-04-07 (×2): qty 1

## 2021-04-07 NOTE — Progress Notes (Signed)
Physical Therapy Treatment Patient Details Name: Christine Fritz MRN: [id number] DOB: 1961/05/18 Today's Date: 04/07/2021   History of Present Illness Pt is a 60 y/o female admitted secondary to R extremity deficits. 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 No pertinent PMH.    PT Comments    Patient ambulated into the hallway this session, but needed greatly increased time, step by step cues for posture, sequencing walker, stepping and up to mod A for balance and walker management.  She was able to toilet on BSC.  Encouraged her to continue progressing as she is easily frustrated with herself, but showing good progress.  PT to continue to follow acutely.  Continue to feel she is excellent candidate for acute inpatient rehab prior to d/c home.    Recommendations for follow up therapy are one component of a multi-disciplinary discharge planning process, led by the attending physician.  Recommendations may be updated based on patient status, additional functional criteria and insurance authorization.  Follow Up Recommendations  Acute inpatient rehab (3hours/day)     Assistance Recommended at Discharge Frequent or constant Supervision/Assistance  Equipment Recommendations  Rolling walker (2 wheels)    Recommendations for Other Services       Precautions / Restrictions Precautions Precautions: Fall Precaution Comments: R weakness, incoordination Restrictions Weight Bearing Restrictions: No     Mobility  Bed Mobility Overal bed mobility: Needs Assistance Bed Mobility: Supine to Sit     Supine to sit: Min assist     General bed mobility comments: up in recliner    Transfers Overall transfer level: Needs assistance Equipment used: Rolling walker (2 wheels) Transfers: Sit to/from Stand;Bed to chair/wheelchair/BSC Sit to Stand: Min assist     Step pivot transfers: Mod assist     General transfer comment: up to stand from recliner and BSC increased time and cues for  positioning; step to and from [facility] with RW, increased time mod cues and assist for balance, walker management    Ambulation/Gait Ambulation/Gait assistance: Mod assist;Min assist Gait Distance (Feet): 45 Feet Assistive device: Rolling walker (2 wheels) Gait Pattern/deviations: Step-to pattern;Decreased dorsiflexion - right;Decreased weight shift to left;Shuffle;Decreased step length - right;Trunk flexed       General Gait Details: Cues for sequencing with walker and step to pattern with increased time and at times clearance for R foot, help for walker management at times and balance and especially with turns   Stairs             Wheelchair Mobility    Modified Rankin (Stroke Patients Only) Modified Rankin (Stroke Patients Only) Pre-Morbid Rankin Score: No symptoms Modified Rankin: Moderately severe disability     Balance Overall balance assessment: Needs assistance Sitting-balance support: Feet supported Sitting balance-Leahy Scale: Fair     Standing balance support: During functional activity;Single extremity supported Standing balance-Leahy Scale: Poor Standing balance comment: mod assist for balance while pt using L hand to wipe after toileting                            Cognition Arousal/Alertness: Awake/alert Behavior During Therapy: WFL for tasks assessed/performed (easily tearful) Overall Cognitive Status: Impaired/Different from baseline Area of Impairment: Safety/judgement                   Current Attention Level: Sustained     Safety/Judgement: Decreased awareness of safety   Problem Solving: Slow processing;Requires verbal cues;Requires tactile cues General Comments: Slow processing, required verbal  cues for safety wtih OOB tasks. Pt a little emotional during session due to deficits        Exercises Hand Activities Folding Paper: Right;10 reps;Seated Turning Pages: Right;10 reps;Seated Paper Clips on Rim of Cup: Right;10  reps;Seated Other Exercises Other Exercises: encouraged hand writing practice, paper folding, paper tearing, picking up small objects & placing into cup, finger <>palm translation    General Comments General comments (skin integrity, edema, etc.): Encouragement regarding process of recovery and for patience with herself and to stop saying, "I'm sorry"      Pertinent Vitals/Pain Pain Assessment: No/denies pain    Home Living                          Prior Function            PT Goals (current goals can now be found in the care plan section) Progress towards PT goals: Progressing toward goals    Frequency    Min 4X/week      PT Plan Current plan remains appropriate    Co-evaluation              AM-PAC PT "6 Clicks" Mobility   Outcome Measure  Help needed turning from your back to your side while in a flat bed without using bedrails?: A Little Help needed moving from lying on your back to sitting on the side of a flat bed without using bedrails?: A Little Help needed moving to and from a bed to a chair (including a wheelchair)?: A Lot Help needed standing up from a chair using your arms (e.g., wheelchair or bedside chair)?: A Little Help needed to walk in hospital room?: A Lot Help needed climbing 3-5 steps with a railing? : Total 6 Click Score: 14    End of Session Equipment Utilized During Treatment: Gait belt Activity Tolerance: Patient limited by fatigue Patient left: in chair;with call bell/phone within reach;with nursing/sitter in room   PT Visit Diagnosis: Other abnormalities of gait and mobility (R26.89);Hemiplegia and hemiparesis;History of falling (Z91.81) Hemiplegia - Right/Left: Right Hemiplegia - dominant/non-dominant: Dominant Hemiplegia - caused by: Cerebral infarction     Time: 0539-7673 PT Time Calculation (min) (ACUTE ONLY): 29 min  Charges:  $Gait Training: 8-22 mins $Therapeutic Activity: 8-22 mins                      Sheran Lawless, PT Acute Rehabilitation Services [phone] [phone] 04/07/2021    [name] 04/07/2021, 5:14 PM

## 2021-04-07 NOTE — Progress Notes (Signed)
Inpatient Rehabilitation Admissions Coordinator   I have received approval with insurance for CIR admit. I met at bedside with patient and she is aware. I await bed availability.  Danne Baxter, RN, MSN Rehab Admissions Coordinator [phone] 04/07/2021 6:54 PM

## 2021-04-07 NOTE — Plan of Care (Signed)
Pt is alert oriented x 4. Pt has purewick which has been changed. Pt has right sided weakness. Denies pain.    Problem: Education: Goal: Knowledge of General Education information will improve Description: Including pain rating scale, medication(s)/side effects and non-pharmacologic comfort measures Outcome: Progressing   Problem: Health Behavior/Discharge Planning: Goal: Ability to manage health-related needs will improve Outcome: Progressing   Problem: Clinical Measurements: Goal: Ability to maintain clinical measurements within normal limits will improve Outcome: Progressing Goal: Will remain free from infection Outcome: Progressing Goal: Diagnostic test results will improve Outcome: Progressing Goal: Respiratory complications will improve Outcome: Progressing Goal: Cardiovascular complication will be avoided Outcome: Progressing   Problem: Activity: Goal: Risk for activity intolerance will decrease Outcome: Progressing   Problem: Nutrition: Goal: Adequate nutrition will be maintained Outcome: Progressing   Problem: Coping: Goal: Level of anxiety will decrease Outcome: Progressing   Problem: Elimination: Goal: Will not experience complications related to bowel motility Outcome: Progressing Goal: Will not experience complications related to urinary retention Outcome: Progressing   Problem: Pain Managment: Goal: General experience of comfort will improve Outcome: Progressing   Problem: Safety: Goal: Ability to remain free from injury will improve Outcome: Progressing   Problem: Skin Integrity: Goal: Risk for impaired skin integrity will decrease Outcome: Progressing   Problem: Education: Goal: Knowledge of disease or condition will improve Outcome: Progressing Goal: Knowledge of secondary prevention will improve (SELECT ALL) Outcome: Progressing Goal: Knowledge of patient specific risk factors will improve (INDIVIDUALIZE FOR PATIENT) Outcome: Progressing    Problem: Coping: Goal: Will verbalize positive feelings about self Outcome: Progressing Goal: Will identify appropriate support needs Outcome: Progressing   Problem: Health Behavior/Discharge Planning: Goal: Ability to manage health-related needs will improve Outcome: Progressing   Problem: Self-Care: Goal: Ability to participate in self-care as condition permits will improve Outcome: Progressing Goal: Verbalization of feelings and concerns over difficulty with self-care will improve Outcome: Progressing Goal: Ability to communicate needs accurately will improve Outcome: Progressing   Problem: Nutrition: Goal: Risk of aspiration will decrease Outcome: Progressing   Problem: Ischemic Stroke/TIA Tissue Perfusion: Goal: Complications of ischemic stroke/TIA will be minimized Outcome: Progressing

## 2021-04-07 NOTE — PMR Pre-admission (Signed)
PMR Admission Coordinator Pre-Admission Assessment  Patient: Christine Fritz is an 60 y.o., female MRN: [id number] DOB: Jan 22, 1961 Height: 5' 4"  (162.6 cm) Weight: 112 kg  Insurance Information HMO:     PPO:      PCP:      IPA:      80/20:      OTHER:  PRIMARYRoseanna Rainbow      Policy#: [id number]      Subscriber: pt CM Name: April      Phone#: [phone]     Fax#: [phone] Pre-Cert#: [id number]       Employer:  Benefits:  Phone #: [phone]     Name: 11/16 Eff. Date: 06/03/2000     Deduct: none      Out of Pocket Max: $6500      Life Max: none CIR: $175 co pay per day days 1 until 5      SNF: no coverage Outpatient: $30 co pay per visit     Co-Pay: visits per medcail neccesity Home Health: 70%      Co-Pay: visits per medcial neccesity DME: 70%     Co-Pay: 30% Providers: in network  SECONDARY: none      Policy#:      Phone#:   Development worker, community:       Phone#:   The Engineer, petroleum" for patients in Inpatient Rehabilitation Facilities with attached "Privacy Act [place] Records" was provided and verbally reviewed with: N/A  Emergency Contact Information Contact Information     Name Relation Home Work [employer] [phone]  [phone]   Pitones,[name] Daughter   [phone]      Current Medical History  Patient Admitting Diagnosis: CVA  History of Present Illness:  60 year old right-handed female with unremarkable past medical history except obesity with BMI 42.40 who has not been to a doctor for more than 20 years.    Presented 04/04/2021 with with transient right side weakness.  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  Remote lacunar infarct of the right basal ganglia/corona radiata.  MRI cervical spine left paracentral disc protrusion at C5-6 contacting and mildly flattening the left hemicord resulting in mild spinal stenosis.  MRA negative for  large vessel occlusion.  Noted severe right A2 stenosis.  Admission chemistries unremarkable except TSH 219.795 creatinine 1.59, urine drug screen negative, LDL 259.  Echocardiogram ejection fraction of 60 to 65%, moderate pericardial effusion no wall motion abnormalities.  Placed on aspirin 81 mg daily and Plavix 75 mg day x3 weeks then aspirin alone for CVA prophylaxis.  Maintain on Lovenox for DVT prophylaxis.  She was placed on Synthroid for findings of elevated TSH.  Therapy evaluations completed due to patient's right side weakness decreased functional mobility was admitted for a comprehensive rehab program.   Complete NIHSS TOTAL: 4  Patient's medical record from [facility] has been reviewed by the rehabilitation admission coordinator and physician.  Past Medical History  History reviewed. No pertinent past medical history.  Has the patient had major surgery during 100 days prior to admission? No  Family History   family history includes Hypertension in her father.  Current Medications  Current Facility-Administered Medications:     stroke: mapping our early stages of recovery book, , Does not apply, Once, Rise Patience, MD   acetaminophen (TYLENOL) tablet 650 mg, 650 mg, Oral, Q4H PRN, 650 mg at 04/07/21 2204 **OR** acetaminophen (TYLENOL)  160 MG/5ML solution 650 mg, 650 mg, Per Tube, Q4H PRN **OR** acetaminophen (TYLENOL) suppository 650 mg, 650 mg, Rectal, Q4H PRN, Rise Patience, MD   amLODipine (NORVASC) tablet 5 mg, 5 mg, Oral, Daily, Bonnielee Haff, MD, 5 mg at 04/07/21 0841   aspirin EC tablet 81 mg, 81 mg, Oral, Daily, Rosalin Hawking, MD, 81 mg at 04/07/21 0840   atorvastatin (LIPITOR) tablet 80 mg, 80 mg, Oral, Daily, Rosalin Hawking, MD, 80 mg at 04/07/21 0841   clopidogrel (PLAVIX) tablet 75 mg, 75 mg, Oral, Daily, Rosalin Hawking, MD, 75 mg at 04/07/21 0840   enoxaparin (LOVENOX) injection 50 mg, 50 mg, Subcutaneous, Q24H, Rise Patience, MD, 50 mg at  04/07/21 0841   levothyroxine (SYNTHROID) tablet 100 mcg, 100 mcg, Oral, Q0600, Bonnielee Haff, MD, 100 mcg at 04/08/21 0525   sodium chloride flush (NS) 0.9 % injection 3 mL, 3 mL, Intravenous, Once, Trifan, Carola Rhine, MD  Patients Current Diet:  Diet Order             Diet Heart Room service appropriate? Yes; Fluid consistency: Thin  Diet effective now                  Precautions / Restrictions Precautions Precautions: Fall Precaution Comments: R weakness, incoordination Restrictions Weight Bearing Restrictions: No   Has the patient had 2 or more falls or a fall with injury in the past year? No Did Fall in the ER when up without asisst  Prior Activity Level Community (5-7x/wk): independent and driving  Prior Functional Level Self Care: Did the patient need help bathing, dressing, using the toilet or eating? Independent  Indoor Mobility: Did the patient need assistance with walking from room to room (with or without device)? Independent  Stairs: Did the patient need assistance with internal or external stairs (with or without device)? Independent  Functional Cognition: Did the patient need help planning regular tasks such as shopping or remembering to take medications? Independent  Patient Information Are you of Hispanic, Latino/a,or Spanish origin?: A. No, not of Hispanic, Latino/a, or Spanish origin What is your race?: A. White Do you need or want an interpreter to communicate with a doctor or health care staff?: 0. No  Patient's Response To:  Health Literacy and Transportation Is the patient able to respond to health literacy and transportation needs?: Yes Health Literacy - How often do you need to have someone help you when you read instructions, pamphlets, or other written material from your doctor or pharmacy?: Never In the past 12 months, has lack of transportation kept you from medical appointments or from getting medications?: No In the past 12 months, has  lack of transportation kept you from meetings, work, or from getting things needed for daily living?: No  Home Assistive Devices / Equipment Home Equipment: None  Prior Device Use: Indicate devices/aids used by the patient prior to current illness, exacerbation or injury? None of the above  Current Functional Level Cognition  Arousal/Alertness: Awake/alert Overall Cognitive Status: Impaired/Different from baseline Current Attention Level: Sustained Orientation Level: Oriented X4 Safety/Judgement: Decreased awareness of safety General Comments: Slow processing, required verbal cues for safety wtih OOB tasks. Pt a little emotional during session due to deficits    Extremity Assessment (includes Sensation/Coordination)  Upper Extremity Assessment: RUE deficits/detail RUE Deficits / Details: AROM is WFL, sensation and proprioception seem intact with slow/deliberate movements. Sensory motor coordination is impaired. pt able to lift cup to mouth and type in Ipad wtih R hand  RUE Sensation: decreased light touch, decreased proprioception RUE Coordination: decreased fine motor  Lower Extremity Assessment: Defer to PT evaluation RLE Deficits / Details: Very uncoordinated during heel to shin test. Able to perform SLR with increased time. Strength grossly 4/5    ADLs  Overall ADL's : Needs assistance/impaired Eating/Feeding: Supervision/ safety, Set up Eating/Feeding Details (indicate cue type and reason): unable to feed self with dominant RUE Grooming: Minimal assistance, Sitting Grooming Details (indicate cue type and reason): min A for verbal cues to use RUE for fucntional tasks - educated on compensatory tehcniques to use RUE as a functional assist Upper Body Bathing: Minimal assistance Lower Body Bathing: Moderate assistance, Sit to/from stand Upper Body Dressing : Moderate assistance Lower Body Dressing: Moderate assistance, Sit to/from stand Toilet Transfer: Minimal assistance,  BSC/3in1, Rolling walker (2 wheels) Toilet Transfer Details (indicate cue type and reason): min A to rise and decend; verbal cues for safety and positioning Toileting- Clothing Manipulation and Hygiene: Maximal assistance Functional mobility during ADLs: Minimal assistance General ADL Comments: Spent increased time educating pt on importance of repetition of funtional tasks with RUE i.e. eating sandwich, drinking from cup (two hands if needed), brushing teeth, typing on ipad, etc.    Mobility  Overal bed mobility: Needs Assistance Bed Mobility: Supine to Sit Supine to sit: Min assist Sit to supine: Mod assist, +2 for safety/equipment General bed mobility comments: up in recliner    Transfers  Overall transfer level: Needs assistance Equipment used: Rolling walker (2 wheels) Transfers: Sit to/from Stand, Bed to chair/wheelchair/BSC Sit to Stand: Min assist Bed to/from chair/wheelchair/BSC transfer type:: Step pivot Step pivot transfers: Mod assist General transfer comment: up to stand from recliner and BSC increased time and cues for positioning; step to and from [facility] with RW, increased time mod cues and assist for balance, walker management    Ambulation / Gait / Stairs / Wheelchair Mobility  Ambulation/Gait Ambulation/Gait assistance: Mod assist, Min assist Gait Distance (Feet): 45 Feet Assistive device: Rolling walker (2 wheels) Gait Pattern/deviations: Step-to pattern, Decreased dorsiflexion - right, Decreased weight shift to left, Shuffle, Decreased step length - right, Trunk flexed General Gait Details: Cues for sequencing with walker and step to pattern with increased time and at times clearance for R foot, help for walker management at times and balance and especially with turns Gait velocity: decr    Posture / Balance Balance Overall balance assessment: Needs assistance Sitting-balance support: Feet supported Sitting balance-Leahy Scale: Fair Standing balance support: During  functional activity, Single extremity supported Standing balance-Leahy Scale: Poor Standing balance comment: mod assist for balance while pt using L hand to wipe after toileting    Special needs/care consideration New HTN diagnosis this admit   Previous Home Environment  Living Arrangements: Spouse/significant other, Other relatives  Lives With: Spouse Available Help at Discharge:  (spouse works but can have other family assist; has 5 adult children) Type of Home: House Home Layout: Able to live on main level with bedroom/bathroom, Two level Home Access: Stairs to enter Entrance Stairs-Rails: Right, Left Entrance Stairs-Number of Steps: 3 Bathroom Shower/Tub: Tub/shower unit, Industrial/product designer: Yes Home Care Services: No  Discharge Living Setting Plans for Discharge Living Setting: Patient's home, Lives with (comment) (spouse) Type of Home at Discharge: House Discharge Home Layout: One level Discharge Home Access: Stairs to enter Entrance Stairs-Rails: Right, Left Entrance Stairs-Number of Steps: 3 Discharge Bathroom Shower/Tub: Tub/shower unit, Curtain Discharge Bathroom Toilet: Standard Discharge Bathroom Accessibility: Yes How Accessible: Accessible  via walker Does the patient have any problems obtaining your medications?: No  Social/Family/Support Systems Patient Roles: Spouse, Parent Contact Information: spouse, Eddie Dibbles Anticipated Caregiver: spouse and other family members Anticipated Caregiver's Contact Information: see contacts Ability/Limitations of Caregiver: spouse works day shift Caregiver Availability: Intermittent (patient to ask adult children to provide intermittent assist when spouse works) Discharge Plan Discussed with Primary Caregiver: Yes Is Caregiver In Agreement with Plan?: Yes Does Caregiver/Family have Issues with Lodging/Transportation while Pt is in Rehab?: No  Goals Patient/Family Goal for Rehab: Mod I to  intermittent supervision with PT, OT and SLP Expected length of stay: ELOS 7 to 10 days Pt/Family Agrees to Admission and willing to participate: Yes Program Orientation Provided & Reviewed with Pt/Caregiver Including Roles  & Responsibilities: Yes  Decrease burden of Care through IP rehab admission: n/a  Possible need for SNF placement upon discharge: not anticipated  Patient Condition: I have reviewed medical records from [facility], spoken with CM, and patient. I met with patient at the bedside for inpatient rehabilitation assessment.  Patient will benefit from ongoing PT, OT, and SLP, can actively participate in 3 hours of therapy a day 5 days of the week, and can make measurable gains during the admission.  Patient will also benefit from the coordinated team approach during an Inpatient Acute Rehabilitation admission.  The patient will receive intensive therapy as well as Rehabilitation physician, nursing, social worker, and care management interventions.  Due to bladder management, bowel management, safety, skin/wound care, disease management, medication administration, pain management, and patient education the patient requires 24 hour a day rehabilitation nursing.  The patient is currently min asisst overall with mobility and basic ADLs.  Discharge setting and therapy post discharge at home with outpatient is anticipated.  Patient has agreed to participate in the Acute Inpatient Rehabilitation Program and will admit today.  Preadmission Screen Completed By:  Cleatrice Burke, 04/08/2021 10:30 AM ______________________________________________________________________   Discussed status with Dr. Naaman Plummer on 04/08/2021 at 1029 and received approval for admission today.  Admission Coordinator:  Cleatrice Burke, RN, time  9833 Date  04/08/2021   Assessment/Plan: Diagnosis: Left PLIC infarct Does the need for close, 24 hr/day Medical supervision in concert with the patient's  rehab needs make it unreasonable for this patient to be served in a less intensive setting? Yes Co-Morbidities requiring supervision/potential complications: obesity, HTN, constipation Due to bladder management, bowel management, safety, skin/wound care, disease management, medication administration, pain management, and patient education, does the patient require 24 hr/day rehab nursing? Yes Does the patient require coordinated care of a physician, rehab nurse, PT, OT, and SLP to address physical and functional deficits in the context of the above medical diagnosis(es)? Yes Addressing deficits in the following areas: balance, endurance, locomotion, strength, transferring, bowel/bladder control, bathing, dressing, feeding, grooming, cognition, speech, and psychosocial support Can the patient actively participate in an intensive therapy program of at least 3 hrs of therapy 5 days a week? Yes The potential for patient to make measurable gains while on inpatient rehab is excellent Anticipated functional outcomes upon discharge from inpatient rehab: modified independent and supervision PT, modified independent and supervision OT, independent SLP Estimated rehab length of stay to reach the above functional goals is: 7-10 days Anticipated discharge destination: Home 10. Overall Rehab/Functional Prognosis: excellent   MD Signature: Meredith Staggers, MD, [facility] 04/08/2021

## 2021-04-07 NOTE — Progress Notes (Signed)
Occupational Therapy Treatment Patient Details Name: Christine Fritz MRN: [id number] DOB: 02-12-1961 Today's Date: 04/07/2021   History of present illness Pt is a 60 y/o female admitted secondary to R extremity deficits. 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 No pertinent PMH.   OT comments  Pt is making functional progress towards her OT goals. Overall she required less assist for all tasks assessed this session: min A for bed mobility, min A for sit<>stand and min A for short functional ambulation with RW.  Pt completed multiple R hand exercises/activities and demonstrated great understanding (see below)  - handout left with pt to encourage continued RUE use. Also encouraged and educated pt to continue to use repetition with R hand to complete function tasks. Pt continues to benefit from OT acutely. D/c recommendation remains appropriate.   Recommendations for follow up therapy are one component of a multi-disciplinary discharge planning process, led by the attending physician.  Recommendations may be updated based on patient status, additional functional criteria and insurance authorization.    Follow Up Recommendations  Acute inpatient rehab (3hours/day)    Assistance Recommended at Discharge Intermittent Supervision/Assistance  Equipment Recommendations  BSC/3in1    Recommendations for Other Services Rehab consult    Precautions / Restrictions Precautions Precautions: Fall Restrictions Weight Bearing Restrictions: No       Mobility Bed Mobility Overal bed mobility: Needs Assistance Bed Mobility: Supine to Sit     Supine to sit: Min assist     General bed mobility comments: min A to elevate trunk    Transfers Overall transfer level: Needs assistance Equipment used: Rolling walker (2 wheels) Transfers: Sit to/from Stand Sit to Stand: Min assist           General transfer comment: min A to rise into standing, +time and effort required.     Balance Overall  balance assessment: History of Falls;Needs assistance Sitting-balance support: Feet supported Sitting balance-Leahy Scale: Fair     Standing balance support: Bilateral upper extremity supported;Reliant on assistive device for balance Standing balance-Leahy Scale: Fair                             ADL either performed or assessed with clinical judgement   ADL Overall ADL's : Needs assistance/impaired     Grooming: Minimal assistance;Sitting Grooming Details (indicate cue type and reason): min A for verbal cues to use RUE for fucntional tasks - educated on compensatory tehcniques to use RUE as a functional assist                 Toilet Transfer: Minimal assistance;BSC/3in1;Rolling walker (2 wheels) Toilet Transfer Details (indicate cue type and reason): min A to rise and decend; verbal cues for safety and positioning         Functional mobility during ADLs: Minimal assistance General ADL Comments: Spent increased time educating pt on importance of repetition of funtional tasks with RUE i.e. eating sandwich, drinking from cup (two hands if needed), brushing teeth, typing on ipad, etc.    Extremity/Trunk Assessment Upper Extremity Assessment RUE Deficits / Details: AROM is WFL, sensation and proprioception seem intact with slow/deliberate movements. Sensory motor coordination is impaired. pt able to lift cup to mouth and type in Ipad wtih R hand RUE Coordination: decreased fine motor   Lower Extremity Assessment Lower Extremity Assessment: Defer to PT evaluation        Vision   Vision Assessment?: No apparent visual deficits  Perception     Praxis      Cognition Arousal/Alertness: Awake/alert Behavior During Therapy: WFL for tasks assessed/performed Overall Cognitive Status: Within Functional Limits for tasks assessed                                 General Comments: Slow processing, required verbal cues for safety wtih OOB tasks. Pt a  little emotional during session due to deficits          Exercises Exercises: Hand activities Hand Activities Folding Paper: Right;10 reps;Seated Turning Pages: Right;10 reps;Seated Paper Clips on Rim of Cup: Right;10 reps;Seated Other Exercises Other Exercises: encouraged hand writing practice, paper folding, paper tearing, picking up small objects & placing into cup, finger <>palm translation   Shoulder Instructions       General Comments VSS on RA    Pertinent Vitals/ Pain       Pain Assessment: No/denies pain   Frequency  Min 2X/week        Progress Toward Goals  OT Goals(current goals can now be found in the care plan section)  Progress towards OT goals: Progressing toward goals  Acute Rehab OT Goals Patient Stated Goal: get home OT Goal Formulation: With patient Time For Goal Achievement: 04/19/21 Potential to Achieve Goals: Good ADL Goals Pt Will Perform Grooming: with modified independence Pt Will Perform Upper Body Bathing: with set-up Pt Will Perform Lower Body Bathing: with supervision;with set-up Pt Will Perform Upper Body Dressing: with supervision;sitting Pt Will Transfer to Toilet: bedside commode;squat pivot transfer;with supervision Pt Will Perform Toileting - Clothing Manipulation and hygiene: with set-up;with supervision Pt/caregiver will Perform Home Exercise Program: Right Upper extremity;With written HEP provided  Plan Discharge plan remains appropriate    Co-evaluation                 AM-PAC OT "6 Clicks" Daily Activity     Outcome Measure   Help from another person eating meals?: A Little Help from another person taking care of personal grooming?: A Little Help from another person toileting, which includes using toliet, bedpan, or urinal?: A Lot Help from another person bathing (including washing, rinsing, drying)?: A Lot Help from another person to put on and taking off regular upper body clothing?: A Little Help from  another person to put on and taking off regular lower body clothing?: A Lot 6 Click Score: 15    End of Session Equipment Utilized During Treatment: Gait belt;Rolling walker (2 wheels)  OT Visit Diagnosis: Unsteadiness on feet (R26.81);Other abnormalities of gait and mobility (R26.89);Muscle weakness (generalized) (M62.81);History of falling (Z91.81);Other symptoms and signs involving the nervous system (R29.898);Hemiplegia and hemiparesis;Dizziness and giddiness (R42) Hemiplegia - Right/Left: Right Hemiplegia - dominant/non-dominant: Dominant Hemiplegia - caused by: Cerebral infarction   Activity Tolerance Patient tolerated treatment well   Patient Left with call bell/phone within reach;with family/visitor present;in chair;with chair alarm set   Nurse Communication Mobility status (purwick out, encouraged trips to the bathroom)        Time: 1405-1430 OT Time Calculation (min): 25 min  Charges: OT General Charges $OT Visit: 1 Visit OT Treatments $Therapeutic Activity: 23-37 mins  Tomasz Steeves A Beckham Capistran 04/07/2021, 3:38 PM

## 2021-04-07 NOTE — Progress Notes (Signed)
PROGRESS NOTE    Christine Fritz  LNL:[id number] DOB: [birth year] DOA: 04/04/2021 PCP: Merri Brunette, MD   Chief Complaint  Patient presents with   Weakness  Brief Narrative/Hospital Course:  Christine Fritz, 60 y.o. female with PMH of  no significant past medical history who has not been to a doctor for more than 20 years presented to the ER with complaints of right upper and lower extremity weakness and numbness which has been ongoing since Saturday, April 03, 2021.  Evaluation raise concern for acute stroke  as Mri showed "1 cm acute ischemic nonhemorrhagic infarct involving the posterior limb of the left internal capsule.Remote lacunar infarct at the right basal ganglia/corona radiata.Absent flow void within the right vertebral artery within the neck, likely occluded". MR angio head negative for LVO, severe right A2 stenosis, question severe stenosis at the origin of the left SCA Seen by neurology completed a stroke work-up with LDL 259 , HbA1c 5.5, UDS negative, carotid Dopplers negative, echo with EF 60 to 65%, moderate pericardial effusion.  Neurology advised aspirin 81+ Plavix x3 weeks and then aspirin alone, Lipitor 80 mg, seen by PT OT - recommendedl CIR, awaiting for placement.   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 MR angio head negative for LVO, severe right A2 stenosis, question severe stenosis at the origin of the left SCA Seen by neurology completed a stroke work-up with LDL 259 , HbA1c 5.5, UDS negative, carotid Dopplers negative  stenosis on ICA, bilateral vertebral arteries with anterograde flow, echo with EF 60 to 65%, moderate pericardial effusion.  Neurology advised aspirin 81+ Plavix x3 weeks and then aspirin alone, Lipitor 80 mg, seen by PT OT - recommendedl CIR, awaiting for placement.  Moderate pericardial effusion, located anterior of the right venticle seen an echo that showed RV does not expand fully suggesting increased pericardial pressure but there is no evidence of tamponade. 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  Plan is to repeat echocardiogram prior to discharge to CIR.  Hypokalemia repleted.  Accelerated hypertension: No prior history.  Now well controlled on amlodipine continue the same.  Hypothyroidism new diagnosis with TSH 2.9 Free T4 0.3 continue on levothyroxine 100 MCG follow-up in 3 to 4 weeks to adjust her dose  Elevated creatinine: Creatinine: 1.5-1.7 suspecting CKD IIIb no previous baseline value Recent Labs  Lab 04/04/21 1630 04/04/21 1647 04/05/21 0457 04/06/21 0244 04/07/21 0209  BUN 10 11  --  13 19  CREATININE 1.59* 1.60* 1.56* 1.72* 1.73*    Class III Obesity:Patient's Body mass index is 42.4 kg/m. : Will benefit with PCP follow-up, weight loss  healthy lifestyle and outpatient sleep evaluation.  DVT prophylaxis:  Code Status:   Code Status: Full Code Family Communication: plan of care discussed with patient at bedside. Status is: Inpatient Remains inpatient appropriate because: For acute stroke work-up repeat echo and pending CIR placement Disposition: Currently is medically stable for discharge. Anticipated Disposition: Inpatient rehab   Objective: Vitals last 24 hrs: Vitals:   04/06/21 2058 04/07/21 0001 04/07/21 0321 04/07/21 0754  BP: (!) 153/97 122/77 114/89  120/77  Pulse: 78 76 78 76  Resp: Temp: (!) 97.5 F (36.4 C) 97.6 F (36.4 C) (!) 97.5 F (36.4 C)   TempSrc: Oral Oral Oral   SpO2: 97%  97% 97% 97%  Weight:      Height:       Weight change:   Intake/Output Summary (Last 24 hours) at 04/07/2021 4098 Last data filed at 04/07/2021 0321 Gross per 24 hour  Intake 120 ml  Output 725 ml  Net -605 ml   Net IO Since Admission: -83.67 mL [04/07/21 0822]   Physical Examination: General exam: Aa0x3, weak,older than stated age. HEENT:Oral mucosa moist, Ear/Nose WNL grossly,dentition normal. Respiratory system: B/l CLEAR BS, no use of accessory muscle, non tender. Cardiovascular system: S1 & S2 +,No JVD. Gastrointestinal system: Abdomen soft, NT,ND, BS+. Nervous System:Alert, awake, moving extremities WITH WEAKENSS ON rue/rle. Extremities: edema none, distal peripheral pulses palpable.  Skin: No rashes, no icterus. MSK: Normal muscle bulk, tone, power.  Medications reviewed:  Scheduled Meds:   stroke: mapping our early stages of recovery book   Does not apply Once   amLODipine  5 mg Oral Daily   aspirin EC  81 mg Oral Daily   atorvastatin  80 mg Oral Daily   clopidogrel  75 mg Oral Daily   enoxaparin (LOVENOX) injection  50 mg Subcutaneous Q24H   levothyroxine  100 mcg Oral Q0600   sodium chloride flush  3 mL Intravenous Once   Continuous Infusions: Diet Order             Diet Heart Room service appropriate? Yes; Fluid consistency: Thin  Diet effective now                          Weight change:   Wt Readings from Last 3 Encounters:  04/04/21 112 kg     Consultants:see note  Procedures:see note Antimicrobials: Anti-infectives (From admission, onward)    None      Culture/Microbiology No results found for: SDES, SPECREQUEST, CULT, REPTSTATUS  Other culture-see note  Unresulted Labs (From admission, onward)     Start     Ordered   04/12/21 0500  Creatinine, serum  (enoxaparin (LOVENOX)    CrCl >/= 30 ml/min)  Weekly,   R     Comments: while on enoxaparin therapy    04/05/21 0210   04/06/21 0500  Basic metabolic panel  Daily,   R       04/05/21 0913           Data Reviewed: I have personally reviewed following labs and imaging studies CBC: Recent Labs  Lab 04/04/21 1630 04/04/21 1647 04/05/21 0457 04/06/21 0244  WBC 7.6  --  9.0 8.0  NEUTROABS 5.2  --   --   --   HGB 14.0 14.6 14.1 13.1  HCT 43.4 43.0 42.7 40.7  MCV 92.1  --  91.4 91.7  PLT 252  --  236 228   Basic Metabolic Panel: Recent Labs  Lab 04/04/21 1630 04/04/21 1647 04/05/21 0457 04/06/21 0244 04/07/21 0209  NA 138 139  --  138 133*  K 4.0 4.0  --  4.0 3.1*  CL 99 100  --  102 101  CO2 28  --   --  28 25  GLUCOSE 97 93  --  104* 101*  BUN 10 11  --  13 19  CREATININE 1.59* 1.60* 1.56* 1.72* 1.73*  CALCIUM  9.6  --   --  9.0 8.8*   GFR: Estimated Creatinine Clearance: 42.9 mL/min (A) (by C-G formula based on SCr of 1.73 mg/dL (H)). Liver Function Tests: Recent Labs  Lab 04/04/21 1630  AST 27  ALT 14  ALKPHOS 57  BILITOT 0.8  PROT 7.7  ALBUMIN 4.4   No results for input(s): LIPASE, AMYLASE in the last 168 hours. No results for input(s): AMMONIA in the last 168 hours. Coagulation Profile: Recent Labs  Lab 04/04/21 1630  INR 1.0   Cardiac Enzymes: No results for input(s): CKTOTAL, CKMB, CKMBINDEX, TROPONINI in the last 168 hours. BNP (last 3 results) No results for input(s): PROBNP in the last 8760 hours. HbA1C: Recent Labs    04/05/21 0457  HGBA1C 5.5   CBG: Recent Labs  Lab 04/04/21 1633  GLUCAP 101*   Lipid Profile: Recent Labs    04/05/21 0457  CHOL 353*  HDL 49  LDLCALC 259*  TRIG 227*  CHOLHDL 7.2   Thyroid Function Tests: Recent Labs    04/06/21 0244  TSH 219.795*  FREET4 0.32*   Anemia Panel: No results for input(s): VITAMINB12, FOLATE, FERRITIN, TIBC, IRON, RETICCTPCT in the last 72 hours. Sepsis Labs: No results for input(s): PROCALCITON, LATICACIDVEN in the last 168 hours.  Recent Results (from the past 240 hour(s))  Resp Panel by RT-PCR (Flu A&B, Covid) Nasopharyngeal Swab      Status: None   Collection Time: 04/05/21  4:57 AM   Specimen: Nasopharyngeal Swab; Nasopharyngeal(NP) swabs in vial transport medium  Result Value Ref Range Status   SARS Coronavirus 2 by RT PCR NEGATIVE NEGATIVE Final    Comment: (NOTE) SARS-CoV-2 target nucleic acids are NOT DETECTED.  The SARS-CoV-2 RNA is generally detectable in upper respiratory specimens during the acute phase of infection. The lowest concentration of SARS-CoV-2 viral copies this assay can detect is 138 copies/mL. A negative result does not preclude SARS-Cov-2 infection and should not be used as the sole basis for treatment or other patient management decisions. A negative result may occur with  improper specimen collection/handling, submission of specimen other than nasopharyngeal swab, presence of viral mutation(s) within the areas targeted by this assay, and inadequate number of viral copies(<138 copies/mL). A negative result must be combined with clinical observations, patient history, and epidemiological information. The expected result is Negative.  Fact Sheet for Patients:  BloggerCourse.com  Fact Sheet for Healthcare Providers:  SeriousBroker.it  This test is no t yet approved or cleared by the [place] FDA and  has been authorized for detection and/or diagnosis of SARS-CoV-2 by FDA under an Emergency Use Authorization (EUA). This EUA will remain  in effect (meaning this test can be used) for the duration of the COVID-19 declaration under Section 564(b)(1) of the Act, 21 U.S.C.section 360bbb-3(b)(1), unless the authorization is terminated  or revoked sooner.       Influenza A by PCR NEGATIVE NEGATIVE Final   Influenza B by PCR NEGATIVE NEGATIVE Final    Comment: (NOTE) The Xpert Xpress SARS-CoV-2/FLU/RSV plus assay is intended as an aid in the diagnosis of influenza from Nasopharyngeal swab specimens and should not be used as a sole basis  for treatment. Nasal washings and aspirates are unacceptable for Xpert Xpress SARS-CoV-2/FLU/RSV testing.  Fact Sheet for Patients: BloggerCourse.com  Fact Sheet for Healthcare Providers: SeriousBroker.it  This test is not yet approved or cleared by the [place] FDA and has been authorized for detection and/or diagnosis of SARS-CoV-2 by FDA under an Emergency  Use Authorization (EUA). This EUA will remain in effect (meaning this test can be used) for the duration of the COVID-19 declaration under Section 564(b)(1) of the Act, 21 U.S.C. section 360bbb-3(b)(1), unless the authorization is terminated or revoked.  Performed at [facility], [address] [place], [place] [address]      Radiology Studies: DG CHEST PORT 1 VIEW  Result Date: 04/05/2021 CLINICAL DATA:  Pericardial effusion. EXAM: PORTABLE CHEST 1 VIEW COMPARISON:  None. FINDINGS: Cardiac and mediastinal contours are within normal limits. No focal pulmonary opacity. No pleural effusion or pneumothorax. No acute osseous abnormality. IMPRESSION: No acute cardiopulmonary process. No definite evidence of pericardial effusion. Electronically Signed   By: Wiliam Ke M.D.   On: 04/05/2021 14:57   ECHOCARDIOGRAM COMPLETE  Result Date: 04/05/2021    ECHOCARDIOGRAM REPORT   Patient Name:   Christine Fritz Date of Exam: 04/05/2021 Medical Rec #:  [id number]     Height:       64.0 in Accession #:    [id number]    Weight:       247.0 lb Date of Birth:  [birth year]      BSA:          2.140 m Patient Age:    60 years      BP:           155/109 mmHg Patient Gender: F             HR:           81 bpm. Exam Location:  Inpatient Procedure: 2D Echo, Cardiac Doppler and Color Doppler Indications:    Stroke  History:        Patient has no prior history of Echocardiogram examinations.                 Acute CVA.  Sonographer:    Vanetta Shawl Referring Phys: 41 Eduard Clos   Sonographer Comments: Technically difficult study due to poor echo windows. IMPRESSIONS  1. Left ventricular ejection fraction, by estimation, is 60 to 65%. The left ventricle has normal function. The left ventricle has no regional wall motion abnormalities. There is severe concentric left ventricular hypertrophy. Left ventricular diastolic  parameters are indeterminate.  2. Right ventricular systolic function is normal. The right ventricular size is normal. Moderately increased right ventricular wall thickness.  3. Moderate pericardial effusion, located anterior of the right venticle. The right ventricle does not expand fully suggesting increased pericardial pressure but there is no evidence of tamponade. 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  4. The mitral valve is normal in structure. No evidence of mitral valve regurgitation. No evidence of mitral stenosis.  5. The aortic valve is normal in structure. Aortic valve regurgitation is not visualized. No aortic stenosis is present.  6. The inferior vena cava is normal in size with greater than 50% respiratory variability, suggesting right atrial pressure of 3 mmHg. Conclusion(s)/Recommendation(s): Due to findings of Moderate pericardial effusion with no evidence of tamponande, a follow up echocardiogram is recommended in prior to discharge. FINDINGS  Left Ventricle: Left ventricular ejection fraction, by estimation, is 60 to 65%. The left ventricle has normal function. The left ventricle has no regional wall motion abnormalities. The left ventricular internal cavity size was normal in size. There is  severe concentric left ventricular hypertrophy. Left ventricular diastolic parameters are indeterminate. Right Ventricle: The right ventricular size is normal. Moderately increased right ventricular wall  thickness. Right ventricular systolic function is normal. Left Atrium: Left atrial size was normal in size. Right  Atrium: Right atrial size was normal in size. Pericardium: The right ventricle does not expand fully suggesting increased pericardial pressure but there is no evidence of tamponade. 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 A moderately sized pericardial effusion is present. The pericardial effusion is anterior to the right ventricle. Presence of epicardial fat layer. Mitral Valve: The mitral valve is normal in structure. No evidence of mitral valve regurgitation. No evidence of mitral valve stenosis. Tricuspid Valve: The tricuspid valve is normal in structure. Tricuspid valve regurgitation is not demonstrated. No evidence of tricuspid stenosis. Aortic Valve: The aortic valve is normal in structure. Aortic valve regurgitation is not visualized. No aortic stenosis is present. Aortic valve mean gradient measures 4.0 mmHg. Aortic valve peak gradient measures 6.7 mmHg. Aortic valve area, by VTI measures 2.53 cm. Pulmonic Valve: The pulmonic valve was normal in structure. Pulmonic valve regurgitation is not visualized. No evidence of pulmonic stenosis. Aorta: The aortic root is normal in size and structure. Venous: The inferior vena cava is normal in size with greater than 50% respiratory variability, suggesting right atrial pressure of 3 mmHg. IAS/Shunts: No atrial level shunt detected by color flow Doppler.  LEFT VENTRICLE PLAX 2D LVIDd:         3.50 cm   Diastology LVIDs:         2.50 cm   LV e' medial:    5.33 cm/s LV PW:         1.70 cm   LV E/e' medial:  11.7 LV IVS:        1.70 cm   LV e' lateral:   3.81 cm/s LVOT diam:     1.90 cm   LV E/e' lateral: 16.4 LV SV:         68 LV SV Index:   32 LVOT Area:     2.84 cm  IVC IVC diam: 1.60 cm LEFT ATRIUM             Index LA diam:        3.40 cm 1.59 cm/m LA Vol (A2C):   38.8 ml 18.13 ml/m LA Vol (A4C):   33.3 ml 15.56 ml/m LA Biplane Vol: 37.6 ml 17.57 ml/m  AORTIC VALVE                    PULMONIC VALVE AV Area  (Vmax):    3.03 cm     PV Vmax:       1.22 m/s AV Area (Vmean):   2.74 cm     PV Peak grad:  6.0 mmHg AV Area (VTI):     2.53 cm AV Vmax:           129.00 cm/s AV Vmean:          91.400 cm/s AV VTI:            0.268 m AV Peak Grad:      6.7 mmHg AV Mean Grad:      4.0 mmHg LVOT Vmax:         138.00 cm/s LVOT Vmean:        88.200 cm/s LVOT VTI:          0.239 m LVOT/AV VTI ratio: 0.89  AORTA Ao Root diam: 3.20 cm Ao Asc diam:  3.30 cm MITRAL VALVE MV Area (PHT): 4.83 cm  SHUNTS MV Decel Time: 157 msec    Systemic VTI:  0.24 m MV E velocity: 62.40 cm/s  Systemic Diam: 1.90 cm MV A velocity: 84.80 cm/s MV E/A ratio:  0.74 Kardie Tobb DO Electronically signed by Thomasene Ripple DO Signature Date/Time: 04/05/2021/12:48:59 PM    Final    VAS US CAROTID (at [facility] and [facility] only)  Result Date: 04/05/2021 Carotid Arterial Duplex Study Patient Name:  Christine Fritz  Date of Exam:   04/05/2021 Medical Rec #: [id number]      Accession #:    [id number] Date of Birth: 1960/07/05       Patient Gender: F Patient Age:   4 years Exam Location:  [facility] Procedure:      VAS US CAROTID Referring Phys: Midge Minium --------------------------------------------------------------------------------  Indications:       CVA. Comparison Study:  no prior Performing Technologist: [place] Ponder RVS  Examination Guidelines: A complete evaluation includes B-mode imaging, spectral Doppler, color Doppler, and power Doppler as needed of all accessible portions of each vessel. Bilateral testing is considered an integral part of a complete examination. Limited examinations for reoccurring indications may be performed as noted.  Right Carotid Findings: +----------+--------+--------+--------+------------------+--------+           PSV cm/sEDV cm/sStenosisPlaque DescriptionComments +----------+--------+--------+--------+------------------+--------+ CCA Prox  50      12              heterogenous                +----------+--------+--------+--------+------------------+--------+ CCA Distal65      14              heterogenous               +----------+--------+--------+--------+------------------+--------+ ICA Prox  51      18      1-39%   heterogenous               +----------+--------+--------+--------+------------------+--------+ ICA Distal87      26                                         +----------+--------+--------+--------+------------------+--------+ ECA       261     17                                         +----------+--------+--------+--------+------------------+--------+ +----------+--------+-------+--------+-------------------+           PSV cm/sEDV cmsDescribeArm Pressure (mmHG) +----------+--------+-------+--------+-------------------+ [id number]                                        +----------+--------+-------+--------+-------------------+ +---------+--------+--+--------+-+---------+ VertebralPSV cm/s37EDV cm/s9Antegrade +---------+--------+--+--------+-+---------+  Left Carotid Findings: +----------+--------+--------+--------+------------------+--------+           PSV cm/sEDV cm/sStenosisPlaque DescriptionComments +----------+--------+--------+--------+------------------+--------+ CCA Prox  87      13              heterogenous               +----------+--------+--------+--------+------------------+--------+ CCA Distal67      12              heterogenous               +----------+--------+--------+--------+------------------+--------+ ICA Prox  44  14      1-39%   heterogenous               +----------+--------+--------+--------+------------------+--------+ ICA Distal87      26                                         +----------+--------+--------+--------+------------------+--------+ ECA       81      6                                          +----------+--------+--------+--------+------------------+--------+  +----------+--------+--------+--------+-------------------+           PSV cm/sEDV cm/sDescribeArm Pressure (mmHG) +----------+--------+--------+--------+-------------------+ [id number]                                         +----------+--------+--------+--------+-------------------+ +---------+--------+--+--------+--+---------+ VertebralPSV cm/s39EDV cm/s13Antegrade +---------+--------+--+--------+--+---------+   Summary: Right Carotid: Velocities in the right ICA are consistent with a 1-39% stenosis. Left Carotid: Velocities in the left ICA are consistent with a 1-39% stenosis. Vertebrals: Bilateral vertebral arteries demonstrate antegrade flow. *See table(s) above for measurements and observations.     Preliminary      LOS: 2 days   Lanae Boast, MD [facility]  04/07/2021, 8:22 AM

## 2021-04-08 ENCOUNTER — Encounter (HOSPITAL_COMMUNITY): Payer: Self-pay | Admitting: Physical Medicine and Rehabilitation

## 2021-04-08 ENCOUNTER — Inpatient Hospital Stay (HOSPITAL_COMMUNITY): Payer: Federal, State, Local not specified - PPO

## 2021-04-08 ENCOUNTER — Other Ambulatory Visit: Payer: Self-pay

## 2021-04-08 ENCOUNTER — Inpatient Hospital Stay (HOSPITAL_COMMUNITY)
Admission: RE | Admit: 2021-04-08 | Discharge: 2021-04-17 | DRG: 057 | Disposition: A | Discharge status: Home Health | Payer: Federal, State, Local not specified - PPO | Source: Intra-hospital | Attending: Physical Medicine and Rehabilitation | Admitting: Physical Medicine and Rehabilitation

## 2021-04-08 DIAGNOSIS — N1831 Chronic kidney disease, stage 3a: Secondary | ICD-10-CM

## 2021-04-08 DIAGNOSIS — I639 Cerebral infarction, unspecified: Secondary | ICD-10-CM

## 2021-04-08 DIAGNOSIS — Z8249 Family history of ischemic heart disease and other diseases of the circulatory system: Secondary | ICD-10-CM | POA: Diagnosis not present

## 2021-04-08 DIAGNOSIS — I3139 Other pericardial effusion (noninflammatory): Secondary | ICD-10-CM

## 2021-04-08 DIAGNOSIS — K5901 Slow transit constipation: Secondary | ICD-10-CM

## 2021-04-08 DIAGNOSIS — I679 Cerebrovascular disease, unspecified: Secondary | ICD-10-CM | POA: Diagnosis not present

## 2021-04-08 DIAGNOSIS — Z23 Encounter for immunization: Secondary | ICD-10-CM | POA: Diagnosis not present

## 2021-04-08 DIAGNOSIS — G2581 Restless legs syndrome: Secondary | ICD-10-CM | POA: Diagnosis not present

## 2021-04-08 DIAGNOSIS — I10 Essential (primary) hypertension: Secondary | ICD-10-CM

## 2021-04-08 DIAGNOSIS — I1310 Hypertensive heart and chronic kidney disease without heart failure, with stage 1 through stage 4 chronic kidney disease, or unspecified chronic kidney disease: Secondary | ICD-10-CM | POA: Diagnosis present

## 2021-04-08 DIAGNOSIS — Z6841 Body Mass Index (BMI) 40.0 and over, adult: Secondary | ICD-10-CM | POA: Diagnosis not present

## 2021-04-08 DIAGNOSIS — I69351 Hemiplegia and hemiparesis following cerebral infarction affecting right dominant side: Principal | ICD-10-CM

## 2021-04-08 DIAGNOSIS — E039 Hypothyroidism, unspecified: Secondary | ICD-10-CM | POA: Diagnosis not present

## 2021-04-08 DIAGNOSIS — N179 Acute kidney failure, unspecified: Secondary | ICD-10-CM | POA: Diagnosis not present

## 2021-04-08 LAB — BASIC METABOLIC PANEL
Anion gap: 9 (ref 5–15)
BUN: 21 mg/dL — ABNORMAL HIGH (ref 6–20)
CO2: 26 mmol/L (ref 22–32)
Calcium: 9 mg/dL (ref 8.9–10.3)
Chloride: 100 mmol/L (ref 98–111)
Creatinine, Ser: 1.77 mg/dL — ABNORMAL HIGH (ref 0.44–1.00)
GFR, Estimated: 33 mL/min — ABNORMAL LOW (ref >60)
Glucose, Bld: 95 mg/dL (ref 70–99)
Potassium: 3.9 mmol/L (ref 3.5–5.1)
Sodium: 135 mmol/L (ref 135–145)

## 2021-04-08 LAB — ECHOCARDIOGRAM LIMITED
Calc EF: 57.1 %
Height: 64 in
S' Lateral: 2.6 cm
Single Plane A2C EF: 59 %
Single Plane A4C EF: 55.2 %
Weight: 3952 oz

## 2021-04-08 MED ORDER — ASPIRIN 81 MG PO TBEC
81.0000 mg | DELAYED_RELEASE_TABLET | Freq: Every day | ORAL | 11 refills | Status: AC
Start: 2021-04-09 — End: ?

## 2021-04-08 MED ORDER — ATORVASTATIN CALCIUM 80 MG PO TABS: 80.0000 mg | ORAL_TABLET | Freq: Every day | ORAL | Status: DC

## 2021-04-08 MED ORDER — CLOPIDOGREL BISULFATE 75 MG PO TABS: 75.0000 mg | ORAL_TABLET | Freq: Every day | ORAL | Status: DC

## 2021-04-08 MED ORDER — BLOOD PRESSURE CONTROL BOOK
Freq: Once | Status: AC
  Filled 2021-04-08: qty 1

## 2021-04-08 MED ORDER — ENOXAPARIN SODIUM 60 MG/0.6ML IJ SOSY
50.0000 mg | PREFILLED_SYRINGE | INTRAMUSCULAR | Status: DC
  Filled 2021-04-08: qty 0.6

## 2021-04-08 MED ORDER — LEVOTHYROXINE SODIUM 100 MCG PO TABS
100.0000 ug | ORAL_TABLET | Freq: Every day | ORAL | Status: DC
  Administered 2021-04-09 – 2021-04-17 (×9): 100 ug via ORAL
  Filled 2021-04-08 (×9): qty 1

## 2021-04-08 MED ORDER — ACETAMINOPHEN 650 MG RE SUPP: 650.0000 mg | RECTAL | Status: DC | PRN

## 2021-04-08 MED ORDER — EXERCISE FOR HEART AND HEALTH BOOK
Freq: Once | Status: AC
  Filled 2021-04-08: qty 1

## 2021-04-08 MED ORDER — LEVOTHYROXINE SODIUM 100 MCG PO TABS: 100.0000 ug | ORAL_TABLET | Freq: Every day | ORAL | Status: DC

## 2021-04-08 MED ORDER — ACETAMINOPHEN 325 MG PO TABS
650.0000 mg | ORAL_TABLET | ORAL | Status: DC | PRN
  Administered 2021-04-08 – 2021-04-17 (×11): 650 mg via ORAL
  Filled 2021-04-08 (×11): qty 2

## 2021-04-08 MED ORDER — CLOPIDOGREL BISULFATE 75 MG PO TABS
75.0000 mg | ORAL_TABLET | Freq: Every day | ORAL | Status: DC
  Administered 2021-04-09 – 2021-04-17 (×9): 75 mg via ORAL
  Filled 2021-04-08 (×9): qty 1

## 2021-04-08 MED ORDER — ENOXAPARIN SODIUM 60 MG/0.6ML IJ SOSY: 50.0000 mg | PREFILLED_SYRINGE | INTRAMUSCULAR | Status: DC

## 2021-04-08 MED ORDER — ACETAMINOPHEN 160 MG/5ML PO SOLN
650.0000 mg | ORAL | Status: DC | PRN
  Administered 2021-04-12 – 2021-04-13 (×2): 650 mg
  Filled 2021-04-08 (×2): qty 20.3

## 2021-04-08 MED ORDER — ASPIRIN EC 81 MG PO TBEC
81.0000 mg | DELAYED_RELEASE_TABLET | Freq: Every day | ORAL | Status: DC
  Administered 2021-04-09 – 2021-04-17 (×9): 81 mg via ORAL
  Filled 2021-04-08 (×9): qty 1

## 2021-04-08 MED ORDER — ATORVASTATIN CALCIUM 80 MG PO TABS
80.0000 mg | ORAL_TABLET | Freq: Every day | ORAL | Status: DC
  Administered 2021-04-09 – 2021-04-17 (×9): 80 mg via ORAL
  Filled 2021-04-08 (×10): qty 1

## 2021-04-08 MED ORDER — AMLODIPINE BESYLATE 5 MG PO TABS
5.0000 mg | ORAL_TABLET | Freq: Every day | ORAL | Status: DC
  Administered 2021-04-09 – 2021-04-12 (×4): 5 mg via ORAL
  Filled 2021-04-08 (×4): qty 1

## 2021-04-08 MED ORDER — AMLODIPINE BESYLATE 5 MG PO TABS: 5.0000 mg | ORAL_TABLET | Freq: Every day | ORAL | Status: DC

## 2021-04-08 NOTE — Progress Notes (Signed)
Physical Therapy Treatment Patient Details Name: Christine Fritz MRN: [id number] DOB: Oct 07, 1960 Today's Date: 04/08/2021   History of Present Illness Pt is a 60 y/o female admitted secondary to R extremity deficits. 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 No pertinent PMH.    PT Comments    Patient progressing ambulation distance and ease of moving with much less cueing required.  Patient with more controlled emotions as well.  Educated on role of fatigue with dealing with issues.  Patient also participated in standing and seated coordination tasks.  Continue to recommend acute inpatient rehab at d/c.    Recommendations for follow up therapy are one component of a multi-disciplinary discharge planning process, led by the attending physician.  Recommendations may be updated based on patient status, additional functional criteria and insurance authorization.  Follow Up Recommendations  Acute inpatient rehab (3hours/day)     Assistance Recommended at Discharge Frequent or constant Supervision/Assistance  Equipment Recommendations  Rolling walker (2 wheels)    Recommendations for Other Services       Precautions / Restrictions Precautions Precautions: Fall Precaution Comments: R weakness, incoordination     Mobility  Bed Mobility               General bed mobility comments: up in recliner    Transfers Overall transfer level: Needs assistance Equipment used: Rolling walker (2 wheels) Transfers: Sit to/from Stand Sit to Stand: Min assist           General transfer comment: in recliner, stood with A for balance    Ambulation/Gait Ambulation/Gait assistance: Min assist Gait Distance (Feet): 75 Feet (&12) Assistive device: Rolling walker (2 wheels) Gait Pattern/deviations: Step-through pattern;Step-to pattern;Decreased stride length;Decreased dorsiflexion - right;Decreased step length - right;Wide base of support       General Gait Details: able to walk  without cues except some for turns and in small space in bathroom with side step with RW   Stairs             Wheelchair Mobility    Modified Rankin (Stroke Patients Only) Modified Rankin (Stroke Patients Only) Pre-Morbid Rankin Score: No symptoms Modified Rankin: Moderately severe disability     Balance Overall balance assessment: Needs assistance Sitting-balance support: Feet supported Sitting balance-Leahy Scale: Good Sitting balance - Comments: sitting reaching to wipe after toileting on commode   Standing balance support: During functional activity Standing balance-Leahy Scale: Poor Standing balance comment: UE support for balance                            Cognition Arousal/Alertness: Awake/alert Behavior During Therapy: WFL for tasks assessed/performed Overall Cognitive Status: Impaired/Different from baseline                     Current Attention Level: Selective     Safety/Judgement: Decreased awareness of safety   Problem Solving: Slow processing          Exercises Other Exercises Other Exercises: coordination activity at sink: stepping out to side then in with L then R LE; then stepping out R then out L then in R then in L Other Exercises: Educated on seated coordination activities marching and toe taps    General Comments        Pertinent Vitals/Pain Pain Assessment: No/denies pain    Home Living  Prior Function            PT Goals (current goals can now be found in the care plan section) Progress towards PT goals: Progressing toward goals    Frequency    Min 4X/week      PT Plan Current plan remains appropriate    Co-evaluation              AM-PAC PT "6 Clicks" Mobility   Outcome Measure  Help needed turning from your back to your side while in a flat bed without using bedrails?: A Little Help needed moving from lying on your back to sitting on the side of a  flat bed without using bedrails?: A Little Help needed moving to and from a bed to a chair (including a wheelchair)?: A Little Help needed standing up from a chair using your arms (e.g., wheelchair or bedside chair)?: A Little Help needed to walk in hospital room?: A Little Help needed climbing 3-5 steps with a railing? : Total 6 Click Score: 16    End of Session Equipment Utilized During Treatment: Gait belt Activity Tolerance: Patient tolerated treatment well Patient left: in chair;with call bell/phone within reach   PT Visit Diagnosis: Other abnormalities of gait and mobility (R26.89);Hemiplegia and hemiparesis;History of falling (Z91.81) Hemiplegia - Right/Left: Right Hemiplegia - dominant/non-dominant: Dominant Hemiplegia - caused by: Cerebral infarction     Time: 3343-5686 PT Time Calculation (min) (ACUTE ONLY): 31 min  Charges:  $Gait Training: 8-22 mins $Therapeutic Activity: 8-22 mins                     Sheran Lawless, PT Acute Rehabilitation Services Pager:[phone] Office:[phone] 04/08/2021    [name] 04/08/2021, 1:33 PM

## 2021-04-08 NOTE — Plan of Care (Incomplete)
Pt is alert oriented x 4, no distress noted. Pt has right sided weakness. Pt c/o pain to right arm, prn pain medication given with effective results. Pt had purewick in place but it leaked. Pt is able to ambulate with assistance, pt asked to call out when need to use restroom.   Problem: Education: Goal: Knowledge of General Education information will improve Description: Including pain rating scale, medication(s)/side effects and non-pharmacologic comfort measures Outcome: Progressing   Problem: Health Behavior/Discharge Planning: Goal: Ability to manage health-related needs will improve Outcome: Progressing   Problem: Clinical Measurements: Goal: Ability to maintain clinical measurements within normal limits will improve Outcome: Progressing Goal: Will remain free from infection Outcome: Progressing Goal: Diagnostic test results will improve Outcome: Progressing Goal: Respiratory complications will improve Outcome: Progressing Goal: Cardiovascular complication will be avoided Outcome: Progressing   Problem: Activity: Goal: Risk for activity intolerance will decrease Outcome: Progressing   Problem: Nutrition: Goal: Adequate nutrition will be maintained Outcome: Progressing   Problem: Coping: Goal: Level of anxiety will decrease Outcome: Progressing   Problem: Elimination: Goal: Will not experience complications related to bowel motility Outcome: Progressing Goal: Will not experience complications related to urinary retention Outcome: Progressing   Problem: Pain Managment: Goal: General experience of comfort will improve Outcome: Progressing   Problem: Safety: Goal: Ability to remain free from injury will improve Outcome: Progressing   Problem: Skin Integrity: Goal: Risk for impaired skin integrity will decrease Outcome: Progressing   Problem: Education: Goal: Knowledge of disease or condition will improve Outcome: Progressing Goal: Knowledge of secondary  prevention will improve (SELECT ALL) Outcome: Progressing Goal: Knowledge of patient specific risk factors will improve (INDIVIDUALIZE FOR PATIENT) Outcome: Progressing   Problem: Coping: Goal: Will verbalize positive feelings about self Outcome: Progressing Goal: Will identify appropriate support needs Outcome: Progressing   Problem: Health Behavior/Discharge Planning: Goal: Ability to manage health-related needs will improve Outcome: Progressing   Problem: Self-Care: Goal: Ability to participate in self-care as condition permits will improve Outcome: Progressing Goal: Verbalization of feelings and concerns over difficulty with self-care will improve Outcome: Progressing Goal: Ability to communicate needs accurately will improve Outcome: Progressing   Problem: Nutrition: Goal: Risk of aspiration will decrease Outcome: Progressing   Problem: Ischemic Stroke/TIA Tissue Perfusion: Goal: Complications of ischemic stroke/TIA will be minimized Outcome: Progressing

## 2021-04-08 NOTE — TOC Transition Note (Signed)
Transition of Care [facility]) - CM/SW Discharge Note   Patient Details  Name: Christine Fritz MRN: [id number] Date of Birth: Aug 05, 1960  Transition of Care [facility]) CM/SW Contact:  Kermit Balo, RN Phone Number: 04/08/2021, 10:29 AM   Clinical Narrative:    Patient is discharging to CIR today. CM signing off.    Final next level of care: IP Rehab Facility Barriers to Discharge: No Barriers Identified   Patient Goals and CMS Choice        Discharge Placement                       Discharge Plan and Services                                     Social Determinants of Health (SDOH) Interventions     Readmission Risk Interventions No flowsheet data found.

## 2021-04-08 NOTE — H&P (Signed)
Physical Medicine and Rehabilitation Admission H&P    Chief Complaint  Patient presents with   Weakness  : HPI: Christine Fritz is a 60 year old right-handed female with unremarkable past medical history except obesity with BMI 42.40 who has not been to a doctor for more than 20 years.  Per chart review patient lives with spouse.  Independent prior to admission.  Two-level home bed and bath main level with 3 steps to entry.  She does have good family support.  Presented 04/04/2021 with with transient right side weakness.  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  Remote lacunar infarct of the right basal ganglia/corona radiata.  MRI cervical spine left paracentral disc protrusion at C5-6 contacting and mildly flattening the left hemicord resulting in mild spinal stenosis.  MRA negative for large vessel occlusion.  Noted severe right A2 stenosis.  Admission chemistries unremarkable except TSH 219.795 creatinine 1.59, urine drug screen negative, LDL 259.  Echocardiogram ejection fraction of 60 to 65%, moderate pericardial effusion no wall motion abnormalities.  Placed on aspirin 81 mg daily and Plavix 75 mg day x3 weeks then aspirin alone for CVA prophylaxis.  Maintain on Lovenox for DVT prophylaxis.  She was placed on Synthroid for findings of elevated TSH.  Therapy evaluations completed due to patient's right side weakness decreased functional mobility was admitted for a comprehensive rehab program.  Review of Systems  Constitutional:  Negative for chills and fever.  HENT:  Negative for hearing loss.   Eyes:  Negative for blurred vision and double vision.  Respiratory:  Negative for cough and shortness of breath.   Cardiovascular:  Negative for chest pain, palpitations and leg swelling.  Gastrointestinal:  Positive for constipation. Negative for heartburn, nausea and vomiting.  Genitourinary:  Negative for dysuria, flank  pain and hematuria.  Musculoskeletal:  Positive for myalgias.  Skin:  Negative for rash.  Neurological:  Positive for dizziness and weakness.       Occasional headaches  All other systems reviewed and are negative. History reviewed. No pertinent past medical history. History reviewed. No pertinent surgical history. Family History  Problem Relation Age of Onset   Hypertension Father    Stroke Neg Hx    Social History:  reports that she has never smoked. She has never used smokeless tobacco. She reports that she does not currently use alcohol. She reports that she does not currently use drugs. Allergies: No Known Allergies Medications Prior to Admission  Medication Sig Dispense Refill   Chlorphen-Pseudoephed-APAP (TYLENOL ALLERGY SINUS PO) Take 2 tablets by mouth daily.     magnesium oxide (MAG-OX) 400 MG tablet Take 400 mg by mouth daily.      Drug Regimen Review Drug regimen was reviewed and remains appropriate with no significant issues identified  Home: Home Living Family/patient expects to be discharged to:: Private residence Living Arrangements: Spouse/significant other, Other relatives Available Help at Discharge:  (spouse works but can have other family assist; has 5 adult children) Type of Home: House Home Access: Stairs to enter [employer] of Steps: 3 Entrance Stairs-Rails: Right, Left Home Layout: Able to live on main level with bedroom/bathroom, Two level Bathroom Shower/Tub: Tub/shower unit, Engineer, building services: Standard Bathroom Accessibility: Yes Home Equipment: None  Lives With: Spouse   Functional History: Prior Function Prior Level of Function : Independent/Modified Independent ADLs Comments: enjoys baking; spending time with grand childresn  Functional Status:  Mobility: Bed Mobility Overal  bed mobility: Needs Assistance Bed Mobility: Supine to Sit Supine to sit: Min assist Sit to supine: Mod assist, +2 for safety/equipment General  bed mobility comments: up in recliner Transfers Overall transfer level: Needs assistance Equipment used: Rolling walker (2 wheels) Transfers: Sit to/from Stand, Bed to chair/wheelchair/BSC Sit to Stand: Min assist Bed to/from chair/wheelchair/BSC transfer type:: Step pivot Step pivot transfers: Mod assist General transfer comment: up to stand from recliner and BSC increased time and cues for positioning; [facility] and [facility] with RW, increased time mod cues and assist for balance, walker management Ambulation/Gait Ambulation/Gait assistance: Mod assist, Min assist Gait Distance (Feet): 45 Feet Assistive device: Rolling walker (2 wheels) Gait Pattern/deviations: Step-to pattern, Decreased dorsiflexion - right, Decreased weight shift to left, Shuffle, Decreased step length - right, Trunk flexed General Gait Details: Cues for sequencing with walker and step to pattern with increased time and at times clearance for R foot, help for walker management at times and balance and especially with turns Gait velocity: decr    ADL: ADL Overall ADL's : Needs assistance/impaired Eating/Feeding: Supervision/ safety, Set up Eating/Feeding Details (indicate cue type and reason): unable to feed self with dominant RUE Grooming: Minimal assistance, Sitting Grooming Details (indicate cue type and reason): min A for verbal cues to use RUE for fucntional tasks - educated on compensatory tehcniques to use RUE as a functional assist Upper Body Bathing: Minimal assistance Lower Body Bathing: Moderate assistance, Sit to/from stand Upper Body Dressing : Moderate assistance Lower Body Dressing: Moderate assistance, Sit to/from stand Toilet Transfer: Minimal assistance, BSC/3in1, Rolling walker (2 wheels) Toilet Transfer Details (indicate cue type and reason): min A to rise and decend; verbal cues for safety and positioning Toileting- Clothing Manipulation and Hygiene: Maximal assistance Functional mobility during  ADLs: Minimal assistance General ADL Comments: Spent increased time educating pt on importance of repetition of funtional tasks with RUE i.e. eating sandwich, drinking from cup (two hands if needed), brushing teeth, typing on ipad, etc.  Cognition: Cognition Overall Cognitive Status: Impaired/Different from baseline Arousal/Alertness: Awake/alert Orientation Level: Oriented X4 Cognition Arousal/Alertness: Awake/alert Behavior During Therapy: WFL for tasks assessed/performed (easily tearful) Overall Cognitive Status: Impaired/Different from baseline Area of Impairment: Safety/judgement Current Attention Level: Sustained Safety/Judgement: Decreased awareness of safety Awareness: Emergent Problem Solving: Slow processing, Requires verbal cues, Requires tactile cues General Comments: Slow processing, required verbal cues for safety wtih OOB tasks. Pt a little emotional during session due to deficits  Physical Exam: Blood pressure (!) 141/100, pulse 75, temperature 98.6 F (37 C), temperature source Axillary, resp. rate 16, height 5\' 4"  (1.626 m), weight 112 kg, SpO2 97 %. Physical Exam Constitutional:      Appearance: She is obese.  HENT:     Head: Normocephalic and atraumatic.     Right Ear: External ear normal.     Left Ear: External ear normal.     Nose: Nose normal.     Mouth/Throat:     Pharynx: Oropharynx is clear.  Eyes:     Conjunctiva/sclera: Conjunctivae normal.     Pupils: Pupils are equal, round, and reactive to light.  Cardiovascular:     Rate and Rhythm: Normal rate and regular rhythm.     Heart sounds: No murmur heard.   No gallop.  Pulmonary:     Effort: Pulmonary effort is normal. No respiratory distress.     Breath sounds: No wheezing or rales.  Abdominal:     General: Bowel sounds are normal. There is no distension.  Palpations: Abdomen is soft.     Tenderness: There is no abdominal tenderness.  Musculoskeletal:        General: No tenderness or  deformity.     Cervical back: Normal range of motion and neck supple.  Skin:    General: Skin is warm and dry.  Neurological:     Comments: Patient is awake alert makes eye contact with examiner.  Cooperative with exam.  Follows simple commands.  Provides name and age.  Fair awareness and insight. Functional memory.  Speech sl dysarthric. No focal CN abnormalities although gaze sl dysconjugate at times (denies diplopia or blurred vision though). RUE 4+/5. RLE 4+/5. LUE and LLE 5/5. Senses light touch and pain in all 4's. DTR's 1+, toes not-reactive. Decreased FMC and dysmetria of RUE and RLE present.   Psychiatric:        Mood and Affect: Mood normal.        Behavior: Behavior normal.    Results for orders placed or performed during the hospital encounter of 04/04/21 (from the past 48 hour(s))  Basic metabolic panel     Status: Abnormal   Collection Time: 04/07/21  2:09 AM  Result Value Ref Range   Sodium 133 (L) 135 - 145 mmol/L   Potassium 3.1 (L) 3.5 - 5.1 mmol/L    Comment: NO VISIBLE HEMOLYSIS   Chloride 101 98 - 111 mmol/L   CO2 25 22 - 32 mmol/L   Glucose, Bld 101 (H) 70 - 99 mg/dL    Comment: Glucose reference range applies only to samples taken after fasting for at least 8 hours.   BUN 19 6 - 20 mg/dL   Creatinine, Ser 1.73 (H) 0.44 - 1.00 mg/dL   Calcium 8.8 (L) 8.9 - 10.3 mg/dL   GFR, Estimated 34 (L) >60 mL/min    Comment: (NOTE) Calculated using the CKD-EPI Creatinine Equation (2021)    Anion gap 7 5 - 15    Comment: Performed at [facility] [address], [place], [place] [address]  Magnesium     Status: None   Collection Time: 04/07/21  1:55 PM  Result Value Ref Range   Magnesium 2.3 1.7 - 2.4 mg/dL    Comment: Performed at [facility], [place] [address], [place], [place] [id number]  Basic metabolic panel     Status: Abnormal   Collection Time: 04/08/21  2:23 AM  Result Value Ref Range   Sodium 135 135 - 145 mmol/L   Potassium 3.9 3.5 - 5.1 mmol/L    Chloride 100 98 - 111 mmol/L   CO2 26 22 - 32 mmol/L   Glucose, Bld 95 70 - 99 mg/dL    Comment: Glucose reference range applies only to samples taken after fasting for at least 8 hours.   BUN 21 (H) 6 - 20 mg/dL   Creatinine, Ser 1.77 (H) 0.44 - 1.00 mg/dL   Calcium 9.0 8.9 - 10.3 mg/dL   GFR, Estimated 33 (L) >60 mL/min    Comment: (NOTE) Calculated using the CKD-EPI Creatinine Equation (2021)    Anion gap 9 5 - 15    Comment: Performed at [facility] [address] [place], [place] [address]   No results found.     Medical Problem List and Plan: 1.  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  Antithrombotics: -DVT/anticoagulation:  Pharmaceutical: Lovenox  -antiplatelet therapy: Aspirin 81 mg daily and Plavix 75 mg day x3 weeks and aspirin alone 3. Pain Management: Tylenol as needed 4. Mood: Provide emotional support  -antipsychotic agents: N/A 5. Neuropsych: This patient is capable of making decisions on her own behalf. 6. Skin/Wound Care: Routine skin checks 7. Fluids/Electrolytes/Nutrition: Routine in and outs with follow-up chemistries 8.  Hypertension.  Norvasc 5 mg daily.  Monitor with increased mobility. Adjust regimen as needed. BP high today 9.  New findings hypothyroidism.  Continue Synthroid.  Follow-up thyroid panel 3 to 4 weeks 10.  Admission elevated creatinine 1.59.  Suspect CKD stage III.  Follow-up chemistries on admit  -encourage adquate fluids 11.  Moderate pericardial effusion identified on echocardiogram.  Follow-up echocardiogram 12.  Morbid obesity.  BMI 42.40.  Dietary follow-up 13. Slow transit constipation:  -add scheduled miralax and senna-s  -sorbitol prn   Cathlyn Parsons, PA-C 04/08/2021

## 2021-04-08 NOTE — Discharge Summary (Signed)
Physician Discharge Summary  Latoi Giraldo Adamski ZWC:[id number] DOB: 1961/01/25 DOA: 04/04/2021  PCP: Merri Brunette, MD  Admit date: 04/04/2021 Discharge date: 04/08/2021  Admitted From: home Disposition:   CIR  Recommendations for Outpatient Follow-up:  Follow up with PCP in 1-2 weeks Please obtain BMP/CBC in one week Echo repeat in 4 wk for pericardial effusion Please follow up on the following pending results:  Home Health:NO  Equipment/Devices: NONE  Discharge Condition: Stable Code Status:   Code Status: Full Code Diet recommendation:  Diet Order             Diet Heart Room service appropriate? Yes; Fluid consistency: Thin  Diet effective now                    Brief/Interim Summary: Lucillie Garfinkel, 60 y.o. female with PMH of  no significant past medical history who has not been to a doctor for more than 20 years presented to the ER with complaints of right upper and lower extremity weakness and numbness which has been ongoing since Saturday, April 03, 2021.  Evaluation raise concern for acute stroke  as Mri showed "1 cm acute ischemic nonhemorrhagic infarct involving the posterior limb of the left internal capsule.Remote lacunar infarct at the right basal ganglia/corona radiata.Absent flow void within the right vertebral artery within the neck, likely occluded". MR angio head negative for LVO, severe right A2 stenosis, question severe stenosis at the origin of the left SCA Seen by neurology completed a stroke work-up with LDL 259 , HbA1c 5.5, UDS negative, carotid Dopplers negative, echo with EF 60 to 65%, moderate pericardial effusion.  Neurology advised aspirin 81+ Plavix x3 weeks and then aspirin alone, Lipitor 80 mg, seen by PT OT - recommendedl CIR, awaiting for placement.  Insurance has approved CIR now waiting for bed-and will be discharged once echo is resulted and pericardial effusion is improved.   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 MR angio head negative for LVO, severe right A2 stenosis, question severe stenosis at the origin of the left SCA Seen by neurology completed a stroke work-up with LDL 259 , HbA1c 5.5, UDS negative, carotid Dopplers negative stenosis on ICA, bilateral vertebral arteries with anterograde flow, echo with EF 60 to 65%, moderate pericardial effusion.  Neurology advised aspirin 81+ Plavix x3 weeks and then aspirin alone, Lipitor 80 mg, seen by PT OT - recommendedl CIR.  Moderate pericardial effusion, located anterior of the right venticle seen an echo that showed RV does not expand fully suggesting increased pericardial pressure but there is no evidence of tamponade. 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  Repeat echo pending done 11/17 and discussed with Dr Eden Emms he felt "Effusion looks small to me No change from 11/14 IVC not dilated and hemodynamics are fine Ok to go to inpatient rehab would check echo again in 4 weeks.  Hypokalemia resolved.  Accelerated hypertension: No prior history of hypertension blood pressure fairly stable continue with amodipine.  Hypothyroidism new diagnosis with TSH 2.9 Free T4 0.3 continue on levothyroxine 100 MCG follow-up TSH in  4 weeks to adjust her dose  Elevated creatinine: Creatinine: 1.5-1.7 suspecting CKD IIIb no  previous baseline value.  Remains a stable Recent Labs  Lab 04/04/21 1630 04/04/21 1647 04/05/21 0457 04/06/21 0244 04/07/21 0209 04/08/21 0223  BUN 10 11  --  13 19 21*  CREATININE 1.59* 1.60* 1.56* 1.72* 1.73* 1.77*   Class III Obesity:Patient's Body mass index is 42.4 kg/m. : Will  benefit with PCP follow-up, weight loss  healthy lifestyle and outpatient sleep evaluation.  Consults: Cardio neuro  Subjective: Aa0x3, feels well, no chest pian or shortness of breath  Discharge Exam: Vitals:   04/08/21 0737 04/08/21 1140  BP: (!) 157/97 (!) 145/106  Pulse: 75 72  Resp: 16 20  Temp: 97.7 F (36.5 C) 98.1 F (36.7 C)  SpO2: 100% 99%   General: Pt is alert, awake, not in acute distress Cardiovascular: RRR, S1/S2 +, no rubs, no gallops Respiratory: CTA bilaterally, no wheezing, no rhonchi Abdominal: Soft, NT, ND, bowel sounds + Extremities: no edema, no cyanosis  Discharge Instructions  Discharge Instructions     Ambulatory referral to Neurology   Complete by: As directed    Follow up with stroke clinic NP (Jessica Vanschaick or Darrol Angel, if both not available, consider Manson Allan, or Ahern) at [facility] in about 4 weeks. Thanks.   Discharge instructions   Complete by: As directed    CBC BMP in 1 week.  Follow-up with neurology in 2 to 4 weeks.  Follow-up with PCP in 1 week.  Check TSH in 4 weeks to adjust Synthroid dose  Please call call MD or return to ER for similar or worsening recurring problem that brought you to hospital or if any fever,nausea/vomiting,abdominal pain, uncontrolled pain, chest pain,  shortness of breath or any other alarming symptoms.  Please follow-up your doctor as instructed in a week time and call the office for appointment.  Please avoid alcohol, smoking, or any other illicit substance and maintain healthy habits including taking your regular medications as prescribed.  You were cared for by a hospitalist during your hospital stay. If you have any questions about your discharge medications or the care you received while you were in the hospital after you are discharged, you can call the unit and ask to speak with the hospitalist on call if the hospitalist that took care of you is not available.  Once you are discharged,  your primary care physician will handle any further medical issues. Please note that NO REFILLS for any discharge medications will be authorized once you are discharged, as it is imperative that you return to your primary care physician (or establish a relationship with a primary care physician if you do not have one) for your aftercare needs so that they can reassess your need for medications and monitor your lab values   Increase activity slowly   Complete by: As directed       Allergies as of 04/08/2021   No Known Allergies      Medication List     TAKE these medications    amLODipine 5 MG tablet Commonly known as: NORVASC Take 1 tablet (5 mg total) by mouth daily. Start taking on: April 09, 2021   aspirin 81 MG EC tablet Take 1 tablet (81 mg total) by mouth daily. Swallow whole. Start taking on: April 09, 2021   atorvastatin 80 MG tablet Commonly known as: LIPITOR Take 1 tablet (80 mg total) by mouth daily. Start taking on: April 09, 2021   clopidogrel 75 MG tablet Commonly known as: PLAVIX Take 1 tablet (75 mg total) by  mouth daily for 18 days. Start taking on: April 09, 2021   levothyroxine 100 MCG tablet Commonly known as: SYNTHROID Take 1 tablet (100 mcg total) by mouth daily at 6 (six) AM. Start taking on: April 09, 2021   magnesium oxide 400 MG tablet Commonly known as: MAG-OX Take 400 mg by mouth daily.   TYLENOL ALLERGY SINUS PO Take 2 tablets by mouth daily.        Follow-up Information     [facility] Follow up.   Why: Call to establish a primary care doctor Contact information: [address] [place] [id number] [phone]        [facility]. Schedule an appointment as soon as possible for a visit in 1 month(s).   Specialty: Neurology Why: stroke clinic Contact information: [address] [place] [address] [phone]                No Known Allergies  The results of significant diagnostics from this hospitalization (including imaging, microbiology, ancillary and laboratory) are listed below for reference.    Microbiology: Recent Results (from the past 240 hour(s))  Resp Panel by RT-PCR (Flu A&B, Covid) Nasopharyngeal Swab     Status: None   Collection Time: 04/05/21  4:57 AM   Specimen: Nasopharyngeal Swab; Nasopharyngeal(NP) swabs in vial transport medium  Result Value Ref Range Status   SARS Coronavirus 2 by RT PCR NEGATIVE NEGATIVE Final    Comment: (NOTE) SARS-CoV-2 target nucleic acids are NOT DETECTED.  The SARS-CoV-2 RNA is generally detectable in upper respiratory specimens during the acute phase of infection. The lowest concentration of SARS-CoV-2 viral copies this assay can detect is 138 copies/mL. A negative result does not preclude SARS-Cov-2 infection and should not be used as the sole basis for treatment or other patient management decisions. A negative result may occur with  improper specimen collection/handling, submission of specimen other than nasopharyngeal swab, presence of viral mutation(s) within the areas targeted by this assay, and inadequate number of viral copies(<138 copies/mL). A negative result must be combined with clinical observations, patient history, and epidemiological information. The expected result is Negative.  Fact Sheet for Patients:  BloggerCourse.com  Fact Sheet for Healthcare Providers:  SeriousBroker.it  This test is no t yet approved or cleared by the [place] FDA and  has been authorized for detection and/or diagnosis of SARS-CoV-2 by FDA under an Emergency Use Authorization (EUA). This EUA will remain  in effect (meaning this test can be used) for the duration of the COVID-19 declaration under Section 564(b)(1) of the Act, 21 U.S.C.section 360bbb-3(b)(1), unless the authorization is  terminated  or revoked sooner.       Influenza A by PCR NEGATIVE NEGATIVE Final   Influenza B by PCR NEGATIVE NEGATIVE Final    Comment: (NOTE) The Xpert Xpress SARS-CoV-2/FLU/RSV plus assay is intended as an aid in the diagnosis of influenza from Nasopharyngeal swab specimens and should not be used as a sole basis for treatment. Nasal washings and aspirates are unacceptable for Xpert Xpress SARS-CoV-2/FLU/RSV testing.  Fact Sheet for Patients: BloggerCourse.com  Fact Sheet for Healthcare Providers: SeriousBroker.it  This test is not yet approved or cleared by the [place] FDA and has been authorized for detection and/or diagnosis of SARS-CoV-2 by FDA under an Emergency Use Authorization (EUA). This EUA will remain in effect (meaning this test can be used) for the duration of the COVID-19 declaration under Section 564(b)(1) of  the Act, 21 U.S.C. section 360bbb-3(b)(1), unless the authorization is terminated or revoked.  Performed at [facility], [address] [place], [place] [address]     Procedures/Studies: [place] Pelvis 1-2 Views  Result Date: 04/05/2021 CLINICAL DATA:  Fall EXAM: PELVIS - 1-2 VIEW COMPARISON:  None. FINDINGS: There is no evidence of pelvic fracture or diastasis. No pelvic bone lesions are seen. IMPRESSION: Negative Electronically Signed   By: Tiburcio Pea M.D.   On: 04/05/2021 06:01   CT HEAD WO CONTRAST  Result Date: 04/04/2021 CLINICAL DATA:  Neuro deficit, acute, stroke suspected. Right-sided weakness. EXAM: CT HEAD WITHOUT CONTRAST TECHNIQUE: Contiguous axial images were obtained from the base of the skull through the vertex without intravenous contrast. COMPARISON:  None. 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 Mild chronic small vessel disease throughout the deep white matter. No hemorrhage or hydrocephalus. Vascular: No hyperdense vessel or unexpected  calcification. Skull: No acute calvarial abnormality. 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 Chronic small vessel disease. Electronically Signed   By: Charlett Nose M.D.   On: 04/04/2021 18:36   MR ANGIO HEAD WO CONTRAST  Result Date: 04/05/2021 CLINICAL DATA:  Follow-up examination for acute stroke. EXAM: MRA HEAD WITHOUT CONTRAST TECHNIQUE: Angiographic images of the Circle of Willis were acquired using MRA technique without intravenous contrast. COMPARISON:  Prior brain MRI from 04/04/2021 FINDINGS: Anterior circulation: Examination moderately degraded by motion artifact. Petrous, cavernous, and supraclinoid segments of both internal carotid arteries patent without flow-limiting stenosis. A1 segments patent bilaterally. Normal anterior communicating artery complex. Left ACA patent to its distal aspect without stenosis. There is an apparent focal severe distal right A2 stenosis (series 1063, image 4). This is also seen on source time-of-flight sequence (series 5, image 110). Right ACA otherwise patent. M1 segments patent without significant stenosis. Negative MCA bifurcations. Distal MCA branches perfused and symmetric. Suspected distal small vessel atheromatous irregularity. Posterior circulation: Visualized distal V4 segments patent without stenosis. Right vertebral artery slightly dominant. Right PICA origin patent. Left PICA origin not seen on this exam. Basilar grossly patent to its distal aspect without significant stenosis. Right SCA patent. There is question of a focal severe stenosis at the origin of the left SCA (series 1051, image 4). Both PCAs primarily supplied via the basilar. PCAs patent to their distal aspects without significant stenosis. Anatomic variants: None significant. Other: No intracranial aneurysm or other vascular abnormality. IMPRESSION: 1. Motion degraded exam. 2. Negative intracranial MRA for large vessel  occlusion. 3. Severe right A2 stenosis. 4. Question additional severe stenosis at the origin of the left SCA. 5. No other hemodynamically significant or correctable stenosis. Electronically Signed   By: Rise Mu M.D.   On: 04/05/2021 04:25   MR BRAIN WO CONTRAST  Result Date: 04/05/2021 CLINICAL DATA:  Initial evaluation for acute stroke, right-sided paresthesias for 24 hours. EXAM: MRI HEAD WITHOUT CONTRAST TECHNIQUE: Multiplanar, multiecho pulse sequences of the brain and surrounding structures were obtained without intravenous contrast. COMPARISON:  Head CT from earlier the same day. FINDINGS: Brain: Cerebral volume within normal limits. Mild chronic microvascular ischemic disease noted involving the supratentorial cerebral white matter. Remote lacunar infarct at the right basal ganglia/corona radiata. 1 cm focus of restricted diffusion seen involving the posterior limb of the left internal capsule, consistent with a acute ischemic infarct (series 5, image 72). No associated hemorrhage or mass effect. No other evidence for acute or subacute ischemia. Gray-white matter differentiation otherwise maintained.  No acute intracranial hemorrhage. Few small chronic micro hemorrhages noted involving the left thalamus, likely hypertensive in nature. No mass lesion or mass effect. No midline shift or hydrocephalus. No extra-axial fluid collection. Pituitary gland suprasellar region within normal limits. Midline structures intact. Vascular: Abnormal flow void within the distal right vertebral artery within the neck, likely occluded (series 10, image 1). Preserved but mildly heterogeneous flow seen within the right V4 segment distally, which could be retrograde in nature. Major intracranial vascular flow voids otherwise maintained. Skull and upper cervical spine: Craniocervical junction within normal limits. Bone marrow signal intensity normal. No scalp soft tissue abnormality. Sinuses/Orbits: Globes normal  soft tissues demonstrate no acute finding. Paranasal sinuses are largely clear. No significant mastoid effusion. Other: None. IMPRESSION: 1. 1 cm acute ischemic nonhemorrhagic infarct involving the posterior limb of the left internal capsule. 2. Remote lacunar infarct at the right basal ganglia/corona radiata. 3. Underlying mild chronic microvascular ischemic disease. 4. Absent flow void within the right vertebral artery within the neck, likely occluded. Electronically Signed   By: Rise Mu M.D.   On: 04/05/2021 01:06   MR Cervical Spine Wo Contrast  Result Date: 04/05/2021 CLINICAL DATA:  Initial evaluation for cervical radiculopathy, right arm and leg paresthesias. EXAM: MRI CERVICAL SPINE WITHOUT CONTRAST TECHNIQUE: Multiplanar, multisequence MR imaging of the cervical spine was performed. No intravenous contrast was administered. COMPARISON:  None available. FINDINGS: Alignment: Straightening with slight reversal of the normal cervical lordosis. No listhesis. Vertebrae: Vertebral body height maintained without acute or chronic fracture. Bone marrow signal intensity within normal limits. No discrete or worrisome osseous lesions. No abnormal marrow edema. Cord: Normal signal and morphology. Posterior Fossa, vertebral arteries, paraspinal tissues: Craniocervical junction within normal limits. Paraspinous soft tissues demonstrate no acute finding. 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 Disc levels: C2-C3: Negative interspace. Mild left-sided facet hypertrophy. No stenosis. C3-C4: Mild endplate spurring without significant disc bulge. Mild facet hypertrophy. No spinal stenosis. Mild bilateral C4 foraminal narrowing. C4-C5: Mild disc bulge with uncovertebral hypertrophy. Right-sided facet degeneration. No spinal stenosis. Mild left C5 foraminal narrowing. Right neural foramina remains patent. C5-C6: Degenerative intervertebral disc space narrowing. Left paracentral disc  protrusion indents the left ventral thecal sac, contacting and mildly flattening the ventral cord (series 8, image 29). Mild spinal stenosis. Superimposed uncovertebral spurring with resultant mild right C6 foraminal narrowing. Left neural foramina remains patent. C6-C7: Degenerative intervertebral disc space narrowing with diffuse disc bulge, endplate spurring, and uncovertebral hypertrophy. Broad posterior disc osteophyte flattens and partially faces the ventral thecal sac with resultant mild spinal stenosis. Foramina remain patent. C7-T1: Negative interspace. Left greater than right facet hypertrophy. No spinal stenosis. Mild left C8 foraminal narrowing. Right neural foramina remains patent. Visualized upper thoracic spine demonstrates no significant finding. IMPRESSION: 1. Left paracentral disc protrusion at C5-6 contacting and mildly flattening the left hemi cord and resulting in mild spinal stenosis. 2. Degenerative disc osteophyte at C6-7 with resultant mild spinal stenosis. 3. Mild bilateral C4, left C5, right C6, and left C8 foraminal stenosis related to disc bulging and uncovertebral disease. 4. 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 Electronically Signed   By: Rise Mu M.D.   On: 04/05/2021 01:24   DG CHEST PORT 1 VIEW  Result Date: 04/05/2021 CLINICAL DATA:  Pericardial effusion. EXAM: PORTABLE CHEST 1 VIEW COMPARISON:  None. FINDINGS: Cardiac and mediastinal contours are within normal limits. No focal pulmonary opacity. No pleural effusion or pneumothorax. No acute osseous  abnormality. IMPRESSION: No acute cardiopulmonary process. No definite evidence of pericardial effusion. Electronically Signed   By: Wiliam Ke M.D.   On: 04/05/2021 14:57   ECHOCARDIOGRAM COMPLETE  Result Date: 04/05/2021    ECHOCARDIOGRAM REPORT   Patient Name:   GINEEN EFFINGER Date of Exam: 04/05/2021 Medical Rec #:  [id number]     Height:       64.0 in Accession #:    [id number]     Weight:       247.0 lb Date of Birth:  1960-12-31      BSA:          2.140 m Patient Age:    60 years      BP:           155/109 mmHg Patient Gender: F             HR:           81 bpm. Exam Location:  Inpatient Procedure: 2D Echo, Cardiac Doppler and Color Doppler Indications:    Stroke  History:        Patient has no prior history of Echocardiogram examinations.                 Acute CVA.  Sonographer:    Vanetta Shawl Referring Phys: 2 Eduard Clos  Sonographer Comments: Technically difficult study due to poor echo windows. IMPRESSIONS  1. Left ventricular ejection fraction, by estimation, is 60 to 65%. The left ventricle has normal function. The left ventricle has no regional wall motion abnormalities. There is severe concentric left ventricular hypertrophy. Left ventricular diastolic  parameters are indeterminate.  2. Right ventricular systolic function is normal. The right ventricular size is normal. Moderately increased right ventricular wall thickness.  3. Moderate pericardial effusion, located anterior of the right venticle. The right ventricle does not expand fully suggesting increased pericardial pressure but there is no evidence of tamponade. 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  4. The mitral valve is normal in structure. No evidence of mitral valve regurgitation. No evidence of mitral stenosis.  5. The aortic valve is normal in structure. Aortic valve regurgitation is not visualized. No aortic stenosis is present.  6. The inferior vena cava is normal in size with greater than 50% respiratory variability, suggesting right atrial pressure of 3 mmHg. Conclusion(s)/Recommendation(s): Due to findings of Moderate pericardial effusion with no evidence of tamponande, a follow up echocardiogram is recommended in prior to discharge. FINDINGS  Left Ventricle: Left ventricular ejection fraction, by estimation, is 60 to 65%. The left ventricle has  normal function. The left ventricle has no regional wall motion abnormalities. The left ventricular internal cavity size was normal in size. There is  severe concentric left ventricular hypertrophy. Left ventricular diastolic parameters are indeterminate. Right Ventricle: The right ventricular size is normal. Moderately increased right ventricular wall thickness. Right ventricular systolic function is normal. Left Atrium: Left atrial size was normal in size. Right Atrium: Right atrial size was normal in size. Pericardium: The right ventricle does not expand fully suggesting increased pericardial pressure but there is no evidence of tamponade. 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 A moderately sized pericardial effusion is present. The pericardial effusion is anterior to the right ventricle. Presence of epicardial fat layer. Mitral Valve: The mitral valve is normal in structure. No evidence of mitral valve regurgitation. No evidence of mitral valve stenosis. Tricuspid Valve:  The tricuspid valve is normal in structure. Tricuspid valve regurgitation is not demonstrated. No evidence of tricuspid stenosis. Aortic Valve: The aortic valve is normal in structure. Aortic valve regurgitation is not visualized. No aortic stenosis is present. Aortic valve mean gradient measures 4.0 mmHg. Aortic valve peak gradient measures 6.7 mmHg. Aortic valve area, by VTI measures 2.53 cm. Pulmonic Valve: The pulmonic valve was normal in structure. Pulmonic valve regurgitation is not visualized. No evidence of pulmonic stenosis. Aorta: The aortic root is normal in size and structure. Venous: The inferior vena cava is normal in size with greater than 50% respiratory variability, suggesting right atrial pressure of 3 mmHg. IAS/Shunts: No atrial level shunt detected by color flow Doppler.  LEFT VENTRICLE PLAX 2D LVIDd:         3.50 cm   Diastology LVIDs:         2.50 cm   LV e' medial:     5.33 cm/s LV PW:         1.70 cm   LV E/e' medial:  11.7 LV IVS:        1.70 cm   LV e' lateral:   3.81 cm/s LVOT diam:     1.90 cm   LV E/e' lateral: 16.4 LV SV:         68 LV SV Index:   32 LVOT Area:     2.84 cm  IVC IVC diam: 1.60 cm LEFT ATRIUM             Index LA diam:        3.40 cm 1.59 cm/m LA Vol (A2C):   38.8 ml 18.13 ml/m LA Vol (A4C):   33.3 ml 15.56 ml/m LA Biplane Vol: 37.6 ml 17.57 ml/m  AORTIC VALVE                    PULMONIC VALVE AV Area (Vmax):    3.03 cm     PV Vmax:       1.22 m/s AV Area (Vmean):   2.74 cm     PV Peak grad:  6.0 mmHg AV Area (VTI):     2.53 cm AV Vmax:           129.00 cm/s AV Vmean:          91.400 cm/s AV VTI:            0.268 m AV Peak Grad:      6.7 mmHg AV Mean Grad:      4.0 mmHg LVOT Vmax:         138.00 cm/s LVOT Vmean:        88.200 cm/s LVOT VTI:          0.239 m LVOT/AV VTI ratio: 0.89  AORTA Ao Root diam: 3.20 cm Ao Asc diam:  3.30 cm MITRAL VALVE MV Area (PHT): 4.83 cm    SHUNTS MV Decel Time: 157 msec    Systemic VTI:  0.24 m MV E velocity: 62.40 cm/s  Systemic Diam: 1.90 cm MV A velocity: 84.80 cm/s MV E/A ratio:  0.74 Kardie Tobb DO Electronically signed by Thomasene Ripple DO Signature Date/Time: 04/05/2021/12:48:59 PM    Final    VAS US CAROTID (at [facility] and [facility] only)  Result Date: 04/07/2021 Carotid Arterial Duplex Study Patient Name:  AVNEET ASHMORE  Date of Exam:   04/05/2021 Medical Rec #: [id number]      Accession #:    [id number] Date of Birth: Mar 19, 1961  Patient Gender: F Patient Age:   20 years Exam Location:  [facility] Procedure:      VAS US CAROTID Referring Phys: Midge Minium --------------------------------------------------------------------------------  Indications:       CVA. Comparison Study:  no prior Performing Technologist: [place] Ponder RVS  Examination Guidelines: A complete evaluation includes B-mode imaging, spectral Doppler, color Doppler, and power Doppler as needed of all accessible portions of each vessel.  Bilateral testing is considered an integral part of a complete examination. Limited examinations for reoccurring indications may be performed as noted.  Right Carotid Findings: +----------+--------+--------+--------+------------------+--------+           PSV cm/sEDV cm/sStenosisPlaque DescriptionComments +----------+--------+--------+--------+------------------+--------+ CCA Prox  50      12              heterogenous               +----------+--------+--------+--------+------------------+--------+ CCA Distal65      14              heterogenous               +----------+--------+--------+--------+------------------+--------+ ICA Prox  51      18      1-39%   heterogenous               +----------+--------+--------+--------+------------------+--------+ ICA Distal87      26                                         +----------+--------+--------+--------+------------------+--------+ ECA       261     17                                         +----------+--------+--------+--------+------------------+--------+ +----------+--------+-------+--------+-------------------+           PSV cm/sEDV cmsDescribeArm Pressure (mmHG) +----------+--------+-------+--------+-------------------+ [id number]                                        +----------+--------+-------+--------+-------------------+ +---------+--------+--+--------+-+---------+ VertebralPSV cm/s37EDV cm/s9Antegrade +---------+--------+--+--------+-+---------+  Left Carotid Findings: +----------+--------+--------+--------+------------------+--------+           PSV cm/sEDV cm/sStenosisPlaque DescriptionComments +----------+--------+--------+--------+------------------+--------+ CCA Prox  87      13              heterogenous               +----------+--------+--------+--------+------------------+--------+ CCA Distal67      12              heterogenous                +----------+--------+--------+--------+------------------+--------+ ICA Prox  44      14      1-39%   heterogenous               +----------+--------+--------+--------+------------------+--------+ ICA Distal87      26                                         +----------+--------+--------+--------+------------------+--------+ ECA       81      6                                          +----------+--------+--------+--------+------------------+--------+ +----------+--------+--------+--------+-------------------+  PSV cm/sEDV cm/sDescribeArm Pressure (mmHG) +----------+--------+--------+--------+-------------------+ [id number]                                         +----------+--------+--------+--------+-------------------+ +---------+--------+--+--------+--+---------+ VertebralPSV cm/s39EDV cm/s13Antegrade +---------+--------+--+--------+--+---------+   Summary: Right Carotid: Velocities in the right ICA are consistent with a 1-39% stenosis. Left Carotid: Velocities in the left ICA are consistent with a 1-39% stenosis. Vertebrals: Bilateral vertebral arteries demonstrate antegrade flow. *See table(s) above for measurements and observations.  Electronically signed by Delia Heady MD on 04/07/2021 at 8:22:48 AM.    Final    ECHOCARDIOGRAM LIMITED  Result Date: 04/08/2021    ECHOCARDIOGRAM LIMITED REPORT   Patient Name:   JADEN BATCHELDER Date of Exam: 04/08/2021 Medical Rec #:  [id number]     Height:       64.0 in Accession #:    [id number]    Weight:       247.0 lb Date of Birth:  21-Dec-1960      BSA:          2.140 m Patient Age:    60 years      BP:           157/97 mmHg Patient Gender: F             HR:           77 bpm. Exam Location:  Inpatient Procedure: Limited Echo Indications:    Pericardial Effusion  History:        Patient has prior history of Echocardiogram examinations, most                 recent 04/05/2021. Risk Factors:Hypertension.  Sonographer:     Alvera Novel Referring Phys: [id number] Novak Stgermaine IMPRESSIONS  1. Left ventricular ejection fraction, by estimation, is 60 to 65%. The left ventricle has normal function. The left ventricle has no regional wall motion abnormalities. There is severe concentric left ventricular hypertrophy.  2. Moderate pericardial effusion. The pericardial effusion is anterior to the right ventricle. Comparison(s): No significant change from prior study. Prior images reviewed side by side. Conclusion(s)/Recommendation(s): Images reviewed side by side, unchanged from prior. There is a moderate pericardial effusion predominantly located anterior to the right ventricle. While there is some respiratory variation of the mitral inflow, there is no RV diastolic collapse. RA not well seen. No definitive echo evidence of tamponade, recommend clinical correlation. FINDINGS  Left Ventricle: Left ventricular ejection fraction, by estimation, is 60 to 65%. The left ventricle has normal function. The left ventricle has no regional wall motion abnormalities. There is severe concentric left ventricular hypertrophy. Pericardium: A moderately sized pericardial effusion is present. The pericardial effusion is anterior to the right ventricle. Presence of epicardial fat layer. LEFT VENTRICLE PLAX 2D LVIDd:         3.70 cm LVIDs:         2.60 cm LV PW:         1.90 cm LV IVS:        1.80 cm  LV Volumes (MOD) LV vol d, MOD A2C: 46.3 ml LV vol d, MOD A4C: 71.0 ml LV vol s, MOD A2C: 19.0 ml LV vol s, MOD A4C: 31.8 ml LV SV MOD A2C:     27.3 ml LV SV MOD A4C:     71.0 ml LV SV MOD BP:      33.0 ml Jodelle Red MD  Electronically signed by Jodelle Red MD Signature Date/Time: 04/08/2021/12:43:11 PM    Final     Labs: BNP (last 3 results) No results for input(s): BNP in the last 8760 hours. Basic Metabolic Panel: Recent Labs  Lab 04/04/21 1630 04/04/21 1647 04/05/21 0457 04/06/21 0244 04/07/21 0209 04/07/21 1355 04/08/21 0223  NA  138 139  --  138 133*  --  135  K 4.0 4.0  --  4.0 3.1*  --  3.9  CL 99 100  --  102 101  --  100  CO2 28  --   --  28 25  --  26  GLUCOSE 97 93  --  104* 101*  --  95  BUN 10 11  --  13 19  --  21*  CREATININE 1.59* 1.60* 1.56* 1.72* 1.73*  --  1.77*  CALCIUM 9.6  --   --  9.0 8.8*  --  9.0  MG  --   --   --   --   --  2.3  --    Liver Function Tests: Recent Labs  Lab 04/04/21 1630  AST 27  ALT 14  ALKPHOS 57  BILITOT 0.8  PROT 7.7  ALBUMIN 4.4   No results for input(s): LIPASE, AMYLASE in the last 168 hours. No results for input(s): AMMONIA in the last 168 hours. CBC: Recent Labs  Lab 04/04/21 1630 04/04/21 1647 04/05/21 0457 04/06/21 0244  WBC 7.6  --  9.0 8.0  NEUTROABS 5.2  --   --   --   HGB 14.0 14.6 14.1 13.1  HCT 43.4 43.0 42.7 40.7  MCV 92.1  --  91.4 91.7  PLT 252  --  236 228   Cardiac Enzymes: No results for input(s): CKTOTAL, CKMB, CKMBINDEX, TROPONINI in the last 168 hours. BNP: Invalid input(s): POCBNP CBG: Recent Labs  Lab 04/04/21 1633  GLUCAP 101*   D-Dimer No results for input(s): DDIMER in the last 72 hours. Hgb A1c No results for input(s): HGBA1C in the last 72 hours. Lipid Profile No results for input(s): CHOL, HDL, LDLCALC, TRIG, CHOLHDL, LDLDIRECT in the last 72 hours. Thyroid function studies Recent Labs    04/06/21 0244  TSH 219.795*   Anemia work up No results for input(s): VITAMINB12, FOLATE, FERRITIN, TIBC, IRON, RETICCTPCT in the last 72 hours. Urinalysis    Component Value Date/Time   COLORURINE AMBER (A) 04/05/2021 1810   APPEARANCEUR CLOUDY (A) 04/05/2021 1810   LABSPEC 1.017 04/05/2021 1810   PHURINE 6.0 04/05/2021 1810   GLUCOSEU NEGATIVE 04/05/2021 1810   HGBUR SMALL (A) 04/05/2021 1810   BILIRUBINUR NEGATIVE 04/05/2021 1810   KETONESUR NEGATIVE 04/05/2021 1810   PROTEINUR 100 (A) 04/05/2021 1810   NITRITE NEGATIVE 04/05/2021 1810   LEUKOCYTESUR LARGE (A) 04/05/2021 1810   Sepsis Labs Invalid input(s):  PROCALCITONIN,  WBC,  LACTICIDVEN Microbiology Recent Results (from the past 240 hour(s))  Resp Panel by RT-PCR (Flu A&B, Covid) Nasopharyngeal Swab     Status: None   Collection Time: 04/05/21  4:57 AM   Specimen: Nasopharyngeal Swab; Nasopharyngeal(NP) swabs in vial transport medium  Result Value Ref Range Status   SARS Coronavirus 2 by RT PCR NEGATIVE NEGATIVE Final    Comment: (NOTE) SARS-CoV-2 target nucleic acids are NOT DETECTED.  The SARS-CoV-2 RNA is generally detectable in upper respiratory specimens during the acute phase of infection. The lowest concentration of SARS-CoV-2 viral copies this assay can detect is 138 copies/mL. A negative result does not preclude  SARS-Cov-2 infection and should not be used as the sole basis for treatment or other patient management decisions. A negative result may occur with  improper specimen collection/handling, submission of specimen other than nasopharyngeal swab, presence of viral mutation(s) within the areas targeted by this assay, and inadequate number of viral copies(<138 copies/mL). A negative result must be combined with clinical observations, patient history, and epidemiological information. The expected result is Negative.  Fact Sheet for Patients:  BloggerCourse.com  Fact Sheet for Healthcare Providers:  SeriousBroker.it  This test is no t yet approved or cleared by the [place] FDA and  has been authorized for detection and/or diagnosis of SARS-CoV-2 by FDA under an Emergency Use Authorization (EUA). This EUA will remain  in effect (meaning this test can be used) for the duration of the COVID-19 declaration under Section 564(b)(1) of the Act, 21 U.S.C.section 360bbb-3(b)(1), unless the authorization is terminated  or revoked sooner.       Influenza A by PCR NEGATIVE NEGATIVE Final   Influenza B by PCR NEGATIVE NEGATIVE Final    Comment: (NOTE) The Xpert Xpress  SARS-CoV-2/FLU/RSV plus assay is intended as an aid in the diagnosis of influenza from Nasopharyngeal swab specimens and should not be used as a sole basis for treatment. Nasal washings and aspirates are unacceptable for Xpert Xpress SARS-CoV-2/FLU/RSV testing.  Fact Sheet for Patients: BloggerCourse.com  Fact Sheet for Healthcare Providers: SeriousBroker.it  This test is not yet approved or cleared by the [place] FDA and has been authorized for detection and/or diagnosis of SARS-CoV-2 by FDA under an Emergency Use Authorization (EUA). This EUA will remain in effect (meaning this test can be used) for the duration of the COVID-19 declaration under Section 564(b)(1) of the Act, 21 U.S.C. section 360bbb-3(b)(1), unless the authorization is terminated or revoked.  Performed at [facility], [address] [place], [place] [address]      Time coordinating discharge: 35 minutes  SIGNED: Lanae Boast, MD  [facility] 04/08/2021, 1:29 PM  If 7PM-7AM, please contact night-coverage [email or web address]

## 2021-04-08 NOTE — Progress Notes (Signed)
Pt arived to unit via bed, pt is alert and oriented, able to make needs known, lungs clear. Belongings at bedside, pt wears glasses, missing teeth. Educated about rehab. All needs met, call bell in reach, alarm on and functioning. Pt does report that had fall while in ED.

## 2021-04-08 NOTE — Progress Notes (Signed)
Meredith Staggers, MD  Physician Physical Medicine and Rehabilitation PMR Pre-admission    Signed Date of Service:  04/07/2021  1:36 PM  Related encounter: ED to Hosp-Admission (Discharged) from 04/04/2021 in [facility]   Signed      Show:Clear all [x]Written[x]Templated[x]Copied  Added by: [x]Boyette, Vertis Kelch, RN[x]Swartz, Celesta Gentile, MD  []Hover for details                                                                                                                                                                                                                                                                                                                                                                                                                                                                PMR Admission Coordinator Pre-Admission Assessment   Patient: Christine Fritz is an 60 y.o., female MRN: [id number] DOB: 1961-01-21 Height: 5' 4" (162.6 cm) Weight: 112 kg   Insurance Information HMO:     PPO:      PCP:      IPA:      80/20:      OTHER:  PRIMARYRoseanna Fritz      Policy#: [id number]      Subscriber: pt CM Name: April      Phone#:  [phone]     Fax#: [phone] Pre-Cert#: [id number]       Employer:  Benefits:  Phone #: [phone]     Name: 11/16 Eff. Date: 06/03/2000     Deduct: none      Out of Pocket Max: $6500      Life Max: none CIR: $175 co pay per day days 1 until 5      SNF: no coverage Outpatient: $30 co pay per visit     Co-Pay: visits per medcail neccesity Home Health: 70%      Co-Pay: visits per medcial neccesity DME: 70%     Co-Pay: 30% Providers: in network  SECONDARY: none      Policy#:      Phone#:    Development worker, community:       Phone#:    The Engineer, petroleum" for  patients in Inpatient Rehabilitation Facilities with attached "Privacy Act [place] Records" was provided and verbally reviewed with: N/A   Emergency Contact Information Contact Information       Name Relation Home Work [place] [phone]   [phone]    Schutt,[name] Daughter     [phone]         Current Medical History  Patient Admitting Diagnosis: CVA   History of Present Illness:  60 year old right-handed female with unremarkable past medical history except obesity with BMI 42.40 who has not been to a doctor for more than 20 years.    Presented 04/04/2021 with with transient right side weakness.  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  Remote lacunar infarct of the right basal ganglia/corona radiata.  MRI cervical spine left paracentral disc protrusion at C5-6 contacting and mildly flattening the left hemicord resulting in mild spinal stenosis.  MRA negative for large vessel occlusion.  Noted severe right A2 stenosis.  Admission chemistries unremarkable except TSH 219.795 creatinine 1.59, urine drug screen negative, LDL 259.  Echocardiogram ejection fraction of 60 to 65%, moderate pericardial effusion no wall motion abnormalities.  Placed on aspirin 81 mg daily and Plavix 75 mg day x3 weeks then aspirin alone for CVA prophylaxis.  Maintain on Lovenox for DVT prophylaxis.  She was placed on Synthroid for findings of elevated TSH.  Therapy evaluations completed due to patient's right side weakness decreased functional mobility was admitted for a comprehensive rehab program.   Complete NIHSS TOTAL: 4   Patient's medical record from [facility] has been reviewed by the rehabilitation admission coordinator and physician.   Past Medical History  History reviewed. No pertinent past medical history.   Has the patient had major surgery during 100 days prior to admission?  No   Family History   family history includes Hypertension in her father.   Current Medications   Current Facility-Administered Medications:     stroke: mapping our early stages of recovery book, , Does not apply, Once, Rise Patience, MD   acetaminophen (TYLENOL) tablet 650 mg, 650 mg, Oral, Q4H PRN, 650 mg at 04/07/21 2204 **OR** acetaminophen (TYLENOL) 160 MG/5ML solution 650 mg, 650 mg, Per Tube, Q4H PRN **OR** acetaminophen (TYLENOL) suppository 650 mg, 650 mg, Rectal, Q4H PRN, Rise Patience, MD   amLODipine (NORVASC) tablet 5 mg, 5 mg, Oral, Daily, Bonnielee Haff, MD, 5 mg at 04/07/21 0841   aspirin EC tablet 81 mg, 81 mg, Oral, Daily, Rosalin Hawking, MD, 81 mg at 04/07/21 0840  atorvastatin (LIPITOR) tablet 80 mg, 80 mg, Oral, Daily, Rosalin Hawking, MD, 80 mg at 04/07/21 0841   clopidogrel (PLAVIX) tablet 75 mg, 75 mg, Oral, Daily, Rosalin Hawking, MD, 75 mg at 04/07/21 0840   enoxaparin (LOVENOX) injection 50 mg, 50 mg, Subcutaneous, Q24H, Rise Patience, MD, 50 mg at 04/07/21 0841   levothyroxine (SYNTHROID) tablet 100 mcg, 100 mcg, Oral, Q0600, Bonnielee Haff, MD, 100 mcg at 04/08/21 0525   sodium chloride flush (NS) 0.9 % injection 3 mL, 3 mL, Intravenous, Once, Trifan, Carola Rhine, MD   Patients Current Diet:  Diet Order                  Diet Heart Room service appropriate? Yes; Fluid consistency: Thin  Diet effective now                       Precautions / Restrictions Precautions Precautions: Fall Precaution Comments: R weakness, incoordination Restrictions Weight Bearing Restrictions: No    Has the patient had 2 or more falls or a fall with injury in the past year? No Did Fall in the ER when up without asisst   Prior Activity Level Community (5-7x/wk): independent and driving   Prior Functional Level Self Care: Did the patient need help bathing, dressing, using the toilet or eating? Independent   Indoor Mobility: Did the patient need assistance  with walking from room to room (with or without device)? Independent   Stairs: Did the patient need assistance with internal or external stairs (with or without device)? Independent   Functional Cognition: Did the patient need help planning regular tasks such as shopping or remembering to take medications? Independent   Patient Information Are you of Hispanic, Latino/a,or Spanish origin?: A. No, not of Hispanic, Latino/a, or Spanish origin What is your race?: A. White Do you need or want an interpreter to communicate with a doctor or health care staff?: 0. No   Patient's Response To:  Health Literacy and Transportation Is the patient able to respond to health literacy and transportation needs?: Yes Health Literacy - How often do you need to have someone help you when you read instructions, pamphlets, or other written material from your doctor or pharmacy?: Never In the past 12 months, has lack of transportation kept you from medical appointments or from getting medications?: No In the past 12 months, has lack of transportation kept you from meetings, work, or from getting things needed for daily living?: No   Home Assistive Devices / Equipment Home Equipment: None   Prior Device Use: Indicate devices/aids used by the patient prior to current illness, exacerbation or injury? None of the above   Current Functional Level Cognition   Arousal/Alertness: Awake/alert Overall Cognitive Status: Impaired/Different from baseline Current Attention Level: Sustained Orientation Level: Oriented X4 Safety/Judgement: Decreased awareness of safety General Comments: Slow processing, required verbal cues for safety wtih OOB tasks. Pt a little emotional during session due to deficits    Extremity Assessment (includes Sensation/Coordination)   Upper Extremity Assessment: RUE deficits/detail RUE Deficits / Details: AROM is WFL, sensation and proprioception seem intact with slow/deliberate movements.  Sensory motor coordination is impaired. pt able to lift cup to mouth and type in Ipad wtih R hand RUE Sensation: decreased light touch, decreased proprioception RUE Coordination: decreased fine motor  Lower Extremity Assessment: Defer to PT evaluation RLE Deficits / Details: Very uncoordinated during heel to shin test. Able to perform SLR with increased time. Strength grossly 4/5  ADLs   Overall ADL's : Needs assistance/impaired Eating/Feeding: Supervision/ safety, Set up Eating/Feeding Details (indicate cue type and reason): unable to feed self with dominant RUE Grooming: Minimal assistance, Sitting Grooming Details (indicate cue type and reason): min A for verbal cues to use RUE for fucntional tasks - educated on compensatory tehcniques to use RUE as a functional assist Upper Body Bathing: Minimal assistance Lower Body Bathing: Moderate assistance, Sit to/from stand Upper Body Dressing : Moderate assistance Lower Body Dressing: Moderate assistance, Sit to/from stand Toilet Transfer: Minimal assistance, BSC/3in1, Rolling walker (2 wheels) Toilet Transfer Details (indicate cue type and reason): min A to rise and decend; verbal cues for safety and positioning Toileting- Clothing Manipulation and Hygiene: Maximal assistance Functional mobility during ADLs: Minimal assistance General ADL Comments: Spent increased time educating pt on importance of repetition of funtional tasks with RUE i.e. eating sandwich, drinking from cup (two hands if needed), brushing teeth, typing on ipad, etc.     Mobility   Overal bed mobility: Needs Assistance Bed Mobility: Supine to Sit Supine to sit: Min assist Sit to supine: Mod assist, +2 for safety/equipment General bed mobility comments: up in recliner     Transfers   Overall transfer level: Needs assistance Equipment used: Rolling walker (2 wheels) Transfers: Sit to/from Stand, Bed to chair/wheelchair/BSC Sit to Stand: Min assist Bed to/from  chair/wheelchair/BSC transfer type:: Step pivot Step pivot transfers: Mod assist General transfer comment: up to stand from recliner and BSC increased time and cues for positioning; step to and from [facility] with RW, increased time mod cues and assist for balance, walker management     Ambulation / Gait / Stairs / Wheelchair Mobility   Ambulation/Gait Ambulation/Gait assistance: Mod assist, Min assist Gait Distance (Feet): 45 Feet Assistive device: Rolling walker (2 wheels) Gait Pattern/deviations: Step-to pattern, Decreased dorsiflexion - right, Decreased weight shift to left, Shuffle, Decreased step length - right, Trunk flexed General Gait Details: Cues for sequencing with walker and step to pattern with increased time and at times clearance for R foot, help for walker management at times and balance and especially with turns Gait velocity: decr     Posture / Balance Balance Overall balance assessment: Needs assistance Sitting-balance support: Feet supported Sitting balance-Leahy Scale: Fair Standing balance support: During functional activity, Single extremity supported Standing balance-Leahy Scale: Poor Standing balance comment: mod assist for balance while pt using L hand to wipe after toileting     Special needs/care consideration New HTN diagnosis this admit    Previous Home Environment  Living Arrangements: Spouse/significant other, Other relatives  Lives With: Spouse Available Help at Discharge:  (spouse works but can have other family assist; has 5 adult children) Type of Home: House Home Layout: Able to live on main level with bedroom/bathroom, Two level Home Access: Stairs to enter Entrance Stairs-Rails: Right, Left Entrance Stairs-Number of Steps: 3 Bathroom Shower/Tub: Tub/shower unit, Industrial/product designer: Yes Home Care Services: No   Discharge Living Setting Plans for Discharge Living Setting: Patient's home, Lives with (comment)  (spouse) Type of Home at Discharge: House Discharge Home Layout: One level Discharge Home Access: Stairs to enter Entrance Stairs-Rails: Right, Left Entrance Stairs-Number of Steps: 3 Discharge Bathroom Shower/Tub: Tub/shower unit, Curtain Discharge Bathroom Toilet: Standard Discharge Bathroom Accessibility: Yes How Accessible: Accessible via walker Does the patient have any problems obtaining your medications?: No   Social/Family/Support Systems Patient Roles: Spouse, Parent Contact Information: spouse, Eddie Dibbles Anticipated Caregiver: spouse and other family members Anticipated Caregiver's  Contact Information: see contacts Ability/Limitations of Caregiver: spouse works day shift Caregiver Availability: Intermittent (patient to ask adult children to provide intermittent assist when spouse works) Discharge Plan Discussed with Primary Caregiver: Yes Is Caregiver In Agreement with Plan?: Yes Does Caregiver/Family have Issues with Lodging/Transportation while Pt is in Rehab?: No   Goals Patient/Family Goal for Rehab: Mod I to intermittent supervision with PT, OT and SLP Expected length of stay: ELOS 7 to 10 days Pt/Family Agrees to Admission and willing to participate: Yes Program Orientation Provided & Reviewed with Pt/Caregiver Including Roles  & Responsibilities: Yes   Decrease burden of Care through IP rehab admission: n/a   Possible need for SNF placement upon discharge: not anticipated   Patient Condition: I have reviewed medical records from [facility], spoken with CM, and patient. I met with patient at the bedside for inpatient rehabilitation assessment.  Patient will benefit from ongoing PT, OT, and SLP, can actively participate in 3 hours of therapy a day 5 days of the week, and can make measurable gains during the admission.  Patient will also benefit from the coordinated team approach during an Inpatient Acute Rehabilitation admission.  The patient will receive intensive  therapy as well as Rehabilitation physician, nursing, social worker, and care management interventions.  Due to bladder management, bowel management, safety, skin/wound care, disease management, medication administration, pain management, and patient education the patient requires 24 hour a day rehabilitation nursing.  The patient is currently min asisst overall with mobility and basic ADLs.  Discharge setting and therapy post discharge at home with outpatient is anticipated.  Patient has agreed to participate in the Acute Inpatient Rehabilitation Program and will admit today.   Preadmission Screen Completed By:  Cleatrice Burke, 04/08/2021 10:30 AM ______________________________________________________________________   Discussed status with Dr. Naaman Plummer on 04/08/2021 at 1029 and received approval for admission today.   Admission Coordinator:  Cleatrice Burke, RN, time  6203 Date  04/08/2021    Assessment/Plan: Diagnosis: Left PLIC infarct Does the need for close, 24 hr/day Medical supervision in concert with the patient's rehab needs make it unreasonable for this patient to be served in a less intensive setting? Yes Co-Morbidities requiring supervision/potential complications: obesity, HTN, constipation Due to bladder management, bowel management, safety, skin/wound care, disease management, medication administration, pain management, and patient education, does the patient require 24 hr/day rehab nursing? Yes Does the patient require coordinated care of a physician, rehab nurse, PT, OT, and SLP to address physical and functional deficits in the context of the above medical diagnosis(es)? Yes Addressing deficits in the following areas: balance, endurance, locomotion, strength, transferring, bowel/bladder control, bathing, dressing, feeding, grooming, cognition, speech, and psychosocial support Can the patient actively participate in an intensive therapy program of at least 3 hrs of  therapy 5 days a week? Yes The potential for patient to make measurable gains while on inpatient rehab is excellent Anticipated functional outcomes upon discharge from inpatient rehab: modified independent and supervision PT, modified independent and supervision OT, independent SLP Estimated rehab length of stay to reach the above functional goals is: 7-10 days Anticipated discharge destination: Home 10. Overall Rehab/Functional Prognosis: excellent     MD Signature: Meredith Staggers, MD, [place] [facility] 04/08/2021          Revision History                          Note Details  Author Naaman Plummer,  Celesta Gentile, MD File Time 04/08/2021 10:34 AM  Author Type Physician Status Signed  Last Editor Meredith Staggers, MD Service Physical Medicine and [place] # [id number] Admit Date 04/08/2021

## 2021-04-08 NOTE — Plan of Care (Signed)
  Problem: Education: Goal: Knowledge of General Education information will improve Description: Including pain rating scale, medication(s)/side effects and non-pharmacologic comfort measures Outcome: Progressing   Problem: Health Behavior/Discharge Planning: Goal: Ability to manage health-related needs will improve Outcome: Progressing   Problem: Clinical Measurements: Goal: Ability to maintain clinical measurements within normal limits will improve Outcome: Progressing Goal: Will remain free from infection Outcome: Progressing Goal: Diagnostic test results will improve Outcome: Progressing Goal: Respiratory complications will improve Outcome: Progressing Goal: Cardiovascular complication will be avoided Outcome: Progressing   Problem: Activity: Goal: Risk for activity intolerance will decrease Outcome: Progressing   Problem: Nutrition: Goal: Adequate nutrition will be maintained Outcome: Progressing   Problem: Coping: Goal: Level of anxiety will decrease Outcome: Progressing   Problem: Elimination: Goal: Will not experience complications related to bowel motility Outcome: Progressing Goal: Will not experience complications related to urinary retention Outcome: Progressing   Problem: Pain Managment: Goal: General experience of comfort will improve Outcome: Progressing   Problem: Safety: Goal: Ability to remain free from injury will improve Outcome: Progressing   Problem: Skin Integrity: Goal: Risk for impaired skin integrity will decrease Outcome: Progressing   Problem: Education: Goal: Knowledge of disease or condition will improve Outcome: Progressing Goal: Knowledge of secondary prevention will improve (SELECT ALL) Outcome: Progressing Goal: Knowledge of patient specific risk factors will improve (INDIVIDUALIZE FOR PATIENT) Outcome: Progressing   Problem: Coping: Goal: Will verbalize positive feelings about self Outcome: Progressing Goal: Will  identify appropriate support needs Outcome: Progressing   Problem: Health Behavior/Discharge Planning: Goal: Ability to manage health-related needs will improve Outcome: Progressing   Problem: Self-Care: Goal: Ability to participate in self-care as condition permits will improve Outcome: Progressing Goal: Verbalization of feelings and concerns over difficulty with self-care will improve Outcome: Progressing Goal: Ability to communicate needs accurately will improve Outcome: Progressing   Problem: Nutrition: Goal: Risk of aspiration will decrease Outcome: Progressing   Problem: Ischemic Stroke/TIA Tissue Perfusion: Goal: Complications of ischemic stroke/TIA will be minimized Outcome: Progressing

## 2021-04-08 NOTE — Progress Notes (Signed)
Inpatient Rehabilitation Admissions Coordinator   CIR bed is available to admit her to today. I met with patient with her Mom at bedside and they are in agreement to admit. I have alerted acute team and TOC. I will make the arrangements to admit.  Danne Baxter, RN, MSN Rehab Admissions Coordinator [phone] 04/08/2021 10:21 AM

## 2021-04-09 LAB — CBC WITH DIFFERENTIAL/PLATELET
Abs Immature Granulocytes: 0.02 10*3/uL (ref 0.00–0.07)
Basophils Absolute: 0.1 10*3/uL (ref 0.0–0.1)
Basophils Relative: 1 %
Eosinophils Absolute: 0.3 10*3/uL (ref 0.0–0.5)
Eosinophils Relative: 5 %
HCT: 38.5 % (ref 36.0–46.0)
Hemoglobin: 12.6 g/dL (ref 12.0–15.0)
Immature Granulocytes: 0 %
Lymphocytes Relative: 24 %
Lymphs Abs: 1.8 10*3/uL (ref 0.7–4.0)
MCH: 30 pg (ref 26.0–34.0)
MCHC: 32.7 g/dL (ref 30.0–36.0)
MCV: 91.7 fL (ref 80.0–100.0)
Monocytes Absolute: 0.5 10*3/uL (ref 0.1–1.0)
Monocytes Relative: 6 %
Neutro Abs: 4.6 10*3/uL (ref 1.7–7.7)
Neutrophils Relative %: 64 %
Platelets: 221 10*3/uL (ref 150–400)
RBC: 4.2 MIL/uL (ref 3.87–5.11)
RDW: 14.8 % (ref 11.5–15.5)
WBC: 7.2 10*3/uL (ref 4.0–10.5)
nRBC: 0 % (ref 0.0–0.2)

## 2021-04-09 LAB — COMPREHENSIVE METABOLIC PANEL
ALT: 13 U/L (ref 0–44)
AST: 21 U/L (ref 15–41)
Albumin: 4 g/dL (ref 3.5–5.0)
Alkaline Phosphatase: 56 U/L (ref 38–126)
Anion gap: 8 (ref 5–15)
BUN: 22 mg/dL — ABNORMAL HIGH (ref 6–20)
CO2: 26 mmol/L (ref 22–32)
Calcium: 9.3 mg/dL (ref 8.9–10.3)
Chloride: 103 mmol/L (ref 98–111)
Creatinine, Ser: 1.71 mg/dL — ABNORMAL HIGH (ref 0.44–1.00)
GFR, Estimated: 34 mL/min — ABNORMAL LOW (ref >60)
Glucose, Bld: 99 mg/dL (ref 70–99)
Potassium: 3.8 mmol/L (ref 3.5–5.1)
Sodium: 137 mmol/L (ref 135–145)
Total Bilirubin: 0.9 mg/dL (ref 0.3–1.2)
Total Protein: 7.2 g/dL (ref 6.5–8.1)

## 2021-04-09 MED ORDER — ENOXAPARIN SODIUM 60 MG/0.6ML IJ SOSY
55.0000 mg | PREFILLED_SYRINGE | INTRAMUSCULAR | Status: DC
  Administered 2021-04-09 – 2021-04-12 (×4): 55 mg via SUBCUTANEOUS
  Filled 2021-04-09 (×3): qty 0.6

## 2021-04-09 MED ORDER — POLYETHYLENE GLYCOL 3350 17 G PO PACK
17.0000 g | PACK | Freq: Every day | ORAL | Status: DC
Start: 2021-04-09 — End: 2021-04-17
  Administered 2021-04-09 – 2021-04-16 (×8): 17 g via ORAL
  Filled 2021-04-09 (×9): qty 1

## 2021-04-09 MED ORDER — BISACODYL 10 MG RE SUPP: 10.0000 mg | Freq: Every day | RECTAL | Status: DC | PRN

## 2021-04-09 MED ORDER — SENNOSIDES-DOCUSATE SODIUM 8.6-50 MG PO TABS
2.0000 | ORAL_TABLET | Freq: Every day | ORAL | Status: DC
  Administered 2021-04-14 – 2021-04-15 (×2): 2 via ORAL
  Filled 2021-04-09 (×4): qty 2

## 2021-04-09 NOTE — Progress Notes (Signed)
Inpatient Rehabilitation  Patient information reviewed and entered into eRehab system by Corrisa Gibby M. Ashir Kunz, M.A., CCC/SLP, PPS Coordinator.  Information including medical coding, functional ability and quality indicators will be reviewed and updated through discharge.    

## 2021-04-09 NOTE — Progress Notes (Signed)
Inpatient Rehabilitation Care Coordinator Assessment and Plan Patient Details  Name: Christine Fritz MRN: [id number] Date of Birth: 1960/06/24  Today's Date: 04/09/2021  Hospital Problems: Principal Problem:   Small vessel disease, cerebrovascular  Past Medical History: History reviewed. No pertinent past medical history. Past Surgical History: History reviewed. No pertinent surgical history. Social History:  reports that she has never smoked. She has never used smokeless tobacco. She reports that she does not currently use alcohol. She reports that she does not currently use drugs.  Family / Support Systems Marital Status: Married Patient Roles: Spouse, Parent Spouse/Significant Other: Christine Fritz  [phone] Children: Christine Fritz-daughter [phone]  Four other children four in [place] and one in [place] Other Supports: Parents and in-laws Anticipated Caregiver: Husband and other family members Ability/Limitations of Caregiver: Husband works long hours 6-9 pm other family members can assist-her Mom Caregiver Availability: 24/7 Family Dynamics: Close with all five grown children, their parents are still living and involved. Pt reports she has good family and friends support  Social History Preferred language: English Religion: Baptist Cultural Background: No issues Education: Some Charity fundraiser - How often do you need to have someone help you when you read instructions, pamphlets, or other written material from your doctor or pharmacy?: Never Writes: Yes Employment Status: Unemployed Date Retired/Disabled/Unemployed: Engineer, site Issues: no issues Guardian/Conservator: none-according to MD pt is capable of making her own decisions while here   Abuse/Neglect Abuse/Neglect Assessment Can Be Completed: Yes Physical Abuse: Denies Verbal Abuse: Denies Sexual Abuse: Denies Exploitation of patient/patient's resources: Denies Self-Neglect: Denies  Patient  response to: Social Isolation - How often do you feel lonely or isolated from those around you?: Rarely  Emotional Status Pt's affect, behavior and adjustment status: Pt is motivated to recover and regain her independence, she is not one to be the one needing care. She has seen some progress and is feeling encouraged. She is hopeful this will continue and she will do well here Recent Psychosocial Issues: Pt thought she was healthy but is aware now she will need to go to the MD and be monitored. Psychiatric History: No issues may benefit from seeing neuro-psych while here for coping Substance Abuse History: No issues  Patient / Family Perceptions, Expectations & Goals Pt/Family understanding of illness & functional limitations: Pt is able to explain her stroke and deficits as a result, she does talk with the MD and feels she understands going forward her treatment plan. She is aware her blood pressure and cholesterol was high and needs to be monitored and she medicated. Premorbid pt/family roles/activities: Wife, Mom, daughter, friend, etc Anticipated changes in roles/activities/participation: resume Pt/family expectations/goals: Pt states: " I am hopeful I will do well and recover and be able to take care of myself, before I leave here." Husband states: " We will do what is needed for her."  [employer]: None Premorbid Home Care/DME Agencies: None Transportation available at discharge: Self and family Is the patient able to respond to transportation needs?: Yes In the past 12 months, has lack of transportation kept you from medical appointments or from getting medications?: No In the past 12 months, has lack of transportation kept you from meetings, work, or from getting things needed for daily living?: No Resource referrals recommended: Neuropsychology  Discharge Planning Living Arrangements: Spouse/significant other Support Systems: Spouse/significant other,  Children, Parent, Other relatives, Friends/neighbors Type of Residence: Private residence Insurance Resources: Media planner (specify) Print production planner) Surveyor, quantity Resources: Garment/textile technologist Screen  Referred: No Living Expenses: Own Money Management: Patient, Spouse Does the patient have any problems obtaining your medications?: No (wasn't going to the MD) Home Management: Pt Patient/Family Preliminary Plans: Return home with husband who does work during the day and some nights. Her Mom can come to her home or her husband can drop her off at [facility] home when working if someone needs to be there iwth pt at discharge. Pt is hopeful she will be able to be home alone and safe when husband is working. Await team's evaluations Care Coordinator Barriers to Discharge: Medication compliance, Insurance for SNF coverage Care Coordinator Anticipated Follow Up Needs: HH/OP  Clinical Impression Pleasant motivated female who is aware of the importance of seeing PCP and will start going to them at discharge. She is hopeful she will make good progress and will be able to be home alone while husband works, but does have an option for Mom to stay with her also if needed. Would benefit from seeing neuro-psych while here and will await team's evaluations.  Lucy Chris 04/09/2021, 12:31 PM

## 2021-04-09 NOTE — H&P (Signed)
Physical Medicine and Rehabilitation Admission H&P        Chief Complaint  Patient presents with   Weakness  : HPI: Christine Fritz is a 60 year old right-handed female with unremarkable past medical history except obesity with BMI 42.40 who has not been to a doctor for more than 20 years.  Per chart review patient lives with spouse.  Independent prior to admission.  Two-level home bed and bath main level with 3 steps to entry.  She does have good family support.  Presented 04/04/2021 with with transient right side weakness.  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  Remote lacunar infarct of the right basal ganglia/corona radiata.  MRI cervical spine left paracentral disc protrusion at C5-6 contacting and mildly flattening the left hemicord resulting in mild spinal stenosis.  MRA negative for large vessel occlusion.  Noted severe right A2 stenosis.  Admission chemistries unremarkable except TSH 219.795 creatinine 1.59, urine drug screen negative, LDL 259.  Echocardiogram ejection fraction of 60 to 65%, moderate pericardial effusion no wall motion abnormalities.  Placed on aspirin 81 mg daily and Plavix 75 mg day x3 weeks then aspirin alone for CVA prophylaxis.  Maintain on Lovenox for DVT prophylaxis.  She was placed on Synthroid for findings of elevated TSH.  Therapy evaluations completed due to patient's right side weakness decreased functional mobility was admitted for a comprehensive rehab program.   Review of Systems  Constitutional:  Negative for chills and fever.  HENT:  Negative for hearing loss.   Eyes:  Negative for blurred vision and double vision.  Respiratory:  Negative for cough and shortness of breath.   Cardiovascular:  Negative for chest pain, palpitations and leg swelling.  Gastrointestinal:  Positive for constipation. Negative for heartburn, nausea and vomiting.  Genitourinary:  Negative for dysuria,  flank pain and hematuria.  Musculoskeletal:  Positive for myalgias.  Skin:  Negative for rash.  Neurological:  Positive for dizziness and weakness.       Occasional headaches  All other systems reviewed and are negative. History reviewed. No pertinent past medical history. History reviewed. No pertinent surgical history.      Family History  Problem Relation Age of Onset   Hypertension Father     Stroke Neg Hx      Social History:  reports that she has never smoked. She has never used smokeless tobacco. She reports that she does not currently use alcohol. She reports that she does not currently use drugs. Allergies: No Known Allergies       Medications Prior to Admission  Medication Sig Dispense Refill   Chlorphen-Pseudoephed-APAP (TYLENOL ALLERGY SINUS PO) Take 2 tablets by mouth daily.       magnesium oxide (MAG-OX) 400 MG tablet Take 400 mg by mouth daily.          Drug Regimen Review Drug regimen was reviewed and remains appropriate with no significant issues identified   Home: Home Living Family/patient expects to be discharged to:: Private residence Living Arrangements: Spouse/significant other, Other relatives Available Help at Discharge:  (spouse works but can have other family assist; has 5 adult children) Type of Home: House Home Access: Stairs to enter [employer] of Steps: 3 Entrance Stairs-Rails: Right, Left Home Layout: Able to live on main level with bedroom/bathroom, Two level Bathroom Shower/Tub: Tub/shower unit, Architectural technologist: Standard Bathroom Accessibility: Yes Home Equipment: None  Lives With: Spouse   Functional History:  Prior Function Prior Level of Function : Independent/Modified Independent ADLs Comments: enjoys baking; spending time with grand childresn   Functional Status:  Mobility: Bed Mobility Overal bed mobility: Needs Assistance Bed Mobility: Supine to Sit Supine to sit: Min assist Sit to supine: Mod assist, +2  for safety/equipment General bed mobility comments: up in recliner Transfers Overall transfer level: Needs assistance Equipment used: Rolling walker (2 wheels) Transfers: Sit to/from Stand, Bed to chair/wheelchair/BSC Sit to Stand: Min assist Bed to/from chair/wheelchair/BSC transfer type:: Step pivot Step pivot transfers: Mod assist General transfer comment: up to stand from recliner and BSC increased time and cues for positioning; step to and from [facility] with RW, increased time mod cues and assist for balance, walker management Ambulation/Gait Ambulation/Gait assistance: Mod assist, Min assist Gait Distance (Feet): 45 Feet Assistive device: Rolling walker (2 wheels) Gait Pattern/deviations: Step-to pattern, Decreased dorsiflexion - right, Decreased weight shift to left, Shuffle, Decreased step length - right, Trunk flexed General Gait Details: Cues for sequencing with walker and step to pattern with increased time and at times clearance for R foot, help for walker management at times and balance and especially with turns Gait velocity: decr   ADL: ADL Overall ADL's : Needs assistance/impaired Eating/Feeding: Supervision/ safety, Set up Eating/Feeding Details (indicate cue type and reason): unable to feed self with dominant RUE Grooming: Minimal assistance, Sitting Grooming Details (indicate cue type and reason): min A for verbal cues to use RUE for fucntional tasks - educated on compensatory tehcniques to use RUE as a functional assist Upper Body Bathing: Minimal assistance Lower Body Bathing: Moderate assistance, Sit to/from stand Upper Body Dressing : Moderate assistance Lower Body Dressing: Moderate assistance, Sit to/from stand Toilet Transfer: Minimal assistance, BSC/3in1, Rolling walker (2 wheels) Toilet Transfer Details (indicate cue type and reason): min A to rise and decend; verbal cues for safety and positioning Toileting- Clothing Manipulation and Hygiene: Maximal  assistance Functional mobility during ADLs: Minimal assistance General ADL Comments: Spent increased time educating pt on importance of repetition of funtional tasks with RUE i.e. eating sandwich, drinking from cup (two hands if needed), brushing teeth, typing on ipad, etc.   Cognition: Cognition Overall Cognitive Status: Impaired/Different from baseline Arousal/Alertness: Awake/alert Orientation Level: Oriented X4 Cognition Arousal/Alertness: Awake/alert Behavior During Therapy: WFL for tasks assessed/performed (easily tearful) Overall Cognitive Status: Impaired/Different from baseline Area of Impairment: Safety/judgement Current Attention Level: Sustained Safety/Judgement: Decreased awareness of safety Awareness: Emergent Problem Solving: Slow processing, Requires verbal cues, Requires tactile cues General Comments: Slow processing, required verbal cues for safety wtih OOB tasks. Pt a little emotional during session due to deficits   Physical Exam: Blood pressure (!) 141/100, pulse 75, temperature 98.6 F (37 C), temperature source Axillary, resp. rate 16, height 5\' 4"  (1.626 m), weight 112 kg, SpO2 97 %. Physical Exam Constitutional:      Appearance: She is obese.  HENT:     Head: Normocephalic and atraumatic.     Right Ear: External ear normal.     Left Ear: External ear normal.     Nose: Nose normal.     Mouth/Throat:     Pharynx: Oropharynx is clear.  Eyes:     Conjunctiva/sclera: Conjunctivae normal.     Pupils: Pupils are equal, round, and reactive to light.  Cardiovascular:     Rate and Rhythm: Normal rate and regular rhythm.     Heart sounds: No murmur heard.   No gallop.  Pulmonary:     Effort: Pulmonary effort is normal. No  respiratory distress.     Breath sounds: No wheezing or rales.  Abdominal:     General: Bowel sounds are normal. There is no distension.     Palpations: Abdomen is soft.     Tenderness: There is no abdominal tenderness.  Musculoskeletal:         General: No tenderness or deformity.     Cervical back: Normal range of motion and neck supple.  Skin:    General: Skin is warm and dry.  Neurological:     Comments: Patient is awake alert makes eye contact with examiner.  Cooperative with exam.  Follows simple commands.  Provides name and age.  Fair awareness and insight. Functional memory.  Speech sl dysarthric. No focal CN abnormalities although gaze sl dysconjugate at times (denies diplopia or blurred vision though). RUE 4+/5. RLE 4+/5. LUE and LLE 5/5. Senses light touch and pain in all 4's. DTR's 1+, toes not-reactive. Decreased FMC and dysmetria of RUE and RLE present.   Psychiatric:        Mood and Affect: Mood normal.        Behavior: Behavior normal.      Lab Results Last 48 Hours        Results for orders placed or performed during the hospital encounter of 04/04/21 (from the past 48 hour(s))  Basic metabolic panel     Status: Abnormal    Collection Time: 04/07/21  2:09 AM  Result Value Ref Range    Sodium 133 (L) 135 - 145 mmol/L    Potassium 3.1 (L) 3.5 - 5.1 mmol/L      Comment: NO VISIBLE HEMOLYSIS    Chloride 101 98 - 111 mmol/L    CO2 25 22 - 32 mmol/L    Glucose, Bld 101 (H) 70 - 99 mg/dL      Comment: Glucose reference range applies only to samples taken after fasting for at least 8 hours.    BUN 19 6 - 20 mg/dL    Creatinine, Ser 0.96 (H) 0.44 - 1.00 mg/dL    Calcium 8.8 (L) 8.9 - 10.3 mg/dL    GFR, Estimated 34 (L) >60 mL/min      Comment: (NOTE) Calculated using the CKD-EPI Creatinine Equation (2021)      Anion gap 7 5 - 15      Comment: Performed at [facility], [address], [place], [place] [address]  Magnesium     Status: None    Collection Time: 04/07/21  1:55 PM  Result Value Ref Range    Magnesium 2.3 1.7 - 2.4 mg/dL      Comment: Performed at [facility], [address] [place], [place] [address]  Basic metabolic panel     Status: Abnormal    Collection Time: 04/08/21  2:23  AM  Result Value Ref Range    Sodium 135 135 - 145 mmol/L    Potassium 3.9 3.5 - 5.1 mmol/L    Chloride 100 98 - 111 mmol/L    CO2 26 22 - 32 mmol/L    Glucose, Bld 95 70 - 99 mg/dL      Comment: Glucose reference range applies only to samples taken after fasting for at least 8 hours.    BUN 21 (H) 6 - 20 mg/dL    Creatinine, Ser 5.46 (H) 0.44 - 1.00 mg/dL    Calcium 9.0 8.9 - 50.3 mg/dL    GFR, Estimated 33 (L) >60 mL/min  Comment: (NOTE) Calculated using the CKD-EPI Creatinine Equation (2021)      Anion gap 9 5 - 15      Comment: Performed at [facility], [place] [address] [place],  [address]      Imaging Results (Last 48 hours)  No results found.           Medical Problem List and Plan: 1.  Right side weakness secondary to left posterior limb internal capsule infarct secondary to small vessel disease             -patient may shower             -ELOS/Goals: 7-10 days/ mod I to supervision with PT, OT, SLP 2.  Antithrombotics: -DVT/anticoagulation:  Pharmaceutical: Lovenox             -antiplatelet therapy: Aspirin 81 mg daily and Plavix 75 mg day x3 weeks and aspirin alone 3. Pain Management: Tylenol as needed 4. Mood: Provide emotional support             -antipsychotic agents: N/A 5. Neuropsych: This patient is capable of making decisions on her own behalf. 6. Skin/Wound Care: Routine skin checks 7. Fluids/Electrolytes/Nutrition: Routine in and outs with follow-up chemistries 8.  Hypertension.  Norvasc 5 mg daily.  Monitor with increased mobility. Adjust regimen as needed. BP high today 9.  New findings hypothyroidism.  Continue Synthroid.  Follow-up thyroid panel 3 to 4 weeks 10.  Admission elevated creatinine 1.59.  Suspect CKD stage III.  Follow-up chemistries on admit             -encourage adquate fluids 11.  Moderate pericardial effusion identified on echocardiogram.  Follow-up echocardiogram 12.  Morbid obesity.  BMI 42.40.  Dietary  follow-up 13. Slow transit constipation:             -add scheduled miralax and senna-s             -sorbitol prn     Cathlyn Parsons, PA-C 04/08/2021    I have personally performed a face to face diagnostic evaluation of this patient and formulated the key components of the plan.  Additionally, I have personally reviewed laboratory data, imaging studies, as well as relevant notes and concur with the physician assistant's documentation above.  The patient's status has not changed from the original H&P.  Any changes in documentation from the acute care chart have been noted above.  Meredith Staggers, MD, Mellody Drown

## 2021-04-09 NOTE — Progress Notes (Signed)
Inpatient Rehabilitation Admission Medication Review by a Pharmacist  A complete drug regimen review was completed for this patient to identify any potential clinically significant medication issues.  High Risk Drug Classes Is patient taking? Indication by Medication  Antipsychotic No   Anticoagulant Yes Lovenox for VTE prophx  Antibiotic No   Opioid No   Antiplatelet Yes ASA/Plavix for stroke prophx.  Hypoglycemics/insulin No   Vasoactive Medication Yes Norvasc for BP  Chemotherapy No   Other No      Type of Medication Issue Identified Description of Issue Recommendation(s)  Drug Interaction(s) (clinically significant)     Duplicate Therapy     Allergy     No Medication Administration End Date  Plavix x 3 weeks Stop date added  Incorrect Dose     Additional Drug Therapy Needed     Significant med changes from prior encounter (inform family/care partners about these prior to discharge).    Other       Clinically significant medication issues were identified that warrant physician communication and completion of prescribed/recommended actions by midnight of the next day:  No  Time spent performing this drug regimen review (minutes):  15 min  Maryjean Corpening S. Merilynn Finland, PharmD, BCPS Clinical Staff Pharmacist [email or web address] Pasty Spillers 04/09/2021 8:48 AM

## 2021-04-09 NOTE — Evaluation (Signed)
Occupational Therapy Assessment and Plan  Patient Details  Name: Christine Fritz MRN: [id number] Date of Birth: [birth year]  OT Diagnosis: ataxia, hemiplegia affecting dominant side, and muscle weakness (generalized) Rehab Potential: Rehab Potential (ACUTE ONLY): Excellent ELOS: 12-14 days   Today's Date: 04/09/2021 OT Individual Time: 2458-0998 OT Individual Time Calculation (min): 74 min     Hospital Problem: Principal Problem:   Small vessel disease, cerebrovascular   Past Medical History: History reviewed. No pertinent past medical history. Past Surgical History: History reviewed. No pertinent surgical history.  Assessment & Plan Clinical Impression: Patient is a 60 y.o. year old female with recent admission to the hospital on 04/04/2021 with with transient right side weakness.  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  Remote lacunar infarct of the right basal ganglia/corona radiata.  MRI cervical spine left paracentral disc protrusion at C5-6 contacting and mildly flattening the left hemicord resulting in mild spinal stenosis.  MRA negative for large vessel occlusion.  Noted severe right A2 stenosis. Patient transferred to CIR on 04/08/2021 .    Patient currently requires mod with basic self-care skills secondary to muscle weakness and muscle paralysis, unbalanced muscle activation and decreased coordination, and decreased sitting balance, decreased standing balance, decreased postural control, hemiplegia, and decreased balance strategies.  Prior to hospitalization, patient could complete ADLs with independent .  Patient will benefit from skilled intervention to decrease level of assist with basic self-care skills, increase independence with basic self-care skills, and increase level of independence with iADL prior to discharge home with care partner.  Anticipate patient will require 24 hour supervision and follow  up outpatient.  OT - End of Session Activity Tolerance: Improving Endurance Deficit: Yes OT Assessment Rehab Potential (ACUTE ONLY): Excellent OT Patient demonstrates impairments in the following area(s): Balance;Endurance;Motor OT Basic ADL's Functional Problem(s): Grooming;Bathing;Dressing;Toileting OT Advanced ADL's Functional Problem(s): Simple Meal Preparation;Light Housekeeping OT Transfers Functional Problem(s): Tub/Shower;Toilet OT Additional Impairment(s): Fuctional Use of Upper Extremity OT Plan OT Intensity: Minimum of 1-2 x/day, 45 to 90 minutes OT Frequency: 5 out of 7 days OT Duration/Estimated Length of Stay: 12-14 days OT Treatment/Interventions: Medical illustrator training;Community reintegration;Discharge planning;Functional mobility training;Therapeutic Activities;UE/LE Coordination activities;Patient/family education;DME/adaptive equipment instruction;Cognitive remediation/compensation;Neuromuscular re-education;Self Care/advanced ADL retraining;Therapeutic Exercise;UE/LE Strength taining/ROM OT Self Feeding Anticipated Outcome(s): independent OT Basic Self-Care Anticipated Outcome(s): supervision OT Toileting Anticipated Outcome(s): supervision OT Bathroom Transfers Anticipated Outcome(s): supervision OT Recommendation Recommendations for Other Services: Therapeutic Recreation consult Therapeutic Recreation Interventions: Stress management Patient destination: Home Follow Up Recommendations: Outpatient OT Equipment Recommended: To be determined   OT Evaluation Precautions/Restrictions  Precautions Precautions: Fall Precaution Comments: R weakness, incoordination Restrictions Weight Bearing Restrictions: No  Pain Pain Assessment Pain Scale: Faces Pain Score: 0-No pain Home Living/Prior Functioning Home Living Available Help at Discharge: Available 24 hours/day (her mother can supervise while pt's spouse is at work) Type of Home: House Home Access:  Stairs to enter Technical brewer of Steps: 3 Entrance Stairs-Rails: Right, Left Home Layout: Able to live on main level with bedroom/bathroom, Two level Bathroom Shower/Tub: Tub/shower unit, Architectural technologist: Programmer, systems: Yes  Lives With: Spouse IADL History Homemaking Responsibilities: Yes Meal Prep Responsibility: Primary Laundry Responsibility: Primary Cleaning Responsibility: Primary Current License: Yes Occupation: Unemployed Prior Function Level of Independence: Independent with basic ADLs  Able to Take Stairs?: Yes Driving: Yes Vision Baseline Vision/History: 1 Wears glasses Ability to See in Adequate Light: 0 Adequate Patient Visual Report: No change from baseline  Vision Assessment?: Yes Ocular Range of Motion: Within Functional Limits Alignment/Gaze Preference: Within Defined Limits Tracking/Visual Pursuits: Able to track stimulus in all quads without difficulty Convergence: Within functional limits Visual Fields: No apparent deficits Perception  Perception: Within Functional Limits Praxis Praxis: Intact Cognition Overall Cognitive Status: Within Functional Limits for tasks assessed Arousal/Alertness: Awake/alert Orientation Level: Person;Place;Situation Person: Oriented Place: Oriented Situation: Oriented Year: 2022 Month: November Day of Week: Correct Memory: Appears intact Immediate Memory Recall: Sock;Blue;Bed Memory Recall Sock: Without Cue Memory Recall Blue: Without Cue Memory Recall Bed: Without Cue Attention: Sustained Sustained Attention: Appears intact Awareness: Appears intact Problem Solving: Appears intact Safety/Judgment: Appears intact Sensation Sensation Light Touch: Appears Intact Hot/Cold: Appears Intact Proprioception: Appears Intact Coordination Gross Motor Movements are Fluid and Coordinated: No Fine Motor Movements are Fluid and Coordinated: No Coordination and Movement Description: Decreasee  FM and gross motor coordination in the RUE.  Ataxia movement with finger to nose as well as decreased efficiency with opposition.  Uses the UE at a gross assist level with selfcare tasks. Motor  Motor Motor: Hemiplegia Motor - Skilled Clinical Observations: RUE and RLE hemiparesis  Trunk/Postural Assessment  Cervical Assessment Cervical Assessment: Exceptions to [facility] (slightly forward head) Thoracic Assessment Thoracic Assessment: Exceptions to [facility] (thoracic rounding) Lumbar Assessment Lumbar Assessment: Exceptions to [facility] (posterior pelvic tilt)  Balance Balance Balance Assessed: Yes Static Sitting Balance Static Sitting - Balance Support: Feet supported Static Sitting - Level of Assistance: 5: Stand by assistance Dynamic Sitting Balance Dynamic Sitting - Balance Support: During functional activity Dynamic Sitting - Level of Assistance: 4: Min assist Static Standing Balance Static Standing - Balance Support: During functional activity Static Standing - Level of Assistance: 4: Min assist Dynamic Standing Balance Dynamic Standing - Balance Support: No upper extremity supported Dynamic Standing - Level of Assistance: 3: Mod assist Extremity/Trunk Assessment RUE Assessment RUE Assessment: Exceptions to [facility] Passive Range of Motion (PROM) Comments: WFLS Active Range of Motion (AROM) Comments: AAROM WFLS General Strength Comments: 4/5 throughout.  Decreased FM and gross motor coordination for functional use overall.  Currently uses at a gross assist level for selfcare tasks. LUE Assessment LUE Assessment: Within Functional Limits Passive Range of Motion (PROM) Comments: WFLS Active Range of Motion (AROM) Comments: WFLS General Strength Comments: 5/5 throghout  Care Tool Care Tool Self Care Eating   Eating Assist Level: Set up assist    Oral Care    Oral Care Assist Level: Minimal Assistance - Patient > 75%    Bathing   Body parts bathed by patient: Right arm;Left  arm;Chest;Abdomen;Front perineal area;Buttocks;Right upper leg;Left upper leg;Right lower leg;Left lower leg;Face     Assist Level: Minimal Assistance - Patient > 75%    Upper Body Dressing(including orthotics)   What is the patient wearing?: Pull over shirt;Bra   Assist Level: Minimal Assistance - Patient > 75%    Lower Body Dressing (excluding footwear)   What is the patient wearing?: Pants;Underwear/pull up Assist for lower body dressing: Minimal Assistance - Patient > 75%    Putting on/Taking off footwear   What is the patient wearing?: Ted hose;Shoes Assist for footwear: Total Assistance - Patient < 25%       Care Tool Toileting Toileting activity   Assist for toileting: Moderate Assistance - Patient 50 - 74%     Care Tool Bed Mobility Roll left and right activity        Sit to lying activity        Lying to sitting on  side of bed activity   Lying to sitting on side of bed assist level: the ability to move from lying on the back to sitting on the side of the bed with no back support.: Contact Guard/Touching assist     Care Tool Transfers Sit to stand transfer   Sit to stand assist level: Minimal Assistance - Patient > 75%    Chair/bed transfer   Chair/bed transfer assist level: Minimal Assistance - Patient > 75%     Toilet transfer   Assist Level: Minimal Assistance - Patient > 75%     Care Tool Cognition  Expression of Ideas and Wants Expression of Ideas and Wants: 4. Without difficulty (complex and basic) - expresses complex messages without difficulty and with speech that is clear and easy to understand  Understanding Verbal and Non-Verbal Content Understanding Verbal and Non-Verbal Content: 4. Understands (complex and basic) - clear comprehension without cues or repetitions   Memory/Recall Ability Memory/Recall Ability : Current season;That he or she is in a hospital/hospital unit   Refer to Care Plan for [place] [facility] OT  Short Term Goal 1 (Week 1): Pt will complete UB dressing with supervision. OT Short Term Goal 2 (Week 1): Pt will complete LB dressing with min assist. OT Short Term Goal 3 (Week 1): Pt will complete stand pivot transfer to the 3:1 with use of the RW and min guard assist. OT Short Term Goal 4 (Week 1): Pt will use the RUE at a diminshed level during selfcare tasks with supervision.  Recommendations for other services: Therapeutic Recreation  Stress management   Skilled Therapeutic Intervention ADL ADL Eating: Supervision/safety Where Assessed-Eating: Chair Grooming: Minimal assistance Where Assessed-Grooming: Standing at sink Upper Body Bathing: Supervision/safety Where Assessed-Upper Body Bathing: Shower;Chair Lower Body Bathing: Minimal assistance Where Assessed-Lower Body Bathing: Shower;Chair Upper Body Dressing: Minimal assistance Where Assessed-Upper Body Dressing: Edge of bed Lower Body Dressing: Moderate assistance Where Assessed-Lower Body Dressing: Edge of bed Toileting: Moderate assistance Where Assessed-Toileting: Bedside Commode Toilet Transfer: Moderate assistance Toilet Transfer Method: Counselling psychologist: Radiographer, therapeutic: Not assessed Social research officer, government: Moderate assistance Social research officer, government Method: Heritage manager: Grab bars;Transfer tub bench Mobility  Bed Mobility Bed Mobility: Supine to Sit Supine to Sit: Minimal Assistance - Patient > 75% Transfers Sit to Stand: Minimal Assistance - Patient > 75% Stand to Sit: Minimal Assistance - Patient > 75%  Session Note:  Pt completed shower and dressing during session.  Mod assist for functional mobility to the bathroom with mod assist and no assistive device.  She was able to undress with min assist sit to stand in the shower and then completed shower at min assist level as well sit to stand.  She dried off and then completed transfer out to the bed  at [facility] assist level without use of an assistive device.  She was able to complete UB dressing at min assist level for donning her sports bra and pulling it down on the right side.  Supervision for donning pullover shirt.  She completed donning her underpants and pants at min assist level sit to stand with total assist for TEDs and max assist for slip on shoes.  Next, had her ambulate over to the sink for oral hygiene at min assist using the RW for support.  She demonstrated increased lean to the right in standing with right knee slowly buckling while standing.  She was able to incorporate the RUE  at a gross assist level for holding the toothpaste when opening it as well but brushed her teeth with the LUE.  Finished session with ambulation back to the recliner with the call button and phone in reach.  Safety alarm belt in place.  Discussed ELOS with pt as well as goals of supervision level.  Pt reports that she will have her mom to assist at discharge when spouse is at work if needed.     Discharge Criteria: Patient will be discharged from OT if patient refuses treatment 3 consecutive times without medical reason, if treatment goals not met, if there is a change in medical status, if patient makes no progress towards goals or if patient is discharged from hospital.  The above assessment, treatment plan, treatment alternatives and goals were discussed and mutually agreed upon: by patient  Altamese Deguire OTR/L 04/09/2021, 9:34 AM

## 2021-04-09 NOTE — IPOC Note (Signed)
Overall Plan of Care [facility]) Patient Details Name: MATEYA TORTI MRN: [id number] DOB: Feb 10, 1961  Admitting Diagnosis: Small vessel disease, cerebrovascular  Hospital Problems: Principal Problem:   Small vessel disease, cerebrovascular     Functional Problem List: Nursing Bowel, Medication Management, Pain, Endurance, Safety  PT Balance, Endurance, Motor, Safety  OT Balance, Endurance, Motor  SLP    TR         Basic ADL's: OT Grooming, Bathing, Dressing, Toileting     Advanced  ADL's: OT Simple Meal Preparation, Light Housekeeping     Transfers: PT Bed Mobility, Car, Bed to Chair  OT Tub/Shower, Toilet     Locomotion: PT Ambulation, Stairs     Additional Impairments: OT Fuctional Use of Upper Extremity  SLP None      TR      Anticipated Outcomes Item Anticipated Outcome  Self Feeding independent  Swallowing      Basic self-care  supervision  Toileting  supervision   Bathroom Transfers supervision  Bowel/Bladder  manage bowel w mod I assist  Transfers  mod I  Locomotion  supervision  Communication     Cognition     Pain  at or below level 4  Safety/Judgment  maintain w cuesreminders   Therapy Plan: PT Intensity: Minimum of 1-2 x/day ,45 to 90 minutes PT Frequency: 5 out of 7 days PT Duration Estimated Length of Stay: 10-14 days OT Intensity: Minimum of 1-2 x/day, 45 to 90 minutes OT Frequency: 5 out of 7 days OT Duration/Estimated Length of Stay: 12-14 days     Due to the current state of emergency, patients may not be receiving their 3-hours of Medicare-mandated therapy.   Team Interventions: Nursing Interventions Patient/Family Education, Bowel Management, Pain Management, Medication Management, Disease Management/Prevention, Discharge Planning  PT interventions Ambulation/gait training, Balance/vestibular training, Cognitive remediation/compensation, Community reintegration, DME/adaptive equipment instruction, Disease  management/prevention, Discharge planning, Functional mobility training, Neuromuscular re-education, Pain management, Patient/family education, Psychosocial support, Skin care/wound management, Splinting/orthotics, Stair training, Therapeutic Exercise, Therapeutic Activities, UE/LE Strength taining/ROM, UE/LE Coordination activities, Functional electrical stimulation, Visual/perceptual remediation/compensation, Wheelchair propulsion/positioning  OT Interventions Warden/ranger, Firefighter, Discharge planning, Functional mobility training, Therapeutic Activities, UE/LE Coordination activities, Patient/family education, Fish farm manager, Cognitive remediation/compensation, Neuromuscular re-education, Self Care/advanced ADL retraining, Therapeutic Exercise, UE/LE Strength taining/ROM  SLP Interventions    TR Interventions    SW/CM Interventions Discharge Planning, Psychosocial Support, Patient/Family Education   Barriers to Discharge MD  Medical stability  Nursing Decreased caregiver support, Home environment access/layout, Weight 2 level 3 ste bil rails w spouse who works; Arts administrator will coordinate care when spouse at work  PT Decreased caregiver support, [employer], Community education officer for SNF coverage, Lack of/limited family support    OT      SLP      SW Medication compliance, Community education officer for SNF coverage     Team Discharge Planning: Destination: PT-Home ,OT- Home , SLP-  Projected Follow-up: PT-Outpatient PT, 24 hour supervision/assistance, OT-  Outpatient OT, SLP-None Projected Equipment Needs: PT-To be determined, OT- To be determined, SLP-None recommended by SLP Equipment Details: PT- , OT-  Patient/family involved in discharge planning: PT- Patient,  OT-Patient, Family member/caregiver, SLP-Patient  MD ELOS: 7-10 days modI/S Medical Rehab Prognosis:  Excellent Assessment: Mrs. Mehlman is a 60 year old woman admitted to CIR with right side weakness  secondary to left posterior limb internal capsule infarct secondary to small vessel disease. Medications are being managed, and labs and vitals are being monitored regularly.     See  Team Conference Notes for weekly updates to the plan of care

## 2021-04-09 NOTE — Evaluation (Signed)
Physical Therapy Assessment and Plan  Patient Details  Name: Christine Fritz MRN: [id number] Date of Birth: 1961/02/18  PT Diagnosis: Abnormality of gait, Ataxia, Difficulty walking, Hemiparesis dominant, and Muscle weakness Rehab Potential: Good ELOS: 10-14 days   Today's Date: 04/09/2021 PT Individual Time: 0930-1029 PT Individual Time Calculation (min): 59 min    Hospital Problem: Principal Problem:   Small vessel disease, cerebrovascular   Past Medical History: History reviewed. No pertinent past medical history. Past Surgical History: History reviewed. No pertinent surgical history.  Assessment & Plan Clinical Impression: Patient is a 60 year old right-handed female with unremarkable past medical history except obesity with BMI 42.40 who has not been to a doctor for more than 20 years.  Per chart review patient lives with spouse.  Independent prior to admission.  Two-level home bed and bath main level with 3 steps to entry.  She does have good family support.  Presented 04/04/2021 with with transient right side weakness.  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  Remote lacunar infarct of the right basal ganglia/corona radiata.  MRI cervical spine left paracentral disc protrusion at C5-6 contacting and mildly flattening the left hemicord resulting in mild spinal stenosis.  MRA negative for large vessel occlusion.  Noted severe right A2 stenosis.  Admission chemistries unremarkable except TSH 219.795 creatinine 1.59, urine drug screen negative, LDL 259.  Echocardiogram ejection fraction of 60 to 65%, moderate pericardial effusion no wall motion abnormalities.  Placed on aspirin 81 mg daily and Plavix 75 mg day x3 weeks then aspirin alone for CVA prophylaxis.  Maintain on Lovenox for DVT prophylaxis.  She was placed on Synthroid for findings of elevated TSH.  Therapy evaluations completed due to patient's right side  weakness decreased functional mobility was admitted for a comprehensive rehab program.   Patient transferred to CIR on 04/08/2021 .   Patient currently requires min with mobility secondary to muscle weakness, decreased cardiorespiratoy endurance, unbalanced muscle activation, motor apraxia, and ataxia, and decreased sitting balance, decreased standing balance, hemiplegia, and decreased balance strategies.  Prior to hospitalization, patient was independent  with mobility and lived with Spouse in a House home.  Home access is 3Stairs to enter.  Patient will benefit from skilled PT intervention to maximize safe functional mobility, minimize fall risk, and decrease caregiver burden for planned discharge home with 24 hour supervision.  Anticipate patient will benefit from follow up OP at discharge.  PT - End of Session Activity Tolerance: Tolerates < 10 min activity, no significant change in vital signs Endurance Deficit: Yes PT Assessment Rehab Potential (ACUTE/IP ONLY): Good PT Barriers to Discharge: Decreased caregiver support;Weight;Insurance for SNF coverage;Lack of/limited family support PT Patient demonstrates impairments in the following area(s): Balance;Endurance;Motor;Safety PT Transfers Functional Problem(s): Bed Mobility;Car;Bed to Chair PT Locomotion Functional Problem(s): Ambulation;Stairs PT Plan PT Intensity: Minimum of 1-2 x/day ,45 to 90 minutes PT Frequency: 5 out of 7 days PT Duration Estimated Length of Stay: 7-10 days PT Treatment/Interventions: Ambulation/gait training;Balance/vestibular training;Cognitive remediation/compensation;Community reintegration;DME/adaptive equipment instruction;Disease management/prevention;Discharge planning;Functional mobility training;Neuromuscular re-education;Pain management;Patient/family education;Psychosocial support;Skin care/wound management;Splinting/orthotics;Stair training;Therapeutic Exercise;Therapeutic Activities;UE/LE Strength  taining/ROM;UE/LE Coordination activities;Functional electrical stimulation;Visual/perceptual remediation/compensation;Wheelchair propulsion/positioning PT Transfers Anticipated Outcome(s): mod I PT Locomotion Anticipated Outcome(s): supervision PT Recommendation Follow Up Recommendations: Outpatient PT;24 hour supervision/assistance Patient destination: Home Equipment Recommended: To be determined   PT Evaluation Precautions/Restrictions Precautions Precautions: None Precaution Comments: R weakness, incoordination Restrictions Weight Bearing Restrictions: No Pain Pain Assessment Pain Scale: 0-10 Pain  Score: 0-No pain Pain Interference Pain Interference Pain Effect on Sleep: 2. Occasionally Pain Interference with Therapy Activities: 1. Rarely or not at all Pain Interference with Day-to-Day Activities: 1. Rarely or not at all Home Living/Prior [place] Available Help at Discharge: Available 24 hours/day (her mother can supervise while pt's spouse is at work) Type of Home: House Home Access: Stairs to enter Technical brewer of Steps: 3 Entrance Stairs-Rails: Right;Left;Can reach both Home Layout: Able to live on main level with bedroom/bathroom;Two level (daugher's bedrooms are upstairs) Alternate Level Stairs-Number of Steps: full flight to 2nd floor (15 steps) Alternate Level Stairs-Rails: Right;Left;Can reach both Bathroom Shower/Tub: Tub/shower unit;Curtain Bathroom Toilet: Standard Bathroom Accessibility: Yes  Lives With: Spouse Prior Function Level of Independence: Independent with basic ADLs;Independent with transfers;Independent with gait  Able to Take Stairs?: Yes Driving: Yes Vision/Perception  Vision - History Ability to See in Adequate Light: 0 Adequate Vision - Assessment Ocular Range of Motion: Within Functional Limits Alignment/Gaze Preference: Within Defined Limits Tracking/Visual Pursuits: Able to track stimulus in all quads without  difficulty Convergence: Within functional limits Perception Perception: Within Functional Limits Praxis Praxis: Intact  Cognition Overall Cognitive Status: Within Functional Limits for tasks assessed Arousal/Alertness: Awake/alert Orientation Level: Oriented X4 Year: 2022 Month: November Day of Week: Correct Attention: Sustained Sustained Attention: Appears intact Memory: Appears intact Immediate Memory Recall: Sock;Blue;Bed Memory Recall Sock: Without Cue Memory Recall Blue: Without Cue Memory Recall Bed: Without Cue Awareness: Appears intact Problem Solving: Appears intact Safety/Judgment: Appears intact Sensation Sensation Light Touch: Appears Intact Hot/Cold: Appears Intact Proprioception: Appears Intact Coordination Gross Motor Movements are Fluid and Coordinated: No Fine Motor Movements are Fluid and Coordinated: No Coordination and Movement Description: Decreasee FM and gross motor coordination in the RUE.  Ataxia movement with finger to nose as well as decreased efficiency with opposition.  Uses the UE at a gross assist level with selfcare tasks. Motor  Motor Motor: Hemiplegia Motor - Skilled Clinical Observations: RUE and RLE hemiparesis   Trunk/Postural Assessment  Cervical Assessment Cervical Assessment: Exceptions to [facility] (forward head) Thoracic Assessment Thoracic Assessment: Exceptions to [facility] (rounded shulders) Lumbar Assessment Lumbar Assessment: Exceptions to [facility] (posterior pelvic tilt) Postural Control Postural Control: Within Functional Limits  Balance Balance Balance Assessed: Yes Static Sitting Balance Static Sitting - Balance Support: Feet supported Static Sitting - Level of Assistance: 5: Stand by assistance Dynamic Sitting Balance Dynamic Sitting - Balance Support: During functional activity Dynamic Sitting - Level of Assistance: 4: Min assist Static Standing Balance Static Standing - Balance Support: During functional activity Static  Standing - Level of Assistance: 4: Min assist Dynamic Standing Balance Dynamic Standing - Balance Support: No upper extremity supported Dynamic Standing - Level of Assistance: 3: Mod assist Extremity Assessment  RUE Assessment RUE Assessment: Exceptions to [facility] Passive Range of Motion (PROM) Comments: WFLS Active Range of Motion (AROM) Comments: AAROM WFLS General Strength Comments: 4/5 throughout.  Decreased FM and gross motor coordination for functional use overall.  Currently uses at a gross assist level for selfcare tasks. LUE Assessment LUE Assessment: Within Functional Limits Passive Range of Motion (PROM) Comments: WFLS Active Range of Motion (AROM) Comments: WFLS General Strength Comments: 5/5 throghout RLE Assessment RLE Assessment: Exceptions to [facility] General Strength Comments: Hip flex 2+/5, knee ext 4-/5, ankle DF 3-/5 LLE Assessment LLE Assessment: Within Functional Limits  Care Tool Care Tool Bed Mobility Roll left and right activity   Roll left and right assist level: Minimal Assistance - Patient > 75%  Sit to lying activity   Sit to lying assist level: Minimal Assistance - Patient > 75%    Lying to sitting on side of bed activity   Lying to sitting on side of bed assist level: the ability to move from lying on the back to sitting on the side of the bed with no back support.: Minimal Assistance - Patient > 75%     Care Tool Transfers Sit to stand transfer   Sit to stand assist level: Minimal Assistance - Patient > 75%    Chair/bed transfer   Chair/bed transfer assist level: Minimal Assistance - Patient > 75%     Toilet transfer   Assist Level: Minimal Assistance - Patient > 75%    Car transfer   Car transfer assist level: Minimal Assistance - Patient > 75%      Care Tool Locomotion Ambulation   Assist level: Minimal Assistance - Patient > 75% Assistive device: Walker-rolling Max distance: 78f  Walk 10 feet activity   Assist level: Minimal Assistance  - Patient > 75% Assistive device: Walker-rolling   Walk 50 feet with 2 turns activity   Assist level: Minimal Assistance - Patient > 75% Assistive device: Walker-rolling  Walk 150 feet activity Walk 150 feet activity did not occur: Safety/medical concerns (fatigue)      Walk 10 feet on uneven surfaces activity Walk 10 feet on uneven surfaces activity did not occur: Safety/medical concerns      Stairs Stair activity did not occur: Safety/medical concerns        Walk up/down 1 step activity Walk up/down 1 step or curb (drop down) activity did not occur: Safety/medical concerns      Walk up/down 4 steps activity Walk up/down 4 steps activity did not occur: Safety/medical concerns      Walk up/down 12 steps activity Walk up/down 12 steps activity did not occur: Safety/medical concerns      Pick up small objects from floor Pick up small object from the floor (from standing position) activity did not occur: Safety/medical concerns      Wheelchair Is the patient using a wheelchair?: Yes Type of Wheelchair: Manual   Wheelchair assist level: Dependent - Patient 0% Max wheelchair distance: 150'  Wheel 50 feet with 2 turns activity   Assist Level: Dependent - Patient 0%  Wheel 150 feet activity   Assist Level: Dependent - Patient 0%    Refer to Care Plan for Long Term Goals  SHORT TERM GOAL WEEK 1 PT Short Term Goal 1 (Week 1): pt will complete bed mobility with CGA PT Short Term Goal 2 (Week 1): Pt will complete bed<>chair transfers with CGA and LRAD PT Short Term Goal 3 (Week 1): Pt will ambulate 1543fwith CGA and LRAD PT Short Term Goal 4 (Week 1): Pt will navigate up/down x4 steps with minA and 2 hand rails  Recommendations for other services: None   Skilled Therapeutic Intervention Mobility Bed Mobility Bed Mobility: Supine to Sit Supine to Sit: Minimal Assistance - Patient > 75% Transfers Transfers: Sit to Stand;Stand to Sit;Stand Pivot Transfers Sit to Stand:  Minimal Assistance - Patient > 75% Stand to Sit: Minimal Assistance - Patient > 75% Stand Pivot Transfers: Minimal Assistance - Patient > 75% Stand Pivot Transfer Details: Verbal cues for technique;Verbal cues for sequencing;Verbal cues for precautions/safety;Verbal cues for safe use of DME/AE;Verbal cues for gait pattern;Tactile cues for weight shifting Transfer (Assistive device): None Locomotion  Gait Ambulation: Yes Gait Assistance: Minimal Assistance - Patient >  75% Gait Distance (Feet): 90 Feet Assistive device: Rolling walker Gait Assistance Details: Verbal cues for technique;Verbal cues for sequencing;Verbal cues for precautions/safety;Verbal cues for gait pattern;Verbal cues for safe use of DME/AE;Tactile cues for weight shifting;Visual cues for safe use of DME/AE Gait Gait: Yes Gait Pattern: Impaired Gait Pattern: Step-to pattern;Decreased step length - right;Decreased stance time - right;Decreased hip/knee flexion - right;Decreased dorsiflexion - right;Decreased weight shift to right;Poor foot clearance - right;Trunk flexed;Narrow base of support Gait velocity: 0.11 m/s Stairs / Additional Locomotion Stairs: No Wheelchair Mobility Wheelchair Mobility: No  Instructed pt in results of PT evaluation as detailed above, PT POC, rehab potential, rehab goals, and discharge recommendations. Additionally discussed CIR's policies regarding fall safety and use of chair alarm and/or quick release belt. Pt verbalized understanding and in agreement. Will update pt's family members as they become available.    Discharge Criteria: Patient will be discharged from PT if patient refuses treatment 3 consecutive times without medical reason, if treatment goals not met, if there is a change in medical status, if patient makes no progress towards goals or if patient is discharged from hospital.  The above assessment, treatment plan, treatment alternatives and goals were discussed and mutually agreed  upon: by patient  Alger Simons PT, DPT 04/09/2021, 10:31 AM

## 2021-04-09 NOTE — Progress Notes (Signed)
[facility]patient [facility] Individual Statement of Services  Patient Name:  Christine Fritz  Date:  04/09/2021  Welcome to the [facility]patient Rehabilitation [facility].  Our goal is to provide you with an individualized program based on your diagnosis and situation, designed to meet your specific needs.  With this comprehensive rehabilitation program, you will be expected to participate in at least 3 hours of rehabilitation therapies Monday-Friday, with modified therapy programming on the weekends.  Your rehabilitation program will include the following services:  Physical Therapy (PT), Occupational Therapy (OT), Speech Therapy (ST), 24 hour per day rehabilitation nursing, Therapeutic Recreaction (TR), Neuropsychology, Care Coordinator, Rehabilitation Medicine, Nutrition Services, and Pharmacy Services  Weekly team conferences will be held on Wednesday to discuss your progress.  Your Inpatient Rehabilitation Care Coordinator will talk with you frequently to get your input and to update you on team discussions.  Team conferences with you and your family in attendance may also be held.  Expected length of stay: 10-14 days  Overall anticipated outcome: supervision level  Depending on your progress and recovery, your program may change. Your Inpatient Rehabilitation Care Coordinator will coordinate services and will keep you informed of any changes. Your Inpatient Rehabilitation Care Coordinator's name and contact numbers are listed  below.  The following services may also be recommended but are not provided by the Inpatient Rehabilitation Center:  Driving Evaluations Home Health Rehabiltiation Services Outpatient Rehabilitation Services    Arrangements will be made to provide these services after discharge if needed.  Arrangements include referral to agencies that provide these services.  Your insurance has been verified to be:  [employer] Your primary doctor is:  Designer, multimedia  Pertinent  information will be shared with your doctor and your insurance company.  Inpatient Rehabilitation Care Coordinator:  Dossie Der, [place] [phone] or Luna [place]  Information discussed with and copy given to patient by: Lucy Chris, 04/09/2021, 12:34 PM

## 2021-04-09 NOTE — Evaluation (Signed)
Speech Language Pathology Assessment and Plan  Patient Details  Name: Christine Fritz MRN: [id number] Date of Birth: [birth year]  SLP Diagnosis: Cognitive Impairments  Rehab Potential:  N/A for speech ELOS:   N/A for speech   Today's Date: 04/09/2021 SLP Individual Time: 0940-7680 SLP Individual Time Calculation (min): 50 min   Hospital Problem: Principal Problem:   Small vessel disease, cerebrovascular  Past Medical History: History reviewed. No pertinent past medical history. Past Surgical History: History reviewed. No pertinent surgical history.  Assessment / Plan / Recommendation   Patient is a 60 y.o. year old female with recent admission to the hospital on 04/04/2021 with with transient right side weakness.  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  Remote lacunar infarct of the right basal ganglia/corona radiata.  MRI cervical spine left paracentral disc protrusion at C5-6 contacting and mildly flattening the left hemicord resulting in mild spinal stenosis.  MRA negative for large vessel occlusion.  Noted severe right A2 stenosis. Patient transferred to CIR on 04/08/2021 .    Clinical Impression Patient assessed for cognitive function via portions of ALFA and Cognistat. She was oriented x4, demonstrated appropriate awareness, reasoning, problem solving, memory and insight. She completed Daily Math Problems from ALFA and received 100% accuracy. She was 100% accurate for Reading instructions. Of note, SLP assessed patient on acute with SLUMS assessment and she was in average range at 28 of 30 (27 and higher WNL). Today's assessment did not reveal any higher level cognitive-linguistic deficits and patient appears to be functioning at baseline. Patient denies any speech, language or cognitive impairments since CVA. SLP provided patient with education of stroke, reviewing portions of stroke education booklet and  encouraging patient to ask questions of MD, therapists, etc.  Skilled Therapeutic Interventions          ALFA, Cognistat  SLP Assessment  Patient does not need any further Speech Lanaguage Pathology Services    Recommendations  Follow up Recommendations: None Equipment Recommended: None recommended by SLP    SLP Frequency   N/A  SLP Duration  SLP Intensity  SLP Treatment/Interventions  N/A   N/A    N/A   Pain Pain Assessment Pain Scale: 0-10 Pain Score: 0-No pain  Prior Functioning Cognitive/Linguistic Baseline: Within functional limits Type of Home: House  Lives With: Spouse Available Help at Discharge: Available 24 hours/day Education: associates degree in accounting Vocation: Unemployed  SLP Evaluation Cognition Overall Cognitive Status: Within Functional Limits for tasks assessed Arousal/Alertness: Awake/alert Year: 2022 Month: November Day of Week: Correct Awareness: Appears intact Problem Solving: Appears intact Safety/Judgment: Appears intact  Comprehension Auditory Comprehension Overall Auditory Comprehension: Appears within functional limits for tasks assessed Expression Expression Primary Mode of Expression: Verbal Verbal Expression Overall Verbal Expression: Appears within functional limits for tasks assessed Oral Motor Oral Motor/Sensory Function Overall Oral Motor/Sensory Function: Within functional limits Motor Speech Overall Motor Speech: Appears within functional limits for tasks assessed Articulation: Within functional limitis Intelligibility: Intelligible  Care Tool Care Tool Cognition Ability to hear (with hearing aid or hearing appliances if normally used Ability to hear (with hearing aid or hearing appliances if normally used): 0. Adequate - no difficulty in normal conservation, social interaction, listening to TV   Expression of Ideas and Wants Expression of Ideas and Wants: 4. Without difficulty (complex and basic) - expresses  complex messages without difficulty and with speech that is clear and easy to understand   Understanding Verbal  and Non-Verbal Content Understanding Verbal and Non-Verbal Content: 4. Understands (complex and basic) - clear comprehension without cues or repetitions  Memory/Recall Ability Memory/Recall Ability : Current season;That he or she is in a hospital/hospital unit    Intelligibility: Intelligible  Bedside Swallowing Assessment General    Oral Care Assessment Does patient have any of the following "high(er) risk" factors?: None of the above Does patient have any of the following "at risk" factors?: None of the above Patient is LOW RISK: Follow universal precautions (see row information)     Short Term Goals: No short term goals set  Refer to Care Plan for Long Term Goals  Recommendations for other services: None   Discharge Criteria: Patient will be discharged from SLP if patient refuses treatment 3 consecutive times without medical reason, if treatment goals not met, if there is a change in medical status, if patient makes no progress towards goals or if patient is discharged from hospital.  The above assessment, treatment plan, treatment alternatives and goals were discussed and mutually agreed upon: by patient  Sonia Baller, MA, CCC-SLP Speech Therapy

## 2021-04-09 NOTE — Discharge Instructions (Addendum)
Inpatient Rehab Discharge Instructions  Christine Fritz Discharge date and time: No discharge date for patient encounter.   Activities/Precautions/ Functional Status: Activity: activity as tolerated Diet: Regular Wound Care: Routine skin checks Functional status:  ___ No restrictions     ___ Walk up steps independently ___ 24/7 supervision/assistance   ___ Walk up steps with assistance ___ Intermittent supervision/assistance  ___ Bathe/dress independently ___ Walk with walker     __x_ Bathe/dress with assistance ___ Walk Independently    ___ Shower independently ___ Walk with assistance    ___ Shower with assistance ___ No alcohol     ___ Return to work/school ________  Special Instructions: No driving smoking or alcohol  Continue aspirin 81 mg daily and Plavix 75 mg daily x3 weeks then aspirin alone    COMMUNITY REFERRALS UPON DISCHARGE:    Outpatient: PT & OT             Agency:[facility]  [address] [place] [address] Phone:[phone]              Appointment Date/Time: WILL CALL YOU TO SET UP APPOINTMENTS  Medical Equipment/Items Ordered:3 IN 1 & ROLLING WALKER                                                 Agency/Supplier: [facility]  [phone]   My questions have been answered and I understand these instructions. I will adhere to these goals and the provided educational materials after my discharge from the hospital.  Patient/Caregiver Signature _______________________________ Date __________  Clinician Signature _______________________________________ Date __________  Please bring this form and your medication list with you to all your follow-up doctor's appointments.  STROKE/TIA DISCHARGE INSTRUCTIONS SMOKING Cigarette smoking nearly doubles your risk of having a stroke & is the single most alterable risk factor  If you smoke or have smoked in the last 12 months, you are advised to quit smoking for your health. Most of the  excess cardiovascular risk related to smoking disappears within a year of stopping. Ask you doctor about anti-smoking medications [place] Quit Line: 1-800-QUIT NOW Free Smoking Cessation Classes [phone]  CHOLESTEROL Know your levels; limit fat & cholesterol in your diet  Lipid Panel     Component Value Date/Time   CHOL 353 (H) 04/05/2021 0457   TRIG 227 (H) 04/05/2021 0457   HDL 49 04/05/2021 0457   CHOLHDL 7.2 04/05/2021 0457   VLDL 45 (H) 04/05/2021 0457   LDLCALC 259 (H) 04/05/2021 0457     Many patients benefit from treatment even if their cholesterol is at goal. Goal: Total Cholesterol (CHOL) less than 160 Goal:  Triglycerides (TRIG) less than 150 Goal:  HDL greater than 40 Goal:  LDL (LDLCALC) less than 100   BLOOD PRESSURE American Stroke Association blood pressure target is less that 120/80 mm/Hg  Your discharge blood pressure is:  BP: (!) 151/88 Monitor your blood pressure Limit your salt and alcohol intake Many individuals will require more than one medication for high blood pressure  DIABETES (A1c is a blood sugar average for last 3 months) Goal HGBA1c is under 7% (HBGA1c is blood sugar average for last 3 months)  Diabetes:    Lab Results  Component Value Date   HGBA1C 5.5 04/05/2021    Your HGBA1c can be lowered with medications, healthy diet,  and exercise. Check your blood sugar as directed by your physician Call your physician if you experience unexplained or low blood sugars.  PHYSICAL ACTIVITY/REHABILITATION Goal is 30 minutes at least 4 days per week  Activity: Increase activity slowly, Therapies: Physical Therapy: Home Health Return to work:  Activity decreases your risk of heart attack and stroke and makes your heart stronger.  It helps control your weight and blood pressure; helps you relax and can improve your mood. Participate in a regular exercise program. Talk with your doctor about the best form of exercise for you (dancing, walking, swimming,  cycling).  DIET/WEIGHT Goal is to maintain a healthy weight  Your discharge diet is:  Diet Order             Diet Carb Modified Fluid consistency: Thin; Room service appropriate? Yes  Diet effective now                   liquids Your height is:    Your current weight is:   Your Body Mass Index (BMI) is:    Following the type of diet specifically designed for you will help prevent another stroke. Your goal weight range is:   Your goal Body Mass Index (BMI) is 19-24. Healthy food habits can help reduce 3 risk factors for stroke:  High cholesterol, hypertension, and excess weight.  RESOURCES Stroke/Support Group:  Call [phone]   STROKE EDUCATION PROVIDED/REVIEWED AND GIVEN TO PATIENT Stroke warning signs and symptoms How to activate emergency medical system (call 911). Medications prescribed at discharge. Need for follow-up after discharge. Personal risk factors for stroke. Pneumonia vaccine given: No Flu vaccine given: No My questions have been answered, the writing is legible, and I understand these instructions.  I will adhere to these goals & educational materials that have been provided to me after my discharge from the hospital.

## 2021-04-10 DIAGNOSIS — I10 Essential (primary) hypertension: Secondary | ICD-10-CM | POA: Diagnosis not present

## 2021-04-10 DIAGNOSIS — N1831 Chronic kidney disease, stage 3a: Secondary | ICD-10-CM

## 2021-04-10 DIAGNOSIS — K5901 Slow transit constipation: Secondary | ICD-10-CM | POA: Diagnosis not present

## 2021-04-10 DIAGNOSIS — I679 Cerebrovascular disease, unspecified: Secondary | ICD-10-CM | POA: Diagnosis not present

## 2021-04-10 NOTE — Progress Notes (Signed)
[facility] PHYSICAL MEDICINE & REHABILITATION PROGRESS NOTE  Subjective/Complaints: Patient seen sitting up in bed this morning.  She states she slept well overnight.  She states she had a good first day of therapy yesterday.  ROS: Denies CP, SOB, N/V/D  Objective: Vital Signs: Blood pressure (!) 138/103, pulse 75, temperature (!) 97.4 F (36.3 C), resp. rate 16, SpO2 96 %. ECHOCARDIOGRAM LIMITED  Result Date: 04/08/2021    ECHOCARDIOGRAM LIMITED REPORT   Patient Name:   Christine Fritz Date of Exam: 04/08/2021 Medical Rec #:  [id number]     Height:       64.0 in Accession #:    [id number]    Weight:       247.0 lb Date of Birth:  [birth year]      BSA:          2.140 m Patient Age:    60 years      BP:           157/97 mmHg Patient Gender: F             HR:           77 bpm. Exam Location:  Inpatient Procedure: Limited Echo Indications:    Pericardial Effusion  History:        Patient has prior history of Echocardiogram examinations, most                 recent 04/05/2021. Risk Factors:Hypertension.  Sonographer:    Alvera Novel Referring Phys: [id number] RAMESH KC IMPRESSIONS  1. Left ventricular ejection fraction, by estimation, is 60 to 65%. The left ventricle has normal function. The left ventricle has no regional wall motion abnormalities. There is severe concentric left ventricular hypertrophy.  2. Moderate pericardial effusion. The pericardial effusion is anterior to the right ventricle. Comparison(s): No significant change from prior study. Prior images reviewed side by side. Conclusion(s)/Recommendation(s): Images reviewed side by side, unchanged from prior. There is a moderate pericardial effusion predominantly located anterior to the right ventricle. While there is some respiratory variation of the mitral inflow, there is no RV diastolic collapse. RA not well seen. No definitive echo evidence of tamponade, recommend clinical correlation. FINDINGS  Left Ventricle: Left ventricular ejection  fraction, by estimation, is 60 to 65%. The left ventricle has normal function. The left ventricle has no regional wall motion abnormalities. There is severe concentric left ventricular hypertrophy. Pericardium: A moderately sized pericardial effusion is present. The pericardial effusion is anterior to the right ventricle. Presence of epicardial fat layer. LEFT VENTRICLE PLAX 2D LVIDd:         3.70 cm LVIDs:         2.60 cm LV PW:         1.90 cm LV IVS:        1.80 cm  LV Volumes (MOD) LV vol d, MOD A2C: 46.3 ml LV vol d, MOD A4C: 71.0 ml LV vol s, MOD A2C: 19.0 ml LV vol s, MOD A4C: 31.8 ml LV SV MOD A2C:     27.3 ml LV SV MOD A4C:     71.0 ml LV SV MOD BP:      33.0 ml Jodelle Red MD Electronically signed by Jodelle Red MD Signature Date/Time: 04/08/2021/12:43:11 PM    Final    Recent Labs    04/09/21 0552  WBC 7.2  HGB 12.6  HCT 38.5  PLT 221   Recent Labs    04/08/21 0223 04/09/21 0552  NA 135  137  K 3.9 3.8  CL 100 103  CO2 26 26  GLUCOSE 95 99  BUN 21* 22*  CREATININE 1.77* 1.71*  CALCIUM 9.0 9.3    Intake/Output Summary (Last 24 hours) at 04/10/2021 0940 Last data filed at 04/10/2021 0900 Gross per 24 hour  Intake 660 ml  Output --  Net 660 ml        Physical Exam: BP (!) 138/103 (BP Location: Right Arm)   Pulse 75   Temp (!) 97.4 F (36.3 C)   Resp 16   SpO2 96%  Constitutional: No distress . Vital signs reviewed.  Morbidly obese. HENT: Normocephalic.  Atraumatic. Eyes: EOMI. No discharge. Cardiovascular: No JVD.  RRR. Respiratory: Normal effort.  No stridor.  Bilateral clear to auscultation. GI: Non-distended.  BS +. Skin: Warm and dry.  Intact. Psych: Normal mood.  Normal behavior. Musc: No edema in extremities.  No tenderness in extremities. Neuro: Alert Motor: RUE 4+/5. RLE 4+/5, stable. LUE and LLE 5/5  Assessment/Plan: 1. Functional deficits which require 3+ hours per day of interdisciplinary therapy in a comprehensive inpatient  rehab setting. Physiatrist is providing close team supervision and 24 hour management of active medical problems listed below. Physiatrist and rehab team continue to assess barriers to discharge/monitor patient progress toward functional and medical goals   Care Tool:  Bathing    Body parts bathed by patient: Right arm, Left arm, Chest, Abdomen, Front perineal area, Buttocks, Right upper leg, Left upper leg, Right lower leg, Left lower leg, Face         Bathing assist Assist Level: Minimal Assistance - Patient > 75%     Upper Body Dressing/Undressing Upper body dressing   What is the patient wearing?: Pull over shirt, Bra    Upper body assist Assist Level: Minimal Assistance - Patient > 75%    Lower Body Dressing/Undressing Lower body dressing      What is the patient wearing?: Pants, Underwear/pull up     Lower body assist Assist for lower body dressing: Minimal Assistance - Patient > 75%     Toileting Toileting    Toileting assist Assist for toileting: Moderate Assistance - Patient 50 - 74%     Transfers Chair/bed transfer  Transfers assist     Chair/bed transfer assist level: Contact Guard/Touching assist     Locomotion Ambulation   Ambulation assist      Assist level: Contact Guard/Touching assist Assistive device: Walker-rolling Max distance: 50   Walk 10 feet activity   Assist     Assist level: Contact Guard/Touching assist Assistive device: Walker-rolling   Walk 50 feet activity   Assist    Assist level: Contact Guard/Touching assist Assistive device: Walker-rolling    Walk 150 feet activity   Assist Walk 150 feet activity did not occur: Safety/medical concerns (fatigue)         Walk 10 feet on uneven surface  activity   Assist Walk 10 feet on uneven surfaces activity did not occur: Safety/medical concerns         Wheelchair     Assist Is the patient using a wheelchair?: Yes Type of Wheelchair: Manual     Wheelchair assist level: Minimal Assistance - Patient > 75% Max wheelchair distance: 50    Wheelchair 50 feet with 2 turns activity    Assist        Assist Level: Minimal Assistance - Patient > 75%   Wheelchair 150 feet activity     Assist  Assist Level: Dependent - Patient 0%    Medical Problem List and Plan: 1.  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  Antithrombotics: -DVT/anticoagulation:  Pharmaceutical: Lovenox             -antiplatelet therapy: Aspirin 81 mg daily and Plavix 75 mg day x3 weeks and aspirin alone 3. Pain Management: Tylenol as needed 4. Mood: Provide emotional support             -antipsychotic agents: N/A 5. Neuropsych: This patient is capable of making decisions on her own behalf. 6. Skin/Wound Care: Routine skin checks 7. Fluids/Electrolytes/Nutrition: Routine in and outs 8.  Hypertension.  Norvasc 5 mg daily.  Monitor with increased mobility. Adjust regimen as needed  Diastolic elevated overnight on 11/18, monitor for trend 9.  New findings hypothyroidism.  Continue Synthroid.  Follow-up thyroid panel 3 to 4 weeks 10.  Admission elevated creatinine 1.59.  Suspect CKD stage III.    Creatinine 1.71 on 11/18             -encourage adquate fluids 11.  Moderate pericardial effusion identified on echocardiogram.  Follow-up echocardiogram 12.  Morbid obesity.  BMI 42.40.  Dietary follow-up 13. Slow transit constipation:             -add scheduled miralax and senna-s             -sorbitol prn   Improving  LOS: 2 days A FACE TO FACE EVALUATION WAS PERFORMED  Katiria Calame Lorie Phenix 04/10/2021, 9:40 AM

## 2021-04-10 NOTE — Progress Notes (Signed)
Occupational Therapy Session Note  Patient Details  Name: Christine Fritz MRN: [id number] Date of Birth: Aug 21, 1960  Today's Date: 04/10/2021 OT Individual Time: 1410-1540 OT Individual Time Calculation (min): 90 min    Short Term Goals: Week 1:  OT Short Term Goal 1 (Week 1): Pt will complete UB dressing with supervision. OT Short Term Goal 2 (Week 1): Pt will complete LB dressing with min assist. OT Short Term Goal 3 (Week 1): Pt will complete stand pivot transfer to the 3:1 with use of the RW and min guard assist. OT Short Term Goal 4 (Week 1): Pt will use the RUE at a diminshed level during selfcare tasks with supervision.  Skilled Therapeutic Interventions/Progress Updates: focus this session functional mobility, self care including functional mobility and required transitional movement.  R UE neuro re education, motor control and right digital manipulation and dexterity.     Patient completed toilet transfer recliner chair to elevated seat via walker and CGA.     Noted was with dynamic standing tasks, patient right upper leg tended muscles tended to decrease, reuslting in small knee buckling but patient able to independently re engage her muscles right afterwards.   Patient will benefit from continued services utlizing transitional movement and dynamic standing that will increased L LE strength expecially in upper leg in order to increase safety and independence for funtional tasks such as self care, baking and meal prep.      Patient she loves to cook and cater special events and would like to incorporate into treatment sessions.   As well, she feels standing during will help increase her right L LE strength.     Right digital coordination work and dexterity was performed this session as well as crossing midline task.   Patient right upper extremity coordination and motor control work was heavy today as patient stated she would like more accuracy with that extremity.   This clinician placed  several right hand/digital fine motor, coordination and dexterity home program items in her room to work on when not in therapy as her endurance allows.  She was left seated with alarm in her recliner chair.   Continue OT POC     Therapy Documentation Precautions:  Precautions Precautions: None Precaution Comments: R weakness, incoordination Restrictions Weight Bearing Restrictions: No  Pain:denied     Therapy/Group: Individual Therapy  Bud Face [facility] 04/10/2021, 4:40 PM

## 2021-04-10 NOTE — Progress Notes (Signed)
Physical Therapy Session Note  Patient Details  Name: Christine Fritz MRN: [id number] Date of Birth: March 16, 1961  Today's Date: 04/10/2021 PT Individual Time: 5573-2202; 5427-0623 PT Individual Time Calculation (min): 75 min , 30 min  Short Term Goals: Week 1:  PT Short Term Goal 1 (Week 1): pt will complete bed mobility with CGA PT Short Term Goal 2 (Week 1): Pt will complete bed<>chair transfers with CGA and LRAD PT Short Term Goal 3 (Week 1): Pt will ambulate 119ft with CGA and LRAD PT Short Term Goal 4 (Week 1): Pt will navigate up/down x4 steps with minA and 2 hand rails  Skilled Therapeutic Interventions/Progress Updates:   Tx 1:  Pt exiting BR with RN, using RW on flat tile, CGA.  She denied pain.  Ambulatory transfer iwht CGa, RW.  Stand pivot transfer wc to mat with CGa, to R.  Patient demonstrates increased fall risk as noted by score of   21/56 on Berg Balance Scale.  (<36= high risk for falls, close to 100%; 37-45 significant >80%; 46-51 moderate >50%; 52-55 lower >25%).  PT discussed pt's risk for falls and need to use RW at this time.  She voiced understanding.  neuromuscular re-education via forced use, and assistance for propelling wc with bil UEs, x 50' with min assist.  Seated in wc, 10 glut sets with upright trunk, without use of UEs.  Use of Kinetron in sitting in the wc, resisttance 40 cm/sec, x 3 minutes, without use of UEs.  Use of RLE only, against resistance from PT on other footplate, x 25 cycles, with multimodal cues to push fully, with force throughout range.  Gait training iwht RW, level tile x 50' iwht turns, CGA.  ACE on R knee for pt confidence as she feels it will buckle.    At end of session, pt seated in recliner with feet elevated, needs at hand, and seat pad alarm set.  Tx :  Pt seated in recliner.  She declined pain.  PT attempted to don pt's TEDS, but they are too small; she needs a size L; PT will notify next therapist of this.  Gait training x  10' on level tile with RW to foot of bed.  neuromuscular re-education via demo, multimodal cues for standing with bil UEs on footboard, x 15 each: mini squats, calf raises.  Seated in wc: 15 x 1:R long arc quad isometric hold at end range.  Mod cues for feet shoulder width apart, R knee control, and upright posture. Gait to return to recliner, as above.  Mod cues for use of bil hands for sit>< stand to RW.   At end of session, pt seated in recliner with LEs elevated, needs at hand and seat pad alarm set.  Therapy Documentation Precautions:  Precautions Precautions: None Precaution Comments: R weakness, incoordination Restrictions Weight Bearing Restrictions: No   Balance: Standardized Balance Assessment Standardized Balance Assessment: Berg Balance Test Berg Balance Test Sit to Stand: Able to stand without using hands and stabilize independently Standing Unsupported: Able to stand 2 minutes with supervision Sitting with Back Unsupported but Feet Supported on Floor or Stool: Able to sit 2 minutes under supervision Stand to Sit: Sits safely with minimal use of hands Transfers: Able to transfer safely, definite need of hands Standing Unsupported with Eyes Closed: Able to stand 10 seconds with supervision Standing Ubsupported with Feet Together: Needs help to attain position but able to stand for 30 seconds with feet together From Standing, Reach Forward with  Outstretched Arm: Loses balance while trying/requires external support From Standing Position, Pick up Object from Floor: Unable to try/needs assist to keep balance From Standing Position, Turn to Look Behind Over each Shoulder: Needs assist to keep from losing balance and falling Turn 360 Degrees: Needs assistance while turning Standing Unsupported, Alternately Place Feet on Step/Stool: Needs assistance to keep from falling or unable to try Standing Unsupported, One Foot in Front: Loses balance while stepping or standing Standing on  One Leg: Unable to try or needs assist to prevent fall Total Score: 21      Therapy/Group: Individual Therapy  Kaytlen Lightsey 04/10/2021, 11:58 AM

## 2021-04-11 NOTE — Progress Notes (Signed)
Occupational Therapy Session Note  Patient Details  Name: Christine Fritz MRN: [id number] Date of Birth: Oct 16, 1960  Today's Date: 04/11/2021 OT Individual Time: 0820-0900 OT Individual Time Calculation (min): 40 min    Short Term Goals: Week 1:  OT Short Term Goal 1 (Week 1): Pt will complete UB dressing with supervision. OT Short Term Goal 2 (Week 1): Pt will complete LB dressing with min assist. OT Short Term Goal 3 (Week 1): Pt will complete stand pivot transfer to the 3:1 with use of the RW and min guard assist. OT Short Term Goal 4 (Week 1): Pt will use the RUE at a diminshed level during selfcare tasks with supervision.  Skilled Therapeutic Interventions/Progress Updates:    OT session focused on functional mobility and strengthening. Pt received supine in bed agreeable to therapy. Pt ambulated bed<>toilet with RW and CGA. Pt completed toileting with CGA for standing balance. Pt ambulated in room to retrieve shirt then donned in sitting with S. Completed seated leg lifts 3x 10 each LE and standing marches 3x 10 reps with mild buckling of R knee noted, however pt able to correct.   Therapy Documentation Precautions:  Precautions Precautions: None Precaution Comments: R weakness, incoordination Restrictions Weight Bearing Restrictions: No General:   Vital Signs: Therapy Vitals Pulse Rate: 79 BP: (!) 164/87 Oxygen Therapy SpO2: 95 % Pain:   ADL: ADL Eating: Supervision/safety Where Assessed-Eating: Chair Grooming: Minimal assistance Where Assessed-Grooming: Standing at sink Upper Body Bathing: Supervision/safety Where Assessed-Upper Body Bathing: Shower, Chair Lower Body Bathing: Minimal assistance Where Assessed-Lower Body Bathing: Shower, Chair Upper Body Dressing: Minimal assistance Where Assessed-Upper Body Dressing: Edge of bed Lower Body Dressing: Moderate assistance Where Assessed-Lower Body Dressing: Edge of bed Toileting: Moderate assistance Where  Assessed-Toileting: Bedside Commode Toilet Transfer: Moderate assistance Toilet Transfer Method: Proofreader: Gaffer: Not assessed Film/video editor: Moderate assistance Film/video editor Method: Designer, industrial/product: Grab bars, Manufacturing systems engineer    Praxis   Exercises:   Other Treatments:     Therapy/Group: Individual Therapy  Daneil Dan 04/11/2021, 8:58 AM

## 2021-04-11 NOTE — Progress Notes (Signed)
Physical Therapy Session Note  Patient Details  Name: Christine Fritz MRN: [id number] Date of Birth: Apr 23, 1961  Today's Date: 04/11/2021 PT Individual Time: 1305-1400 PT Individual Time Calculation (min): 55 min   Short Term Goals: Week 1:  PT Short Term Goal 1 (Week 1): pt will complete bed mobility with CGA PT Short Term Goal 2 (Week 1): Pt will complete bed<>chair transfers with CGA and LRAD PT Short Term Goal 3 (Week 1): Pt will ambulate 173f with CGA and LRAD PT Short Term Goal 4 (Week 1): Pt will navigate up/down x4 steps with minA and 2 hand rails  Skilled Therapeutic Interventions/Progress Updates: pt presented in w/c with husband present agreeable to therapy. Pt denies pain but does state some fatigue. Rest breaks provided as needed. Pt transported to rehab gym for energy conservation. Performed stand pivot to high/low mat. Pt then particiapted in toe taps to 4 in step x 10 bilaterally for forced use of RLE and increasing hip flexion. Cues required to lift foot off of step vs dragging it off. Pt then performed same activity with 6 in step with improved foot clearance. Participated in reaching activity of placing cards on back of mirror for forced use of RUE in standing with no LOB, nor any lateral leans. Performed step forwards/backwards on level surface stepping over hockey stick. Pt required cues for increased weight bearing on RLE when stepping over stick as would initially maintain straight LE. Participated in gait training [id number] with CGA while kicking yoga block to improve knee flexion on step through. Pt then ambulated [id number] to w/c with CGA and cues for improved heel strike. Pt transported back to room at end of session and performed ambulatory transfer to recliner. Pt left in recliner at end of session with seat alarm on, call bell within reach and needs met.      Therapy Documentation Precautions:  Precautions Precautions: None Precaution Comments: R weakness,  incoordination Restrictions Weight Bearing Restrictions: No General:   Vital Signs: Therapy Vitals Temp: 97.9 F (36.6 C) Pulse Rate: 81 Resp: 18 BP: (!) 151/96 Patient Position (if appropriate): Sitting Oxygen Therapy SpO2: 98 % O2 Device: Room Air Pain:   Mobility:   Locomotion :    Trunk/Postural Assessment :    Balance:   Exercises:   Other Treatments:      Therapy/Group: Individual Therapy  Reynold Mantell 04/11/2021, 3:46 PM

## 2021-04-11 NOTE — Progress Notes (Addendum)
Physical Therapy Session Note  Patient Details  Name: Christine Fritz MRN: [id number] Date of Birth: 1961-03-31  Today's Date: 04/11/2021 PT Individual Time: 3875-6433; 1435-1500 PT Individual Time Calculation (min): 63 min , 25 min  Short Term Goals: Week 1:  PT Short Term Goal 1 (Week 1): pt will complete bed mobility with CGA PT Short Term Goal 2 (Week 1): Pt will complete bed<>chair transfers with CGA and LRAD PT Short Term Goal 3 (Week 1): Pt will ambulate 163ft with CGA and LRAD PT Short Term Goal 4 (Week 1): Pt will navigate up/down x4 steps with minA and 2 hand rails  Skilled Therapeutic Interventions/Progress Updates:  Tx 1:  Pt received in bed.  She denied pain, but stated that bil upper arms were sore from activity yesterday. No soreness noted R shoulder joint.  neuromuscular re-education via demo, multimodal cues for hook lying: bil bridging with adductor squeezes; seated with bil foot support: trunk extension/flexion to neutral, R ankle pumps with R knee extended, R/L lateral leans; sustained stretch x 3 min of bil soleus muscles with feet on wedge and mat table lowered.  Sit>< stand with mod cues for technique, CGA.  Balance challenge and sustained stretch bil heel cords, standing with forefeet on blue wedge, x 2 minutes with bil UE light support. Seated rest x 2 minutes. Therapeutic activity in standing with forefeet on wedge, x 3 minutes during R hand fine motor activity of manipulating clothes pins on/off horizontal bars at chest level, on table in front of her, with L hand resting at side.  No LOB.  Cues for slower movements and visual tracking to address RUE ataxia.  Sit<> stand throughout session x 5, with UEs crossed over chest, CGA.  Gait training on level tile, RW, through obstacle course x 25', CGA. Gait velocity is slow.  Up/down (7) 3" hight steps, bil rails.  Ascending leading with L foot, step -to method, CGA; descending backwards leading with R foot, step-to  method, with CG> min assist for R knee buckling x 1 as she fatigued.  At end of session, pt seated in wc with seat pad alarm set, visiting with her mother who had arrived.  Tx 2:  Pt resting in recliner.  Husband present.  Pt denied pain.  neuromuscular re-education via demo and multimodal cues for seated (with LEs extended on footrest) for 20 x 1 each --bil quad sets with bil ankle DF, and bil glut sets.  Sit> stand without cues, CGA to RW.  Gait training with RW, CGA x 100' over level tile, multiple turns, working on increasing fluidity and velocity, and = step lengths, with good carryover.  R knee buckled mildly intermittently but pt self -corrected.  No LOB.   Discussed supportive shoes with pt and husband; they voiced understanding.  At end of session, pt resting in recliner with seat pad alarm set and husband present.     Therapy Documentation Precautions: falls     Therapy/Group: Individual Therapy  Maalle Starrett 04/11/2021, 12:30 PM

## 2021-04-12 DIAGNOSIS — I679 Cerebrovascular disease, unspecified: Secondary | ICD-10-CM | POA: Diagnosis not present

## 2021-04-12 MED ORDER — ROPINIROLE HCL 1 MG PO TABS
1.0000 mg | ORAL_TABLET | Freq: Once | ORAL | Status: AC
  Administered 2021-04-12: 1 mg via ORAL
  Filled 2021-04-12: qty 1

## 2021-04-12 MED ORDER — AMLODIPINE BESYLATE 10 MG PO TABS
10.0000 mg | ORAL_TABLET | Freq: Every day | ORAL | Status: DC
  Administered 2021-04-13 – 2021-04-17 (×5): 10 mg via ORAL
  Filled 2021-04-12 (×5): qty 1

## 2021-04-12 NOTE — Progress Notes (Signed)
Patient requesting medication for restless leg syndrome, said she mentioned it earlier today but nothing was done about it and pain is keeping her up all night. Called PA, Dan, on call and received verbal order for one time of Requip; with provider able to re evaluate in AM.

## 2021-04-12 NOTE — Progress Notes (Signed)
Physical Therapy Session Note  Patient Details  Name: Christine Fritz MRN: [id number] Date of Birth: 05-25-1960  Today's Date: 04/12/2021 PT Individual Time: 5427-0623 + 1500-1525 PT Individual Time Calculation (min): 54 min  + 25 min  Short Term Goals: Week 1:  PT Short Term Goal 1 (Week 1): pt will complete bed mobility with CGA PT Short Term Goal 2 (Week 1): Pt will complete bed<>chair transfers with CGA and LRAD PT Short Term Goal 3 (Week 1): Pt will ambulate 181ft with CGA and LRAD PT Short Term Goal 4 (Week 1): Pt will navigate up/down x4 steps with minA and 2 hand rails  Skilled Therapeutic Interventions/Progress Updates:     1st session: Pt seated EOB to start session - agreeable to PT tx without reports of pain.   Completes sit<>stand transfers with close supervision and RW - able to transtiion to stand<>pivot transfers at supervision level with RW to w/c. VC for safety awareness, RW management, and keeping body within walker frame during transfer.  Transported downstairs to 73M rehab gym. Reassessed RLE strength which show's some improvement since last Friday (11/18) PT evaluation. R hip flex 3+/5, knee ext 4-/5, ankle DF 3+/5.   Active warm up of 2x10 sit<>stands with mirror for visual feedback, encouraging equal weight shift bilaterally due to LLE bias.  Gait training 247ft with close supervision (fading to CGA after ~134ft) with RW. Demonstrates step-through gait pattern, improved R foot clearance and heel strike on initial contact. Gait quality fades after 16ft with R foot drag, decreased hip/knee flexion, and decreased R weight shift. VC throughout for normalizing gait pattern.   Stair training up/down x12, 6inch steps with CGA and 2 hand rails with VC needed for sequencing and technique with appropriate carryover and no evidence of knee buckling observed. Pt reports her hand rails are too far apart to reach both. She navigated up/down x4, 6inch steps with 1 hand rail (facing  hand rail), via side-stepping method - CGA for safety. Some overcrowding of her feet during stepping, requiring min cues for awareness of R foot placement.  NMR for RLE strengthening with forward/backward stepping on declined wedge, promoting R stance control, minA for balance and steadying. Unilateral UE support to back of arm chair.  Transported back upstairs to her room. Ambulatory transfer within her room with supervision and RW. Remained seated in recliner with chair alarm on, all needs in reach.   2nd session: Pt seated in recliner to start session. Agreeable to therapy without reports of pain. Does report generalized fatigue from busy day of therapy. Sit<>stand to RW with supervision from recliner. Ambulated ~156ft with CGA and RW within hallways to [facility] rehab gym. Min verbal cues for monitoring R foot during gait due to mild ataxia. In Rehab gym, worked on RLE strengthening with terminal knee extensions on 4inch block, unsupported with minA needed for balance, 2x15 reps. Pt reporting knee instability while completing. Pt ambulated back to her room in similar manner as above - x1 LOB to the R while turning as her R foot got caught in walker frame, required minA for stability. Once we returned to her room, pt requesting to remain seated in recliner. Chair alarm on, all needs within reach.   Therapy Documentation Precautions:  Precautions Precautions: None Precaution Comments: R weakness, incoordination Restrictions Weight Bearing Restrictions: No General:    Therapy/Group: Individual Therapy  [name] 04/12/2021, 7:37 AM

## 2021-04-12 NOTE — Progress Notes (Signed)
Occupational Therapy Session Note  Patient Details  Name: Christine Fritz MRN: [id number] Date of Birth: [birth year]  Today's Date: 04/12/2021 OT Individual Time: 1145-1230 OT Individual Time Calculation (min): 45 min    Short Term Goals: Week 1:  OT Short Term Goal 1 (Week 1): Pt will complete UB dressing with supervision. OT Short Term Goal 2 (Week 1): Pt will complete LB dressing with min assist. OT Short Term Goal 3 (Week 1): Pt will complete stand pivot transfer to the 3:1 with use of the RW and min guard assist. OT Short Term Goal 4 (Week 1): Pt will use the RUE at a diminshed level during selfcare tasks with supervision.  Skilled Therapeutic Interventions/Progress Updates:    Pt sitting up in w/c, no c/o pain, agreeable to OT session. Reports she is having trouble with right arm control.  Pt ambulated approximately 75 feet using RW with close supervision to small gym.  UBE x 10 minutes at level 5 to facilitate motor priming and BUE use.  Pt ambulated to table and fine motor assessed: Pt able to oppose thumb to digits WNL and good control of all digits noted, however gross motor control particularly at forearm and shoulder noted.  Pt participated in gross motor reach and toss to complete horse shoe game.  Pt able to hit target 2/12 attempts.  Pt ambulated back to room with close supervision noting decreased right foot clearance and slight unsteadiness in RLE during stance phase of gait with pt reporting her RLE feels tired.  Pt returned to w/c, call bell in reach, seat alarm on, set up with lunch tray.  Therapy Documentation Precautions:  Precautions Precautions: None Precaution Comments: R weakness, incoordination Restrictions Weight Bearing Restrictions: No    Therapy/Group: Individual Therapy  Amie Critchley 04/12/2021, 3:03 PM

## 2021-04-12 NOTE — Progress Notes (Signed)
Patient ID: KENISE BARRACO, female   DOB: 1960-08-21, 60 y.o.   MRN: [id number] Met with the patient to introduce self and role. Reviewed medical status and secondary risks including HTN and HLD. Reviewed medications and dietary modification recommendations. Continue to follow along to discharge to address educational needs and collaborate with the team to facilitate smooth discharge. Margarito Liner

## 2021-04-12 NOTE — Progress Notes (Signed)
Occupational Therapy Session Note  Patient Details  Name: Christine Fritz MRN: [id number] Date of Birth: 1960-07-09  Today's Date: 04/12/2021 OT Individual Time: 5809-9833 OT Individual Time Calculation (min): 57 min    Short Term Goals: Week 1:  OT Short Term Goal 1 (Week 1): Pt will complete UB dressing with supervision. OT Short Term Goal 2 (Week 1): Pt will complete LB dressing with min assist. OT Short Term Goal 3 (Week 1): Pt will complete stand pivot transfer to the 3:1 with use of the RW and min guard assist. OT Short Term Goal 4 (Week 1): Pt will use the RUE at a diminshed level during selfcare tasks with supervision.  Skilled Therapeutic Interventions/Progress Updates:    Pt greeted seated in recliner and agreeable to OT treatment session. Pt reported no pain. Sit<>stand from reclier with RW and min A. She then ambulated to the closet with education provided for RW management to access closet. Pt able to collect clothing using B UE's but ataxia noted on R side. Pt voided bladder on commode and needed min A to doff pants. CGA for transfer into shower over small ledge. Bathing completed with CGA for balance. Pt completed dressing tasks seated EOB with min A and education provided for LB dressing strategies. Forced use of R UE throughout BADL's for neuro re-ed. OT issued soft tan theraputty and educated on exercises for strength and coordination. OT also gave pt small foam cubes and worked on Comptroller. Pt left seated in recliner with call bell in reach and needs met.   Therapy Documentation Precautions:  Precautions Precautions: None Precaution Comments: R weakness, incoordination Restrictions Weight Bearing Restrictions: No Pain:  DENIES PAIN   Therapy/Group: Individual Therapy  Valma Cava 04/12/2021, 10:37 AM

## 2021-04-12 NOTE — Progress Notes (Signed)
[place] PHYSICAL MEDICINE & REHABILITATION PROGRESS NOTE  Subjective/Complaints: No complaints or questions this morning SBP and DBP slightly elevated  ROS: Denies CP, SOB, N/V/D  Objective: Vital Signs: Blood pressure (!) 146/90, pulse 80, temperature 97.9 F (36.6 C), resp. rate 18, SpO2 97 %. No results found. No results for input(s): WBC, HGB, HCT, PLT in the last 72 hours.  No results for input(s): NA, K, CL, CO2, GLUCOSE, BUN, CREATININE, CALCIUM in the last 72 hours.   Intake/Output Summary (Last 24 hours) at 04/12/2021 1236 Last data filed at 04/12/2021 0809 Gross per 24 hour  Intake 592 ml  Output --  Net 592 ml        Physical Exam: BP (!) 146/90 (BP Location: Right Arm)   Pulse 80   Temp 97.9 F (36.6 C)   Resp 18   SpO2 97%  Constitutional: No distress . Vital signs reviewed.  Morbidly obese. HENT: Normocephalic.  Atraumatic. Eyes: EOMI. No discharge. Cardiovascular: No JVD.  RRR. Respiratory: Normal effort.  No stridor.  Bilateral clear to auscultation. GI: Non-distended.  BS +. Skin: Warm and dry.  Intact. Psych: Normal mood.  Normal behavior. Musc: No edema in extremities.  No tenderness in extremities. Neuro: Alert. RUE 4+/5. RLE 4+/5, stable. LUE and LLE 5/5. Right sided ataxia  Assessment/Plan: 1. Functional deficits which require 3+ hours per day of interdisciplinary therapy in a comprehensive inpatient rehab setting. Physiatrist is providing close team supervision and 24 hour management of active medical problems listed below. Physiatrist and rehab team continue to assess barriers to discharge/monitor patient progress toward functional and medical goals   Care Tool:  Bathing    Body parts bathed by patient: Right arm, Left arm, Chest, Abdomen, Front perineal area, Buttocks, Right upper leg, Left upper leg, Right lower leg, Left lower leg, Face         Bathing assist Assist Level: Minimal Assistance - Patient > 75%     Upper Body  Dressing/Undressing Upper body dressing   What is the patient wearing?: Pull over shirt, Bra    Upper body assist Assist Level: Minimal Assistance - Patient > 75%    Lower Body Dressing/Undressing Lower body dressing      What is the patient wearing?: Pants     Lower body assist Assist for lower body dressing: Supervision/Verbal cueing     Toileting Toileting    Toileting assist Assist for toileting: Contact Guard/Touching assist     Transfers Chair/bed transfer  Transfers assist     Chair/bed transfer assist level: Supervision/Verbal cueing     Locomotion Ambulation   Ambulation assist      Assist level: Supervision/Verbal cueing Assistive device: Walker-rolling Max distance: 21910ft   Walk 10 feet activity   Assist     Assist level: Supervision/Verbal cueing Assistive device: Walker-rolling   Walk 50 feet activity   Assist    Assist level: Supervision/Verbal cueing Assistive device: Walker-rolling    Walk 150 feet activity   Assist Walk 150 feet activity did not occur: Safety/medical concerns (fatigue)  Assist level: Supervision/Verbal cueing Assistive device: Walker-rolling    Walk 10 feet on uneven surface  activity   Assist Walk 10 feet on uneven surfaces activity did not occur: Safety/medical concerns         Wheelchair     Assist Is the patient using a wheelchair?: Yes Type of Wheelchair: Manual    Wheelchair assist level: Minimal Assistance - Patient > 75% Max wheelchair distance: 50  Wheelchair 50 feet with 2 turns activity    Assist        Assist Level: Minimal Assistance - Patient > 75%   Wheelchair 150 feet activity     Assist      Assist Level: Dependent - Patient 0%    Medical Problem List and Plan: 1.  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  Impaired mobility: ambulating >225 feet, may d/c  Lovenox             -antiplatelet therapy: Aspirin 81 mg daily and Plavix 75 mg day x3 weeks and aspirin alone 3. Pain Management: Tylenol as needed 4. Mood: Provide emotional support             -antipsychotic agents: N/A 5. Neuropsych: This patient is capable of making decisions on her own behalf. 6. Skin/Wound Care: Routine skin checks 7. Fluids/Electrolytes/Nutrition: Routine in and outs 8.  Hypertension.  Increase Norvasc to 10mg .  9.  New findings hypothyroidism.  Continue Synthroid.  Follow-up thyroid panel 3 to 4 weeks 10.  Admission elevated creatinine 1.59.  Suspect CKD stage III.    Creatinine 1.71 on 11/18, repeat tomorrow.              -encourage adquate fluids 11.  Moderate pericardial effusion identified on echocardiogram.  Follow-up echocardiogram 12.  Morbid obesity.  BMI 42.40.  Dietary follow-up 13. Slow transit constipation:             -add scheduled miralax and senna-s             -sorbitol prn   Improving 14. Suboptimal potassium: repeat K+ tomorrow morning and supplement if less than 4  LOS: 4 days A FACE TO FACE EVALUATION WAS PERFORMED  12/18 Christine Fritz 04/12/2021, 12:36 PM

## 2021-04-13 DIAGNOSIS — I679 Cerebrovascular disease, unspecified: Secondary | ICD-10-CM | POA: Diagnosis not present

## 2021-04-13 LAB — BASIC METABOLIC PANEL
Anion gap: 8 (ref 5–15)
BUN: 21 mg/dL — ABNORMAL HIGH (ref 6–20)
CO2: 27 mmol/L (ref 22–32)
Calcium: 9 mg/dL (ref 8.9–10.3)
Chloride: 100 mmol/L (ref 98–111)
Creatinine, Ser: 1.59 mg/dL — ABNORMAL HIGH (ref 0.44–1.00)
GFR, Estimated: 37 mL/min — ABNORMAL LOW (ref >60)
Glucose, Bld: 105 mg/dL — ABNORMAL HIGH (ref 70–99)
Potassium: 3.8 mmol/L (ref 3.5–5.1)
Sodium: 135 mmol/L (ref 135–145)

## 2021-04-13 MED ORDER — ROPINIROLE HCL 1 MG PO TABS
1.0000 mg | ORAL_TABLET | Freq: Every day | ORAL | Status: DC
  Administered 2021-04-13 – 2021-04-16 (×4): 1 mg via ORAL
  Filled 2021-04-13 (×4): qty 1

## 2021-04-13 NOTE — Patient Care Conference (Incomplete)
Inpatient RehabilitationTeam Conference and Plan of Care Update Date: 04/13/2021   Time: 5:14 PM    Patient Name: Christine Fritz      Medical Record Number: [id number]  Date of Birth: 24-Nov-1960 Sex: Female         Room/Bed: 5C08C/5C08C-01 Payor Info: Payor: [employer] / Plan: BC[employer] PPO / Product Type: *No Product type* /    Admit Date/Time:  04/08/2021  4:01 PM  Primary Diagnosis:  Small vessel disease, cerebrovascular  Hospital Problems: Principal Problem:   Small vessel disease, cerebrovascular Active Problems:   Slow transit constipation   Stage 3a chronic kidney disease (HCC)   Essential hypertension    Expected Discharge Date:    Team Members Present:       Current Status/Progress Goal Weekly Team Focus  Bowel/Bladder             Swallow/Nutrition/ Hydration             ADL's   supervision- CGA functional mobility using RW; min assist overall self care; mild ataxia RUE  independent-supervision  neuro re-ed, functional transfers, endurance, balance, self care training   Mobility   supervision bed mobility, supervision sit<>stand to RW, CGA gait >125ft with RW, CGA 12 steps with 2 hand rails  Supervision  RLE NMR, balance training, gait training, DC planning   Communication             Safety/Cognition/ Behavioral Observations            Pain             Skin               Discharge Planning:      Team Discussion: *** Patient on target to meet rehab goals: {IP REHAB YES/NO WITH [id number]:89381}  *See Care Plan and progress notes for long and short-term goals.   Revisions to Treatment Plan:  ***  Teaching Needs: ***  Current Barriers to Discharge: {BARRIERS TO OFBPZWCHE:52778}  Possible Resolutions to Barriers: ***     Medical Summary               I attest that I was present, lead the team conference, and concur with the assessment and plan of the team.   Bluford Kaufmann 04/13/2021, 5:14 PM

## 2021-04-13 NOTE — Progress Notes (Signed)
Physical Therapy Session Note  Patient Details  Name: Christine Fritz MRN: [id number] Date of Birth: 1960/06/10  Today's Date: 04/13/2021 PT Individual Time: 0800-0857 PT Individual Time Calculation (min): 57 min   Short Term Goals: Week 1:  PT Short Term Goal 1 (Week 1): pt will complete bed mobility with CGA PT Short Term Goal 2 (Week 1): Pt will complete bed<>chair transfers with CGA and LRAD PT Short Term Goal 3 (Week 1): Pt will ambulate 137ft with CGA and LRAD PT Short Term Goal 4 (Week 1): Pt will navigate up/down x4 steps with minA and 2 hand rails  Skilled Therapeutic Interventions/Progress Updates:     Pt greeted seated in recliner, ready to begin therapy. No reports of pain. Reports that her husband brought in tennis shoes from home. These were donned with totalA for time - some difficulty due to hospital socks as pt has no personal socks from home. Sit<>stand to RW with CGA and she ambulated from her room, downstairs to 38M rehab gym, ~26ft, with minA and RW. MinA needed due to frequent toe drag on R - pt contributes to her shoes as this wasn't occurring yesterday. Pt frustrated with performance. Retrieved XL foot-up brace and gait training continued where she required CGA and RW - improved R foot clearance during swing but still occurring at times. She completed furniture transfers in ADL apartment suite with CGA and RW and practiced reaching for objects in high/low cabinets with minA for balance. Instructed in stair training where she navigated x8, 6 inch steps with minA and 1 hand rail, forward facing. Required reminders for stepping up with L foot leading ascent and R foot leading descent. No R knee buckling observed. Pt then instructed in standing there-ex in // bars with CGA for balance and mirror for visual feedback. Completed hamstring curls, heel raises, high marching, and mini-squats, 1x10 reps each. Seated rest break prior to ambulating back upstairs with RW and minA - increased  fatigue after ~131ft required increased cues for R foot clearance. Pt reports soft knee buckling and "weak knee" on R towards end of session. She remained seated in recliner at completion of session with chair alarm on and all needs in reach.   Therapy Documentation Precautions:  Precautions Precautions: None Precaution Comments: R weakness, incoordination Restrictions Weight Bearing Restrictions: No General:    Therapy/Group: Individual Therapy  [name] 04/13/2021, 7:31 AM

## 2021-04-13 NOTE — Progress Notes (Signed)
Occupational Therapy Session Note  Patient Details  Name: Christine Fritz MRN: [id number] Date of Birth: 1961/02/21  Today's Date: 04/13/2021 OT Individual Time: 1005-1105 OT Individual Time Calculation (min): 60 min    Short Term Goals: Week 1:  OT Short Term Goal 1 (Week 1): Pt will complete UB dressing with supervision. OT Short Term Goal 2 (Week 1): Pt will complete LB dressing with min assist. OT Short Term Goal 3 (Week 1): Pt will complete stand pivot transfer to the 3:1 with use of the RW and min guard assist. OT Short Term Goal 4 (Week 1): Pt will use the RUE at a diminshed level during selfcare tasks with supervision.  Skilled Therapeutic Interventions/Progress Updates:    Pt sitting up in w/c, no c/o pain, already bathed and dressed with nursing this am.  Agreeable to neuro re-ed; ambulated using RW with close supervision approximately 200 feet using elevator to lower floor and to large gym.  Pt completed Box and Blocks and 9 hole Peg test with the following results:  Bocks and Blocks: Left 41 blocks; Right 33 blocks 9 Hole Peg Test:  Left 23 seconds; Right 35 seconds  Pt exhibits dyskinesias in right dominant arm compared to left per standardized testing results.  Pt did not drop any items and able to manipulate in hand with normal movements however gross motor dyskinesias noted limiting control distally.  Instructed pt through ball toss/catch with BUE use 3 x 10 reps.  Pt then completed vertical toss/catch with small balls of various weight and textures using right hand.  Pt frequently dropping ball during this task.  Pt completed 3 x 20 reps ball toss and catch using BUE at rebounder in standing without use of AD needing supervision.  Pt attempted to ambulate to retrieve ball from floor (dropped during task) without AD and required hand held and min overall assist secondary to unsteadiness and slight fear of falling.  Ambulated back to room using RW with close supervision.  Call  bell in reach, seat alarm on at end of session.    Therapy Documentation Precautions:  Precautions Precautions: None Precaution Comments: R weakness, incoordination Restrictions Weight Bearing Restrictions: No    Therapy/Group: Individual Therapy  Amie Critchley 04/13/2021, 12:36 PM

## 2021-04-13 NOTE — Progress Notes (Signed)
[place] PHYSICAL MEDICINE & REHABILITATION PROGRESS NOTE  Subjective/Complaints: RLS symptoms, started on requip yest   ROS: Denies CP, SOB, N/V/D  Objective: Vital Signs: Blood pressure (!) 138/117, pulse 83, temperature (!) 97.5 F (36.4 C), temperature source Oral, resp. rate 17, SpO2 98 %. No results found. No results for input(s): WBC, HGB, HCT, PLT in the last 72 hours.  Recent Labs    04/13/21 0456  NA 135  K 3.8  CL 100  CO2 27  GLUCOSE 105*  BUN 21*  CREATININE 1.59*  CALCIUM 9.0     Intake/Output Summary (Last 24 hours) at 04/13/2021 0836 Last data filed at 04/13/2021 0759 Gross per 24 hour  Intake 712 ml  Output --  Net 712 ml         Physical Exam: BP (!) 138/117 (BP Location: Left Arm)   Pulse 83   Temp (!) 97.5 F (36.4 C) (Oral)   Resp 17   SpO2 98%  Constitutional: No distress . Vital signs reviewed.  Morbidly obese. HENT: Normocephalic.  Atraumatic. Eyes: EOMI. No discharge. Cardiovascular: No JVD.  RRR. Respiratory: Normal effort.  No stridor.  Bilateral clear to auscultation. GI: Non-distended.  BS +. Skin: Warm and dry.  Intact. Psych: Normal mood.  Normal behavior. Musc: No edema in extremities.  No tenderness in extremities. Neuro: Alert. RUE 4+/5. RLE 4+/5, stable. LUE and LLE 5/5. Right sided ataxia  Assessment/Plan: 1. Functional deficits which require 3+ hours per day of interdisciplinary therapy in a comprehensive inpatient rehab setting. Physiatrist is providing close team supervision and 24 hour management of active medical problems listed below. Physiatrist and rehab team continue to assess barriers to discharge/monitor patient progress toward functional and medical goals   Care Tool:  Bathing    Body parts bathed by patient: Right arm, Left arm, Chest, Abdomen, Front perineal area, Buttocks, Right upper leg, Left upper leg, Right lower leg, Left lower leg, Face         Bathing assist Assist Level: Minimal  Assistance - Patient > 75%     Upper Body Dressing/Undressing Upper body dressing   What is the patient wearing?: Pull over shirt, Bra    Upper body assist Assist Level: Minimal Assistance - Patient > 75%    Lower Body Dressing/Undressing Lower body dressing      What is the patient wearing?: Pants     Lower body assist Assist for lower body dressing: Supervision/Verbal cueing     Toileting Toileting    Toileting assist Assist for toileting: Contact Guard/Touching assist     Transfers Chair/bed transfer  Transfers assist     Chair/bed transfer assist level: Supervision/Verbal cueing     Locomotion Ambulation   Ambulation assist      Assist level: Supervision/Verbal cueing Assistive device: Walker-rolling Max distance: 267ft   Walk 10 feet activity   Assist     Assist level: Supervision/Verbal cueing Assistive device: Walker-rolling   Walk 50 feet activity   Assist    Assist level: Supervision/Verbal cueing Assistive device: Walker-rolling    Walk 150 feet activity   Assist Walk 150 feet activity did not occur: Safety/medical concerns (fatigue)  Assist level: Supervision/Verbal cueing Assistive device: Walker-rolling    Walk 10 feet on uneven surface  activity   Assist Walk 10 feet on uneven surfaces activity did not occur: Safety/medical concerns         Wheelchair     Assist Is the patient using a wheelchair?: Yes Type of Wheelchair:  Manual    Wheelchair assist level: Minimal Assistance - Patient > 75% Max wheelchair distance: 50    Wheelchair 50 feet with 2 turns activity    Assist        Assist Level: Minimal Assistance - Patient > 75%   Wheelchair 150 feet activity     Assist      Assist Level: Dependent - Patient 0%    Medical Problem List and Plan: 1.  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 2.  Impaired mobility: ambulating >225 feet, may d/c Lovenox             -antiplatelet therapy: Aspirin 81 mg daily and Plavix 75 mg day x3 weeks and aspirin alone 3. Pain Management: Tylenol as needed 4. Mood: Provide emotional support             -antipsychotic agents: N/A 5. Neuropsych: This patient is capable of making decisions on her own behalf. 6. Skin/Wound Care: Routine skin checks 7. Fluids/Electrolytes/Nutrition: Routine in and outs 8.  Hypertension.  Increase Norvasc to 10mg .  Vitals:   04/13/21 0451 04/13/21 1306  BP: (!) 138/117 (!) 146/87  Pulse: 83 81  Resp: 17 16  Temp: (!) 97.5 F (36.4 C) 98 F (36.7 C)  SpO2: 98% 98%  Some lability will continue to monitor on current dose 9.  New findings hypothyroidism.  Continue Synthroid.  Follow-up thyroid panel 3 to 4 weeks 10.  Admission elevated creatinine 1.59.  Suspect CKD stage III.    Creatinine 1.71 on 11/18,fairly stable at 1.59 on 11/21             -encourage adquate fluids 11.  Moderate pericardial effusion identified on echocardiogram.  Follow-up echocardiogram 12.  Morbid obesity.  BMI 42.40.  Dietary follow-up 13. Slow transit constipation:             -add scheduled miralax and senna-s             -sorbitol prn   Improving 14. Normal K+ cont to monitor  15.  ? RLS sx, received requip x 1 last noc   LOS: 5 days A FACE TO FACE EVALUATION WAS PERFORMED  [name] 04/13/2021, 8:36 AM

## 2021-04-13 NOTE — Progress Notes (Signed)
Physical Therapy Session Note  Patient Details  Name: Christine Fritz MRN: [id number] Date of Birth: 12/13/1960  Today's Date: 04/13/2021 PT Individual Time: 1300-1415 PT Individual Time Calculation (min): 75 min   Short Term Goals: Week 1:  PT Short Term Goal 1 (Week 1): pt will complete bed mobility with CGA PT Short Term Goal 2 (Week 1): Pt will complete bed<>chair transfers with CGA and LRAD PT Short Term Goal 3 (Week 1): Pt will ambulate 147ft with CGA and LRAD PT Short Term Goal 4 (Week 1): Pt will navigate up/down x4 steps with minA and 2 hand rails Week 2:     Skilled Therapeutic Interventions/Progress Updates:  Pt denies pain.  Pt initially oob in recliner.    Short distance gait recliner to wc w/RW and cga. Transported to small gym on unit.   Gait training: 247ft w/RW w/cga, mild decreased clearance RLE, mild wobbles R knee w/fatigue w/no buckling, mild unsteadiness, decreased cadence.  50 x 2, 133ft x 1 no AD, increase in deviations but to mild extent, decreased cadence, cga to light min assist, one balance loss w/min assist for recovery when turning to R.  Standing balance: Standing w/4lb ball - overhead reach to forward press to forward bend repeated x 5 w/cga                                Diagonal overhead to below waist cross body x 5 to L, x 5 to R w/cga Sidestepping at hallway rail w/cues to avoid R hip ER 60ft x 2 L/x2R  Nustep L 4 x  475 total steps for cardiovascular challenge. Short distance gait/transfer to recliner w/cga using RW. Pt left oob in recliner w/chair alarm set and needs in reach.   Therapy Documentation Precautions:  Precautions Precautions: None Precaution Comments: R weakness, incoordination Restrictions Weight Bearing Restrictions: No  Therapy/Group: Individual Therapy Rada Hay, PT   Shearon Balo 04/13/2021, 2:33 PM

## 2021-04-14 DIAGNOSIS — I639 Cerebral infarction, unspecified: Secondary | ICD-10-CM | POA: Diagnosis not present

## 2021-04-14 DIAGNOSIS — I679 Cerebrovascular disease, unspecified: Secondary | ICD-10-CM | POA: Diagnosis not present

## 2021-04-14 MED ORDER — COVID-19MRNA BIVAL VACC PFIZER 30 MCG/0.3ML IM SUSP
0.3000 mL | Freq: Once | INTRAMUSCULAR | Status: AC
  Administered 2021-04-14: 0.3 mL via INTRAMUSCULAR
  Filled 2021-04-14: qty 0.3

## 2021-04-14 MED ORDER — ROPINIROLE HCL 1 MG PO TABS: 1.0000 mg | ORAL_TABLET | Freq: Every day | ORAL | 0 refills | Status: AC

## 2021-04-14 MED ORDER — AMLODIPINE BESYLATE 10 MG PO TABS: 10.0000 mg | ORAL_TABLET | Freq: Every day | ORAL | 0 refills | Status: AC

## 2021-04-14 MED ORDER — CLOPIDOGREL BISULFATE 75 MG PO TABS: 75.0000 mg | ORAL_TABLET | Freq: Every day | ORAL | 0 refills | Status: DC

## 2021-04-14 MED ORDER — ACETAMINOPHEN 325 MG PO TABS: 650.0000 mg | ORAL_TABLET | ORAL | Status: AC | PRN

## 2021-04-14 MED ORDER — INFLUENZA VAC SPLIT QUAD 0.5 ML IM SUSY
0.5000 mL | PREFILLED_SYRINGE | INTRAMUSCULAR | Status: AC
  Administered 2021-04-15: 0.5 mL via INTRAMUSCULAR
  Filled 2021-04-14: qty 0.5

## 2021-04-14 MED ORDER — LEVOTHYROXINE SODIUM 100 MCG PO TABS: 100.0000 ug | ORAL_TABLET | Freq: Every day | ORAL | 0 refills | Status: DC

## 2021-04-14 MED ORDER — POLYETHYLENE GLYCOL 3350 17 G PO PACK: 17.0000 g | PACK | Freq: Every day | ORAL | 0 refills | Status: DC

## 2021-04-14 MED ORDER — SENNOSIDES-DOCUSATE SODIUM 8.6-50 MG PO TABS: 2.0000 | ORAL_TABLET | Freq: Every day | ORAL | Status: DC

## 2021-04-14 MED ORDER — ATORVASTATIN CALCIUM 80 MG PO TABS: 80.0000 mg | ORAL_TABLET | Freq: Every day | ORAL | 0 refills | Status: AC

## 2021-04-14 MED ORDER — MAGNESIUM OXIDE 400 MG PO TABS: 400.0000 mg | ORAL_TABLET | Freq: Every day | ORAL | 0 refills | Status: AC

## 2021-04-14 NOTE — Progress Notes (Signed)
Physical Therapy Session Note  Patient Details  Name: Christine Fritz MRN: [id number] Date of Birth: 1960-10-08  Today's Date: 04/14/2021 PT Individual Time: 1330-1430 PT Individual Time Calculation (min): 60 min   Short Term Goals: Week 1:  PT Short Term Goal 1 (Week 1): pt will complete bed mobility with CGA PT Short Term Goal 2 (Week 1): Pt will complete bed<>chair transfers with CGA and LRAD PT Short Term Goal 3 (Week 1): Pt will ambulate 167ft with CGA and LRAD PT Short Term Goal 4 (Week 1): Pt will navigate up/down x4 steps with minA and 2 hand rails  Skilled Therapeutic Interventions/Progress Updates:     Pt received seated in recliner and agrees to therapy. No complaint of pain. Pt performs sit to stand with CGA. Pt ambulates from room on 5th floor to elevators, manages elevator with supervision, and ambulates to ortho gym, x250' total. Pt requires minA and no AD, with facilitation of R lateral weight shifting to promote symmetrical and reciprocal gait pattern. Pt takes extended seated rest break and ambulates additional 150'.   Pt performs NMR for standing balance and R hemibody WB, tasked with tapping left foot on colored cones, depending on PT command. Mirror provided for visual feedback. Initially pt requires modA for stability and multimodal cues for R lateral weight shifting. Pt progresses to only requiring CGA/minA. Following seated rest break, pt performs again with incorporation of tapping either L or R foot to cones, depending on command. Pt working on coordination and hip flexor strength on R as well as stability and WB during stance phase. Pt requires minA to modA as she fatigues.  Pt performs standing high marches to strengthen hip flexors in parallel bars. Pt cued to raise thigh to touch bar, with bilateral upper extremity support on bar. Pt completes 3x10 with R lower extremity, and 2x10 with L lower extremity. PT provides tactile cues for optimal posture.  Pt transported  back to room via WC. Stand pivot to recliner with CGA. Left seated with all needs within reach.  Therapy Documentation Precautions:  Precautions Precautions: None Precaution Comments: R weakness, incoordination Restrictions Weight Bearing Restrictions: No    Therapy/Group: Individual Therapy  Beau Fanny, PT, DPT 04/14/2021, 3:45 PM

## 2021-04-14 NOTE — Progress Notes (Signed)
[place] PHYSICAL MEDICINE & REHABILITATION PROGRESS NOTE  Subjective/Complaints:  Requesting 2nd Covid 19 booster as well as flu shot Last booster in 2021 RLS improved with requip, sleeping well no bowel or bladder issues  ROS: Denies CP, SOB, N/V/D  Objective: Vital Signs: Blood pressure 134/83, pulse 81, temperature 97.7 F (36.5 C), temperature source Oral, resp. rate 17, SpO2 100 %. No results found. No results for input(s): WBC, HGB, HCT, PLT in the last 72 hours.  Recent Labs    04/13/21 0456  NA 135  K 3.8  CL 100  CO2 27  GLUCOSE 105*  BUN 21*  CREATININE 1.59*  CALCIUM 9.0      Intake/Output Summary (Last 24 hours) at 04/14/2021 0834 Last data filed at 04/13/2021 1736 Gross per 24 hour  Intake 480 ml  Output --  Net 480 ml         Physical Exam: BP 134/83 (BP Location: Right Arm)   Pulse 81   Temp 97.7 F (36.5 C) (Oral)   Resp 17   SpO2 100%   General: No acute distress Mood and affect are appropriate Heart: Regular rate and rhythm no rubs murmurs or extra sounds Lungs: Clear to auscultation, breathing unlabored, no rales or wheezes Abdomen: Positive bowel sounds, soft nontender to palpation, nondistended Extremities: No clubbing, cyanosis, or edema Skin: No evidence of breakdown, no evidence of rash Psych: Normal mood.  Normal behavior. Musc: No edema in extremities.  No tenderness in extremities. Neuro: Alert. RUE 4+/5. RLE 4+/5, stable. LUE and LLE 5/5. Right sided ataxia UE>LE  Assessment/Plan: 1. Functional deficits which require 3+ hours per day of interdisciplinary therapy in a comprehensive inpatient rehab setting. Physiatrist is providing close team supervision and 24 hour management of active medical problems listed below. Physiatrist and rehab team continue to assess barriers to discharge/monitor patient progress toward functional and medical goals   Care Tool:  Bathing    Body parts bathed by patient: Right arm, Left  arm, Chest, Abdomen, Front perineal area, Buttocks, Right upper leg, Left upper leg, Right lower leg, Left lower leg, Face         Bathing assist Assist Level: Minimal Assistance - Patient > 75%     Upper Body Dressing/Undressing Upper body dressing   What is the patient wearing?: Pull over shirt, Bra    Upper body assist Assist Level: Minimal Assistance - Patient > 75%    Lower Body Dressing/Undressing Lower body dressing      What is the patient wearing?: Pants     Lower body assist Assist for lower body dressing: Supervision/Verbal cueing     Toileting Toileting    Toileting assist Assist for toileting: Contact Guard/Touching assist     Transfers Chair/bed transfer  Transfers assist     Chair/bed transfer assist level: Supervision/Verbal cueing     Locomotion Ambulation   Ambulation assist      Assist level: Supervision/Verbal cueing Assistive device: Walker-rolling Max distance: 214f   Walk 10 feet activity   Assist     Assist level: Supervision/Verbal cueing Assistive device: Walker-rolling   Walk 50 feet activity   Assist    Assist level: Supervision/Verbal cueing Assistive device: Walker-rolling    Walk 150 feet activity   Assist Walk 150 feet activity did not occur: Safety/medical concerns (fatigue)  Assist level: Supervision/Verbal cueing Assistive device: Walker-rolling    Walk 10 feet on uneven surface  activity   Assist Walk 10 feet on uneven surfaces activity did not  occur: Safety/medical concerns         Wheelchair     Assist Is the patient using a wheelchair?: Yes Type of Wheelchair: Manual    Wheelchair assist level: Minimal Assistance - Patient > 75% Max wheelchair distance: 50    Wheelchair 50 feet with 2 turns activity    Assist        Assist Level: Minimal Assistance - Patient > 75%   Wheelchair 150 feet activity     Assist      Assist Level: Dependent - Patient 0%     Medical Problem List and Plan: 1.  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 Formulized individual treatment plan based on medical history, underlying problem and comorbidities.  2.  Impaired mobility: ambulating >225 feet, may d/c Lovenox             -antiplatelet therapy: Aspirin 81 mg daily and Plavix 75 mg day x3 weeks and aspirin alone 3. Pain Management: Tylenol as needed 4. Mood: Provide emotional support             -antipsychotic agents: N/A 5. Neuropsych: This patient is capable of making decisions on her own behalf. 6. Skin/Wound Care: Routine skin checks 7. Fluids/Electrolytes/Nutrition: Routine in and outs 8.  Hypertension.  Increase Norvasc to 53m.  Vitals:   04/13/21 1937 04/14/21 0552  BP: (!) 152/62 134/83  Pulse: 80 81  Resp: 18 17  Temp: 98.3 F (36.8 C) 97.7 F (36.5 C)  SpO2: 100% 100%  Some lability will continue to monitor on current dose 9.  New findings hypothyroidism.  Continue Synthroid.  Follow-up thyroid panel 3 to 4 weeks 10.  Admission elevated creatinine 1.59.  Suspect CKD stage III.    Creatinine 1.71 on 11/18,fairly stable at 1.59 on 11/21             -encourage adquate fluids 11.  Moderate pericardial effusion identified on echocardiogram.  Follow-up echocardiogram 12.  Morbid obesity.  BMI 42.40.  Dietary follow-up 13. Slow transit constipation:             -add scheduled miralax and senna-s             -sorbitol prn   Improving 14. Normal K+ cont to monitor  15.  ? RLS sx, received requip  1 mg qhs  LOS: 6 days A FACE TO FACE EVALUATION WAS PERFORMED  ACharlett Blake11/23/2022, 8:34 AM

## 2021-04-14 NOTE — Progress Notes (Addendum)
Patient ID: Christine Fritz, female   DOB: June 22, 1960, 60 y.o.   MRN: [id number]  Met with pt to update her on team conference and she is upset she thought she would be able to go on Sat and now being told Tuesday. Have reached out to team and MD regarding this. She can have daughter come in Friday but she will not be the person caring for her either her Mom or husband, will be and can be called to go over care needs. She is agreeable to OP therapies and will make referral and ask for them to call her to set up appointments. Await response from team.  1:17 PM Team and MD feels can move discharge to Sat 11/26. Mom is present in her room and will go through her PT session at 1;30 pm. Pt very happy about this and glad to be going home sooner. She is able to direct her care. Referral made to OP neuro for follow up

## 2021-04-14 NOTE — Progress Notes (Signed)
Occupational Therapy Session Note  Patient Details  Name: Christine Fritz MRN: [id number] Date of Birth: 01-26-1961  Today's Date: 04/14/2021 OT Individual Time: 8416-6063 OT Individual Time Calculation (min): 28 min    Short Term Goals: Week 1:  OT Short Term Goal 1 (Week 1): Pt will complete UB dressing with supervision. OT Short Term Goal 2 (Week 1): Pt will complete LB dressing with min assist. OT Short Term Goal 3 (Week 1): Pt will complete stand pivot transfer to the 3:1 with use of the RW and min guard assist. OT Short Term Goal 4 (Week 1): Pt will use the RUE at a diminshed level during selfcare tasks with supervision.  Skilled Therapeutic Interventions/Progress Updates:  Pt greeted  seated in w/c  agreeable to OT intervention. Session focus on  therapeutic activities focused on BUE strength/ coordination, sit<>stands, functional mobiltiy and increasing overall activity tolerance. Pt transported to gym with total A for time mgmt. Pt engaged in seated Zoom ball 2x1 min each to facilitate bilateral integration, improve RUE coordination and improve overall activity tolerance. Pt able to complete x15 stands with BUEs holding onto ball and raising ball OH during stand to maximize BLE strength and endurance as well as incorporate BUE coordination and functional GM movement.  Pt completed functional mobility back to room with rw and CGA >100 ft. pt left  seated in recliner with all needs within reach.                   Therapy Documentation Precautions:  Precautions Precautions: None Precaution Comments: R weakness, incoordination Restrictions Weight Bearing Restrictions: No  Pain: no pain reproted    Therapy/Group: Individual Therapy  Barron Schmid 04/14/2021, 12:10 PM

## 2021-04-14 NOTE — Discharge Summary (Signed)
Physician Discharge Summary  Patient ID: Christine Fritz MRN: [id number] DOB/AGE: 1960-06-25 60 y.o.  Admit date: 04/08/2021 Discharge date: 04/17/2021  Discharge Diagnoses:  Principal Problem:   Small vessel disease, cerebrovascular Active Problems:   Slow transit constipation   Stage 3a chronic kidney disease ([place])   Essential hypertension New findings hypothyroidism Obesity Restless leg syndrome AKI  Discharged Condition: Stable  Significant Diagnostic Studies: DG Pelvis 1-2 Views  Result Date: 04/05/2021 CLINICAL DATA:  Fall EXAM: PELVIS - 1-2 VIEW COMPARISON:  None. FINDINGS: There is no evidence of pelvic fracture or diastasis. No pelvic bone lesions are seen. IMPRESSION: Negative Electronically Signed   By: Jorje Guild M.D.   On: 04/05/2021 06:01   CT HEAD WO CONTRAST  Result Date: 04/04/2021 CLINICAL DATA:  Neuro deficit, acute, stroke suspected. Right-sided weakness. EXAM: CT HEAD WITHOUT CONTRAST TECHNIQUE: Contiguous axial images were obtained from the base of the skull through the vertex without intravenous contrast. COMPARISON:  None. 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 Mild chronic small vessel disease throughout the deep white matter. No hemorrhage or hydrocephalus. Vascular: No hyperdense vessel or unexpected calcification. Skull: No acute calvarial abnormality. 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 Chronic small vessel disease. Electronically Signed   By: Rolm Baptise M.D.   On: 04/04/2021 18:36   MR ANGIO HEAD WO CONTRAST  Result Date: 04/05/2021 CLINICAL DATA:  Follow-up examination for acute stroke. EXAM: MRA HEAD WITHOUT CONTRAST TECHNIQUE: Angiographic images of the Circle of Willis were acquired using MRA technique without intravenous contrast. COMPARISON:  Prior brain MRI from 04/04/2021 FINDINGS: Anterior circulation: Examination moderately  degraded by motion artifact. Petrous, cavernous, and supraclinoid segments of both internal carotid arteries patent without flow-limiting stenosis. A1 segments patent bilaterally. Normal anterior communicating artery complex. Left ACA patent to its distal aspect without stenosis. There is an apparent focal severe distal right A2 stenosis (series 1063, image 4). This is also seen on source time-of-flight sequence (series 5, image 110). Right ACA otherwise patent. M1 segments patent without significant stenosis. Negative MCA bifurcations. Distal MCA branches perfused and symmetric. Suspected distal small vessel atheromatous irregularity. Posterior circulation: Visualized distal V4 segments patent without stenosis. Right vertebral artery slightly dominant. Right PICA origin patent. Left PICA origin not seen on this exam. Basilar grossly patent to its distal aspect without significant stenosis. Right SCA patent. There is question of a focal severe stenosis at the origin of the left SCA (series 1051, image 4). Both PCAs primarily supplied via the basilar. PCAs patent to their distal aspects without significant stenosis. Anatomic variants: None significant. Other: No intracranial aneurysm or other vascular abnormality. IMPRESSION: 1. Motion degraded exam. 2. Negative intracranial MRA for large vessel occlusion. 3. Severe right A2 stenosis. 4. Question additional severe stenosis at the origin of the left SCA. 5. No other hemodynamically significant or correctable stenosis. Electronically Signed   By: Jeannine Boga M.D.   On: 04/05/2021 04:25   MR BRAIN WO CONTRAST  Result Date: 04/05/2021 CLINICAL DATA:  Initial evaluation for acute stroke, right-sided paresthesias for 24 hours. EXAM: MRI HEAD WITHOUT CONTRAST TECHNIQUE: Multiplanar, multiecho pulse sequences of the brain and surrounding structures were obtained without intravenous contrast. COMPARISON:  Head CT from earlier the same day. FINDINGS: Brain:  Cerebral volume within normal limits. Mild chronic microvascular ischemic disease noted involving the supratentorial cerebral white matter. Remote lacunar infarct at the right basal ganglia/corona radiata. 1 cm focus of restricted diffusion seen involving  the posterior limb of the left internal capsule, consistent with a acute ischemic infarct (series 5, image 72). No associated hemorrhage or mass effect. No other evidence for acute or subacute ischemia. Gray-white matter differentiation otherwise maintained. No acute intracranial hemorrhage. Few small chronic micro hemorrhages noted involving the left thalamus, likely hypertensive in nature. No mass lesion or mass effect. No midline shift or hydrocephalus. No extra-axial fluid collection. Pituitary gland suprasellar region within normal limits. Midline structures intact. Vascular: Abnormal flow void within the distal right vertebral artery within the neck, likely occluded (series 10, image 1). Preserved but mildly heterogeneous flow seen within the right V4 segment distally, which could be retrograde in nature. Major intracranial vascular flow voids otherwise maintained. Skull and upper cervical spine: Craniocervical junction within normal limits. Bone marrow signal intensity normal. No scalp soft tissue abnormality. Sinuses/Orbits: Globes normal soft tissues demonstrate no acute finding. Paranasal sinuses are largely clear. No significant mastoid effusion. Other: None. IMPRESSION: 1. 1 cm acute ischemic nonhemorrhagic infarct involving the posterior limb of the left internal capsule. 2. Remote lacunar infarct at the right basal ganglia/corona radiata. 3. Underlying mild chronic microvascular ischemic disease. 4. Absent flow void within the right vertebral artery within the neck, likely occluded. Electronically Signed   By: Jeannine Boga M.D.   On: 04/05/2021 01:06   MR Cervical Spine Wo Contrast  Result Date: 04/05/2021 CLINICAL DATA:  Initial  evaluation for cervical radiculopathy, right arm and leg paresthesias. EXAM: MRI CERVICAL SPINE WITHOUT CONTRAST TECHNIQUE: Multiplanar, multisequence MR imaging of the cervical spine was performed. No intravenous contrast was administered. COMPARISON:  None available. FINDINGS: Alignment: Straightening with slight reversal of the normal cervical lordosis. No listhesis. Vertebrae: Vertebral body height maintained without acute or chronic fracture. Bone marrow signal intensity within normal limits. No discrete or worrisome osseous lesions. No abnormal marrow edema. Cord: Normal signal and morphology. Posterior Fossa, vertebral arteries, paraspinal tissues: Craniocervical junction within normal limits. Paraspinous soft tissues demonstrate no acute finding. 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 Disc levels: C2-C3: Negative interspace. Mild left-sided facet hypertrophy. No stenosis. C3-C4: Mild endplate spurring without significant disc bulge. Mild facet hypertrophy. No spinal stenosis. Mild bilateral C4 foraminal narrowing. C4-C5: Mild disc bulge with uncovertebral hypertrophy. Right-sided facet degeneration. No spinal stenosis. Mild left C5 foraminal narrowing. Right neural foramina remains patent. C5-C6: Degenerative intervertebral disc space narrowing. Left paracentral disc protrusion indents the left ventral thecal sac, contacting and mildly flattening the ventral cord (series 8, image 29). Mild spinal stenosis. Superimposed uncovertebral spurring with resultant mild right C6 foraminal narrowing. Left neural foramina remains patent. C6-C7: Degenerative intervertebral disc space narrowing with diffuse disc bulge, endplate spurring, and uncovertebral hypertrophy. Broad posterior disc osteophyte flattens and partially faces the ventral thecal sac with resultant mild spinal stenosis. Foramina remain patent. C7-T1: Negative interspace. Left greater than right facet hypertrophy. No spinal  stenosis. Mild left C8 foraminal narrowing. Right neural foramina remains patent. Visualized upper thoracic spine demonstrates no significant finding. IMPRESSION: 1. Left paracentral disc protrusion at C5-6 contacting and mildly flattening the left hemi cord and resulting in mild spinal stenosis. 2. Degenerative disc osteophyte at C6-7 with resultant mild spinal stenosis. 3. Mild bilateral C4, left C5, right C6, and left C8 foraminal stenosis related to disc bulging and uncovertebral disease. 4. 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 Electronically Signed   By: Jeannine Boga M.D.   On: 04/05/2021 01:24   DG CHEST PORT 1 VIEW  Result Date: 04/05/2021 CLINICAL DATA:  Pericardial effusion. EXAM: PORTABLE CHEST 1 VIEW COMPARISON:  None. FINDINGS: Cardiac and mediastinal contours are within normal limits. No focal pulmonary opacity. No pleural effusion or pneumothorax. No acute osseous abnormality. IMPRESSION: No acute cardiopulmonary process. No definite evidence of pericardial effusion. Electronically Signed   By: Merilyn Baba M.D.   On: 04/05/2021 14:57   ECHOCARDIOGRAM COMPLETE  Result Date: 04/05/2021    ECHOCARDIOGRAM REPORT   Patient Name:   Christine Fritz Date of Exam: 04/05/2021 Medical Rec #:  [id number]     Height:       64.0 in Accession #:    [id number]    Weight:       247.0 lb Date of Birth:  1960-05-30      BSA:          2.140 m Patient Age:    18 years      BP:           155/109 mmHg Patient Gender: F             HR:           81 bpm. Exam Location:  Inpatient Procedure: 2D Echo, Cardiac Doppler and Color Doppler Indications:    Stroke  History:        Patient has no prior history of Echocardiogram examinations.                 Acute CVA.  Sonographer:    Glo Herring Referring Phys: Wiley  Sonographer Comments: Technically difficult study due to poor echo windows. IMPRESSIONS  1. Left ventricular ejection fraction, by estimation, is 60  to 65%. The left ventricle has normal function. The left ventricle has no regional wall motion abnormalities. There is severe concentric left ventricular hypertrophy. Left ventricular diastolic  parameters are indeterminate.  2. Right ventricular systolic function is normal. The right ventricular size is normal. Moderately increased right ventricular wall thickness.  3. Moderate pericardial effusion, located anterior of the right venticle. The right ventricle does not expand fully suggesting increased pericardial pressure but there is no evidence of tamponade. 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  4. The mitral valve is normal in structure. No evidence of mitral valve regurgitation. No evidence of mitral stenosis.  5. The aortic valve is normal in structure. Aortic valve regurgitation is not visualized. No aortic stenosis is present.  6. The inferior vena cava is normal in size with greater than 50% respiratory variability, suggesting right atrial pressure of 3 mmHg. Conclusion(s)/Recommendation(s): Due to findings of Moderate pericardial effusion with no evidence of tamponande, a follow up echocardiogram is recommended in prior to discharge. FINDINGS  Left Ventricle: Left ventricular ejection fraction, by estimation, is 60 to 65%. The left ventricle has normal function. The left ventricle has no regional wall motion abnormalities. The left ventricular internal cavity size was normal in size. There is  severe concentric left ventricular hypertrophy. Left ventricular diastolic parameters are indeterminate. Right Ventricle: The right ventricular size is normal. Moderately increased right ventricular wall thickness. Right ventricular systolic function is normal. Left Atrium: Left atrial size was normal in size. Right Atrium: Right atrial size was normal in size. Pericardium: The right ventricle does not expand fully suggesting increased pericardial pressure but there  is no evidence of tamponade. 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 A moderately sized pericardial effusion is present.  The pericardial effusion is anterior to the right ventricle. Presence of epicardial fat layer. Mitral Valve: The mitral valve is normal in structure. No evidence of mitral valve regurgitation. No evidence of mitral valve stenosis. Tricuspid Valve: The tricuspid valve is normal in structure. Tricuspid valve regurgitation is not demonstrated. No evidence of tricuspid stenosis. Aortic Valve: The aortic valve is normal in structure. Aortic valve regurgitation is not visualized. No aortic stenosis is present. Aortic valve mean gradient measures 4.0 mmHg. Aortic valve peak gradient measures 6.7 mmHg. Aortic valve area, by VTI measures 2.53 cm. Pulmonic Valve: The pulmonic valve was normal in structure. Pulmonic valve regurgitation is not visualized. No evidence of pulmonic stenosis. Aorta: The aortic root is normal in size and structure. Venous: The inferior vena cava is normal in size with greater than 50% respiratory variability, suggesting right atrial pressure of 3 mmHg. IAS/Shunts: No atrial level shunt detected by color flow Doppler.  LEFT VENTRICLE PLAX 2D LVIDd:         3.50 cm   Diastology LVIDs:         2.50 cm   LV e' medial:    5.33 cm/s LV PW:         1.70 cm   LV E/e' medial:  11.7 LV IVS:        1.70 cm   LV e' lateral:   3.81 cm/s LVOT diam:     1.90 cm   LV E/e' lateral: 16.4 LV SV:         68 LV SV Index:   32 LVOT Area:     2.84 cm  IVC IVC diam: 1.60 cm LEFT ATRIUM             Index LA diam:        3.40 cm 1.59 cm/m LA Vol (A2C):   38.8 ml 18.13 ml/m LA Vol (A4C):   33.3 ml 15.56 ml/m LA Biplane Vol: 37.6 ml 17.57 ml/m  AORTIC VALVE                    PULMONIC VALVE AV Area (Vmax):    3.03 cm     PV Vmax:       1.22 m/s AV Area (Vmean):   2.74 cm     PV Peak grad:  6.0 mmHg AV Area (VTI):     2.53 cm AV Vmax:           129.00  cm/s AV Vmean:          91.400 cm/s AV VTI:            0.268 m AV Peak Grad:      6.7 mmHg AV Mean Grad:      4.0 mmHg LVOT Vmax:         138.00 cm/s LVOT Vmean:        88.200 cm/s LVOT VTI:          0.239 m LVOT/AV VTI ratio: 0.89  AORTA Ao Root diam: 3.20 cm Ao Asc diam:  3.30 cm MITRAL VALVE MV Area (PHT): 4.83 cm    SHUNTS MV Decel Time: 157 msec    Systemic VTI:  0.24 m MV E velocity: 62.40 cm/s  Systemic Diam: 1.90 cm MV A velocity: 84.80 cm/s MV E/A ratio:  0.74 Kardie Tobb DO Electronically signed by Berniece Salines DO Signature Date/Time: 04/05/2021/12:48:59 PM    Final    VAS US CAROTID (at [facility] and [facility] only)  Result Date: 04/07/2021  Carotid Arterial Duplex Study Patient Name:  Christine Fritz  Date of Exam:   04/05/2021 Medical Rec #: [id number]      Accession #:    [id number] Date of Birth: Nov 08, 1960       Patient Gender: F Patient Age:   43 years Exam Location:  [facility] Procedure:      VAS US CAROTID Referring Phys: Gean Birchwood --------------------------------------------------------------------------------  Indications:       CVA. Comparison Study:  no prior Performing Technologist: Archie Patten RVS  Examination Guidelines: A complete evaluation includes B-mode imaging, spectral Doppler, color Doppler, and power Doppler as needed of all accessible portions of each vessel. Bilateral testing is considered an integral part of a complete examination. Limited examinations for reoccurring indications may be performed as noted.  Right Carotid Findings: +----------+--------+--------+--------+------------------+--------+           PSV cm/sEDV cm/sStenosisPlaque DescriptionComments +----------+--------+--------+--------+------------------+--------+ CCA Prox  50      12              heterogenous               +----------+--------+--------+--------+------------------+--------+ CCA Distal65      14              heterogenous                +----------+--------+--------+--------+------------------+--------+ ICA Prox  51      18      1-39%   heterogenous               +----------+--------+--------+--------+------------------+--------+ ICA Distal87      26                                         +----------+--------+--------+--------+------------------+--------+ ECA       261     17                                         +----------+--------+--------+--------+------------------+--------+ +----------+--------+-------+--------+-------------------+           PSV cm/sEDV cmsDescribeArm Pressure (mmHG) +----------+--------+-------+--------+-------------------+ [id number]                                        +----------+--------+-------+--------+-------------------+ +---------+--------+--+--------+-+---------+ VertebralPSV cm/s37EDV cm/s9Antegrade +---------+--------+--+--------+-+---------+  Left Carotid Findings: +----------+--------+--------+--------+------------------+--------+           PSV cm/sEDV cm/sStenosisPlaque DescriptionComments +----------+--------+--------+--------+------------------+--------+ CCA Prox  87      13              heterogenous               +----------+--------+--------+--------+------------------+--------+ CCA Distal67      12              heterogenous               +----------+--------+--------+--------+------------------+--------+ ICA Prox  44      14      1-39%   heterogenous               +----------+--------+--------+--------+------------------+--------+ ICA Distal87      26                                         +----------+--------+--------+--------+------------------+--------+  ECA       81      6                                          +----------+--------+--------+--------+------------------+--------+ +----------+--------+--------+--------+-------------------+           PSV cm/sEDV cm/sDescribeArm Pressure (mmHG)  +----------+--------+--------+--------+-------------------+ [id number]                                         +----------+--------+--------+--------+-------------------+ +---------+--------+--+--------+--+---------+ VertebralPSV cm/s39EDV cm/s13Antegrade +---------+--------+--+--------+--+---------+   Summary: Right Carotid: Velocities in the right ICA are consistent with a 1-39% stenosis. Left Carotid: Velocities in the left ICA are consistent with a 1-39% stenosis. Vertebrals: Bilateral vertebral arteries demonstrate antegrade flow. *See table(s) above for measurements and observations.  Electronically signed by Delia Heady MD on 04/07/2021 at 8:22:48 AM.    Final    ECHOCARDIOGRAM LIMITED  Result Date: 04/08/2021    ECHOCARDIOGRAM LIMITED REPORT   Patient Name:   Christine Fritz Date of Exam: 04/08/2021 Medical Rec #:  [id number]     Height:       64.0 in Accession #:    [id number]    Weight:       247.0 lb Date of Birth:  [birth year]      BSA:          2.140 m Patient Age:    60 years      BP:           157/97 mmHg Patient Gender: F             HR:           77 bpm. Exam Location:  Inpatient Procedure: Limited Echo Indications:    Pericardial Effusion  History:        Patient has prior history of Echocardiogram examinations, most                 recent 04/05/2021. Risk Factors:Hypertension.  Sonographer:    Alvera Novel Referring Phys: [id number] RAMESH KC IMPRESSIONS  1. Left ventricular ejection fraction, by estimation, is 60 to 65%. The left ventricle has normal function. The left ventricle has no regional wall motion abnormalities. There is severe concentric left ventricular hypertrophy.  2. Moderate pericardial effusion. The pericardial effusion is anterior to the right ventricle. Comparison(s): No significant change from prior study. Prior images reviewed side by side. Conclusion(s)/Recommendation(s): Images reviewed side by side, unchanged from prior. There is a moderate pericardial  effusion predominantly located anterior to the right ventricle. While there is some respiratory variation of the mitral inflow, there is no RV diastolic collapse. RA not well seen. No definitive echo evidence of tamponade, recommend clinical correlation. FINDINGS  Left Ventricle: Left ventricular ejection fraction, by estimation, is 60 to 65%. The left ventricle has normal function. The left ventricle has no regional wall motion abnormalities. There is severe concentric left ventricular hypertrophy. Pericardium: A moderately sized pericardial effusion is present. The pericardial effusion is anterior to the right ventricle. Presence of epicardial fat layer. LEFT VENTRICLE PLAX 2D LVIDd:         3.70 cm LVIDs:         2.60 cm LV PW:         1.90 cm LV IVS:  1.80 cm  LV Volumes (MOD) LV vol d, MOD A2C: 46.3 ml LV vol d, MOD A4C: 71.0 ml LV vol s, MOD A2C: 19.0 ml LV vol s, MOD A4C: 31.8 ml LV SV MOD A2C:     27.3 ml LV SV MOD A4C:     71.0 ml LV SV MOD BP:      33.0 ml Buford Dresser MD Electronically signed by Buford Dresser MD Signature Date/Time: 04/08/2021/12:43:11 PM    Final     Labs:  Basic Metabolic Panel: Recent Labs  Lab 04/09/21 0552 04/13/21 0456  NA 137 135  K 3.8 3.8  CL 103 100  CO2 26 27  GLUCOSE 99 105*  BUN 22* 21*  CREATININE 1.71* 1.59*  CALCIUM 9.3 9.0    CBC: Recent Labs  Lab 04/09/21 0552  WBC 7.2  NEUTROABS 4.6  HGB 12.6  HCT 38.5  MCV 91.7  PLT 221    CBG: No results for input(s): GLUCAP in the last 168 hours.  Family history.  Father with hypertension.  Negative for cerebrovascular accident esophageal cancer or stomach cancer  Brief HPI:   Christine Fritz is a 60 y.o. right-handed female with unremarkable past medical history except obesity with BMI 42.40 she has not seen a doctor in 20 years.  Per chart review lives with spouse independent prior to admission.  Presented 04/04/2021 with transient right side weakness.  CT/MRI showed a 1 cm  acute ischemic nonhemorrhagic infarct involving the posterior limb of the left internal capsule.  Remote lacunar infarct in the right basal ganglia/corona radiata.  MRI cervical spine left paracentral disc protrusion C5-6 contacting and mildly flattening the left hemicord resulting in mild spinal stenosis.  MRI negative for large vessel occlusion.  Noted severe right A2 stenosis.  Admission chemistries unremarkable except TSH 219.795 creatinine 1.59 urine drug screen negative.  Echocardiogram with ejection fraction of 60 to 65% moderate pericardial effusion no wall motion abnormalities.  Placed on aspirin 81 mg daily and Plavix 75 mg day x3 weeks and aspirin alone for CVA prophylaxis.  Subcutaneous Lovenox for DVT prophylaxis.  Placed on Synthroid for findings of elevated TSH.  Therapy evaluations completed due to patient's right side weakness decreased functional mobility was admitted for a comprehensive rehab program.   Hospital Course: Christine Fritz was admitted to rehab 04/08/2021 for inpatient therapies to consist of PT, ST and OT at least three hours five days a week. Past admission physiatrist, therapy team and rehab RN have worked together to provide customized collaborative inpatient rehab.  Pertaining to patient's left posterior limb internal capsule infarction secondary small vessel disease she will continue on aspirin and Plavix x3 weeks and aspirin alone.  Subtenons Lovenox for DVT prophylaxis discontinued ambulating greater than 225 feet.  She would follow-up neurology services.  Blood pressure controlled on Norvasc he would need outpatient follow-up.  New findings hypothyroidism Synthroid initiated she would need follow-up thyroid panel in 3 to 4 weeks.  Admission chemistries AKI with latest creatinine 1.59 stable follow-up outpatient.  Noted obesity BMI 42.40 dietary follow-up.  She did have some restless legs maintained on Requip.   Blood pressures were monitored on TID basis and  controlled    Rehab course: During patient's stay in rehab weekly team conferences were held to monitor patient's progress, set goals and discuss barriers to discharge. At admission, patient required min mod assist 45 feet rolling walker moderate assist stand pivot transfer  Physical exam.  Blood pressure 141/90 pulse 75  temperature 96 respirations 16 oxygen saturation 97% room air Constitutional.  No acute distress HEENT Head.  Normocephalic and atraumatic Eyes.  Pupils round and reactive to light no discharge.nystagmus Neck.  Supple nontender no JVD without thyromegaly Cardiac regular rate and rhythm any extra sounds or murmur heard Abdomen.  Soft nontender positive bowel sounds without rebound Respiratory effort normal no respiratory distress without wheeze Skin.  Warm and dry Neurologic.  Awake alert makes eye contact with examiner.  Follows simple commands.  Fair insight and awareness.  Speech slightly dysarthric.  No focal CN abnormalities although gaze slightly disconjugate at times.  Right upper extremity 4+/5 right lower extremity 4+/5 left upper extremity left lower extremity 5/5.  Senses light touch in all fours.  He/She  has had improvement in activity tolerance, balance, postural control as well as ability to compensate for deficits. He/She has had improvement in functional use RUE/LUE  and RLE/LLE as well as improvement in awareness.  Ambulating 100 feet without assistive device some decreasing cadence.  She could increase her ambulation up to 200 feet rolling walker contact-guard.  She can gather her belongings for activities day living homemaking.  Ambulates to the bathroom close supervision.  Full family teaching completed plan discharged home       Disposition: Discharged to home   Diet: Regular  Special Instructions: No driving smoking or alcohol  Follow-up outpatient thyroid panel  Medications at discharge. 1.  Tylenol as needed 2.  Norvasc 10 mg daily 3.   Lipitor 80 mg p.o. daily 4.  Synthroid 100 mcg p.o. daily 5.  Requip 1 mg p.o. nightly 6.  MiraLAX daily 7.  Aspirin 81 mg p.o. daily 8.  Plavix 75 mg daily until 04/26/2021 and stop 9.  Magnesium oxide 400 mg daily  30-35 minutes were spent completing discharge summary and discharge planning  Discharge Instructions     Ambulatory referral to Neurology   Complete by: As directed    An appointment is requested in approximately: 4 weeks left posterior limb internal capsule infarction   Ambulatory referral to Physical Medicine Rehab   Complete by: As directed    Moderate complexity follow-up 1 to 2 weeks left posterior limb internal capsule infarction        Follow-up Information     Raulkar, Clide Deutscher, MD Follow up.   Specialty: Physical Medicine and Rehabilitation Why: 07/20/21 please arrive at 1:00pm for 1:20pm follow-up appointment, thank you! Contact information: [id number] [place] [address] [phone]         Carol Ada, MD Follow up.   Specialty: Family Medicine Contact information: [address] [place] [address] [phone]                 Signed: Lavon Paganini Edison 04/16/2021, 5:30 AM

## 2021-04-14 NOTE — Progress Notes (Signed)
Physical Therapy Session Note  Patient Details  Name: Christine Fritz MRN: [id number] Date of Birth: [birth year]  Today's Date: 04/14/2021 PT Individual Time: 0950-1100 + 1500-1525 PT Individual Time Calculation (min): 70 min  + 25 min  Short Term Goals: Week 1:  PT Short Term Goal 1 (Week 1): pt will complete bed mobility with CGA PT Short Term Goal 2 (Week 1): Pt will complete bed<>chair transfers with CGA and LRAD PT Short Term Goal 3 (Week 1): Pt will ambulate 114f with CGA and LRAD PT Short Term Goal 4 (Week 1): Pt will navigate up/down x4 steps with minA and 2 hand rails  Skilled Therapeutic Interventions/Progress Updates:      1st session: Pt seated in recliner to start session. Agreeable to PT tx wtihout reports of pain. Sit<>stand to RW with supervision and completes short distance ambulatory transfer with supervision and RW. Transported downstairs to day room rehab gym for time.  Instructed in gait training - ambulated ~1648fwith close supervision and RW and then ~16577fith CGA/minA and no AD. Pt not wearing her tennis shoes but hospital socks - demnstrates appropriate R foot clearance without evidence of knee buckling. Increased unsteadiness with turns, especially towards the L. VC throughout for slowing down speed for safety, keeping body within walker frame, and normalizing gait pattern.   Instructed pt in BERG balance test with results described below. Scored 37/56 placing her at significant falls risk. Results relayed to patient and reinforced importance of using RW at all times at home.  Patient demonstrates increased fall risk as noted by score of 37/56 on Berg Balance Scale.  (<36= high risk for falls, close to 100%; 37-45 significant >80%; 46-51 moderate >50%; 52-55 lower >25%)  TUG x3 trials with supervision and RW: 1) 24 seconds 2) 21 seconds 3) 19 seconds AVG = 21.33 *Scores > 13.5 seconds indicate increased falls risk.  Pt ambulated from day room to main rehab  gym, ~150f33fith close supervision and RW. Instructed in stair training. Navigated up/down x4 steps with 1 hand rail forward facing with minA and then navigated up/down 8 steps with 1 hand rail side stepping with BUE support to rail. Required VC for sequencing with stronger foot up and weaker foot down. She also required cues to avoid overcrowding feet during side stepping and being more aware of foot placement to avoid falls.   Pt completed car transfer at supervision level with RW with car height simulated to her CR-V. Able to manage LE's in/out of car without assist.   Transported upstairs to her room in w/c for time and energy conservation as pt reporting RLE fatigue. Pt assisted back to her recliner with supervision ambulatory transfer using RW. Remained seated in recliner with all needs met.   2nd session: Pt seated in recliner to start session - agreeable without reports of pain. Asked if she would like this therapist to call her mother for ed but pt pleasantly denies, stating she feels confident and comfortable with upcoming DC plan. Reviewed fall prevention, home safety, and DC Planning throughout session. Pt with tennis shoes on that her mother brought in, new sketchers. Sit<>stand to RW with supervision. Ambulates from her room do 5N waiting room, ~350ft87fth CGA and RW. Intermittent R toe drag after 150ft 19fait. VC for increased R heel strike and widening R foot placement, slightly, to improve balance. Seated rest break prior to ambulating back to her room, same distance and in similar manner as above. She concluded  session seated in recliner with chair alarm on, all needs within reach.   Therapy Documentation Precautions:  Precautions Precautions: None Precaution Comments: R weakness, incoordination Restrictions Weight Bearing Restrictions: No General:    Balance: Balance Balance Assessed: Yes Standardized Balance Assessment Standardized Balance Assessment: Berg Balance  Test Berg Balance Test Sit to Stand: Able to stand without using hands and stabilize independently Standing Unsupported: Able to stand safely 2 minutes Sitting with Back Unsupported but Feet Supported on Floor or Stool: Able to sit safely and securely 2 minutes Stand to Sit: Sits safely with minimal use of hands Transfers: Able to transfer safely, definite need of hands Standing Unsupported with Eyes Closed: Able to stand 10 seconds safely Standing Ubsupported with Feet Together: Able to place feet together independently and stand for 1 minute with supervision From Standing, Reach Forward with Outstretched Arm: Can reach forward >12 cm safely (5") From Standing Position, Pick up Object from Floor: Able to pick up shoe, needs supervision From Standing Position, Turn to Look Behind Over each Shoulder: Needs assist to keep from losing balance and falling Turn 360 Degrees: Needs assistance while turning Standing Unsupported, Alternately Place Feet on Step/Stool: Able to complete >2 steps/needs minimal assist Standing Unsupported, One Foot in Front: Able to plae foot ahead of the other independently and hold 30 seconds Standing on One Leg: Tries to lift leg/unable to hold 3 seconds but remains standing independently Total Score: 37/56  Therapy/Group: Individual Therapy  Dafney Farler P Dilara Navarrete PT 04/14/2021, 7:50 AM

## 2021-04-15 DIAGNOSIS — I679 Cerebrovascular disease, unspecified: Secondary | ICD-10-CM | POA: Diagnosis not present

## 2021-04-15 NOTE — Patient Care Conference (Signed)
Inpatient RehabilitationTeam Conference and Plan of Care Update Date: 04/14/2021   Time: 11:12 AM    Patient Name: Christine Fritz      Medical Record Number: [id number]  Date of Birth: 1961-05-23 Sex: Female         Room/Bed: 5C08C/5C08C-01 Payor Info: Payor: [employer] / Plan: BC[employer] PPO / Product Type: *No Product type* /    Admit Date/Time:  04/08/2021  4:01 PM  Primary Diagnosis:  Small vessel disease, cerebrovascular  Hospital Problems: Principal Problem:   Small vessel disease, cerebrovascular Active Problems:   Slow transit constipation   Stage 3a chronic kidney disease (HCC)   Essential hypertension    Expected Discharge Date: Expected Discharge Date: 04/17/21  Team Members Present: Physician leading conference: Dr. Claudette Laws Social Worker Present: Dossie Der, LCSW Nurse Present: Chana Bode, RN PT Present: Wynelle Link, PT OT Present: Lina Sayre, OT SLP Present: Elio Forget, SLP PPS Coordinator present : Fae Pippin, SLP     Current Status/Progress Goal Weekly Team Focus  Bowel/Bladder   cont of b&b  remain continent  assess q shift and prn   Swallow/Nutrition/ Hydration             ADL's   supervision- CGA functional mobility using RW; min assist overall self care; mild ataxia RUE  independent-supervision  neuro re-ed, functional transfers, endurance, balance, self care training   Mobility   supervision bed mobility, supervision sit<>stand to RW, CGA gait >151ft with RW, CGA 12 steps with 2 hand rails  Supervision  RLE NMR, balance training, gait training, DC planning   Communication             Safety/Cognition/ Behavioral Observations            Pain   0 out 10  remain pain free  assess q shift and prn   Skin   no skin issues  remain breakdown free  assess q shift and prn     Discharge Planning:  Home with husband who works but can have Mom come or go to [employer] home if supervision needed. Would  benefit from OP therpaies   Team Discussion: Medically stable. Patient is very motivated and participatory. Progression limited by balance issues, right lower extremity weakness and poor proprioception.  Patient on target to meet rehab goals: yes, currently requires min assist for bathing and CGA - supervision for mobility. Completes sit - stand with supervision, ambulates 150' w CGA and able to manage steps with min assist. Goals for discharge set for supervision overall.   *See Care Plan and progress notes for long and short-term goals.   Revisions to Treatment Plan:  SLP evaluated and signed off services   Teaching Needs: Safety, medications, secondary risk management, etc.  Current Barriers to Discharge: Decreased caregiver support and Insurance for SNF coverage or HH services  Possible Resolutions to Barriers: Family education with spouse and mother  OP follow up services recommended     Medical Summary Current Status: Good motivation, No RLS             I attest that I was present, lead the team conference, and concur with the assessment and plan of the team.   Chana Bode B 04/15/2021, 9:09 AM

## 2021-04-15 NOTE — Progress Notes (Signed)
[facility] PHYSICAL MEDICINE & REHABILITATION PROGRESS NOTE  Subjective/Complaints:  Received covid booster yesterday , awaiting flu shot today   ROS: Denies CP, SOB, N/V/D  Objective: Vital Signs: Blood pressure 131/88, pulse 79, temperature 97.6 F (36.4 C), temperature source Oral, resp. rate 16, SpO2 95 %. No results found. No results for input(s): WBC, HGB, HCT, PLT in the last 72 hours.  Recent Labs    04/13/21 0456  NA 135  K 3.8  CL 100  CO2 27  GLUCOSE 105*  BUN 21*  CREATININE 1.59*  CALCIUM 9.0      Intake/Output Summary (Last 24 hours) at 04/15/2021 0904 Last data filed at 04/15/2021 [id number] Gross per 24 hour  Intake 838 ml  Output --  Net 838 ml         Physical Exam: BP 131/88 (BP Location: Right Arm)   Pulse 79   Temp 97.6 F (36.4 C) (Oral)   Resp 16   SpO2 95%    General: No acute distress Mood and affect are appropriate Heart: Regular rate and rhythm no rubs murmurs or extra sounds Lungs: Clear to auscultation, breathing unlabored, no rales or wheezes Abdomen: Positive bowel sounds, soft nontender to palpation, nondistended Extremities: No clubbing, cyanosis, or edema Skin: No evidence of breakdown, no evidence of rash   Neuro: Alert. RUE 4+/5. RLE 4+/5, stable. LUE and LLE 5/5. Right sided ataxia UE>LE  Assessment/Plan: 1. Functional deficits which require 3+ hours per day of interdisciplinary therapy in a comprehensive inpatient rehab setting. Physiatrist is providing close team supervision and 24 hour management of active medical problems listed below. Physiatrist and rehab team continue to assess barriers to discharge/monitor patient progress toward functional and medical goals   Care Tool:  Bathing    Body parts bathed by patient: Right arm, Left arm, Chest, Abdomen, Front perineal area, Buttocks, Right upper leg, Left upper leg, Right lower leg, Left lower leg, Face         Bathing assist Assist Level: Minimal Assistance  - Patient > 75%     Upper Body Dressing/Undressing Upper body dressing   What is the patient wearing?: Pull over shirt, Bra    Upper body assist Assist Level: Minimal Assistance - Patient > 75%    Lower Body Dressing/Undressing Lower body dressing      What is the patient wearing?: Pants     Lower body assist Assist for lower body dressing: Supervision/Verbal cueing     Toileting Toileting    Toileting assist Assist for toileting: Contact Guard/Touching assist     Transfers Chair/bed transfer  Transfers assist     Chair/bed transfer assist level: Contact Guard/Touching assist     Locomotion Ambulation   Ambulation assist      Assist level: Supervision/Verbal cueing Assistive device: Walker-rolling Max distance: 271ft   Walk 10 feet activity   Assist     Assist level: Supervision/Verbal cueing Assistive device: Walker-rolling   Walk 50 feet activity   Assist    Assist level: Supervision/Verbal cueing Assistive device: Walker-rolling    Walk 150 feet activity   Assist Walk 150 feet activity did not occur: Safety/medical concerns (fatigue)  Assist level: Supervision/Verbal cueing Assistive device: Walker-rolling    Walk 10 feet on uneven surface  activity   Assist Walk 10 feet on uneven surfaces activity did not occur: Safety/medical concerns         Wheelchair     Assist Is the patient using a wheelchair?: Yes Type of Wheelchair:  Manual    Wheelchair assist level: Minimal Assistance - Patient > 75% Max wheelchair distance: 50    Wheelchair 50 feet with 2 turns activity    Assist        Assist Level: Minimal Assistance - Patient > 75%   Wheelchair 150 feet activity     Assist      Assist Level: Dependent - Patient 0%    Medical Problem List and Plan: 1.  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  Impaired mobility: ambulating >225 feet, may d/c Lovenox             -antiplatelet therapy: Aspirin 81 mg daily and Plavix 75 mg day x3 weeks and aspirin alone 3. Pain Management: Tylenol as needed 4. Mood: Provide emotional support             -antipsychotic agents: N/A 5. Neuropsych: This patient is capable of making decisions on her own behalf. 6. Skin/Wound Care: Routine skin checks 7. Fluids/Electrolytes/Nutrition: Routine in and outs 8.  Hypertension.  Increase Norvasc to 10mg .  Vitals:   04/14/21 2052 04/15/21 0458  BP: 133/80 131/88  Pulse: 79 79  Resp: 16 16  Temp: 98 F (36.7 C) 97.6 F (36.4 C)  SpO2: 100% 95%  Controlled 11/24 9.  New findings hypothyroidism.  Continue Synthroid.  Follow-up thyroid panel 3 to 4 weeks 10.  Admission elevated creatinine 1.59.  Suspect CKD stage III.    Creatinine 1.71 on 11/18,fairly stable at 1.59 on 11/21             -encourage adquate fluids 11.  Moderate pericardial effusion identified on echocardiogram.  Follow-up echocardiogram 12.  Morbid obesity.  BMI 42.40.  Dietary follow-up 13. Slow transit constipation:             -add scheduled miralax and senna-s             -sorbitol prn   Improving 14. Normal K+ cont to monitor  15.  ? RLS sx, received requip  1 mg qhs  LOS: 7 days A FACE TO FACE EVALUATION WAS PERFORMED  12/21 04/15/2021, 9:04 AM

## 2021-04-16 DIAGNOSIS — I679 Cerebrovascular disease, unspecified: Secondary | ICD-10-CM | POA: Diagnosis not present

## 2021-04-16 DIAGNOSIS — I639 Cerebral infarction, unspecified: Secondary | ICD-10-CM | POA: Diagnosis not present

## 2021-04-16 NOTE — Discharge Summary (Signed)
Physical Therapy Discharge Summary  Patient Details  Name: Christine Fritz MRN: [id number] Date of Birth: 1961-01-15  Patient has met 6 of 8 long term goals due to improved activity tolerance, improved balance, improved postural control, increased strength, ability to compensate for deficits, functional use of  right upper extremity and right lower extremity, and improved coordination.  Patient to discharge at an ambulatory level Supervision.   Patient's care partner is independent to provide the necessary physical assistance at discharge.  Reasons goals not met: Pt required supervision for bed<>chair transfers and CGA for stair negotiation for safety concerns.   Recommendation:  Patient will benefit from ongoing skilled PT services in outpatient setting to continue to advance safe functional mobility, address ongoing impairments in RLE NMR, dynamic standing balance, gait impairments, fall prevention, and minimize fall risk.  Equipment: Front wheeled rolling walker  Reasons for discharge: treatment goals met and discharge from hospital  Patient/family agrees with progress made and goals achieved: Yes  PT Discharge Precautions/Restrictions Precautions Precautions: Fall Precaution Comments: R weakness, incoordination Restrictions Weight Bearing Restrictions: No Pain Pain Assessment Pain Scale: 0-10 Pain Score: 0-No pain Faces Pain Scale: No hurt Pain Interference Pain Interference Pain Effect on Sleep: 0. Does not apply - I have not had any pain or hurting in the past 5 days Pain Interference with Therapy Activities: 0. Does not apply - I have not received rehabilitationtherapy in the past 5 days Pain Interference with Day-to-Day Activities: 1. Rarely or not at all Vision/Perception  Vision - History Ability to See in Adequate Light: 0 Adequate Vision - Assessment Ocular Range of Motion: Within Functional Limits Alignment/Gaze Preference: Within Defined Limits Tracking/Visual  Pursuits: Able to track stimulus in all quads without difficulty Convergence: Within functional limits Perception Perception: Within Functional Limits Praxis Praxis: Intact  Cognition Overall Cognitive Status: Within Functional Limits for tasks assessed Arousal/Alertness: Awake/alert Orientation Level: Oriented X4 Attention: Sustained Sustained Attention: Appears intact Memory: Appears intact Awareness: Appears intact Problem Solving: Appears intact Safety/Judgment: Appears intact Sensation Sensation Light Touch: Appears Intact Hot/Cold: Appears Intact Proprioception: Impaired by gross assessment Stereognosis: Appears Intact Additional Comments: Mild RLE proprioceptive impairment in functional mobility Coordination Gross Motor Movements are Fluid and Coordinated: No Fine Motor Movements are Fluid and Coordinated: No Heel Shin Test: WFL, slowed on R Motor  Motor Motor: Hemiplegia Motor - Discharge Observations: R hemibody weakness, improved distal to proximal since date of evaluation  Mobility Bed Mobility Bed Mobility: Rolling Right;Rolling Left;Supine to Sit;Sit to Supine Rolling Right: Independent Rolling Left: Independent Supine to Sit: Independent with assistive device Sit to Supine: Independent with assistive device Transfers Transfers: Sit to Stand;Stand to Sit;Stand Pivot Transfers Sit to Stand: Independent with assistive device Stand to Sit: Independent with assistive device Stand Pivot Transfers: Supervision/Verbal cueing Stand Pivot Transfer Details: Verbal cues for precautions/safety;Verbal cues for safe use of DME/AE Transfer (Assistive device): Rolling walker Locomotion  Gait Ambulation: Yes Gait Assistance: Supervision/Verbal cueing Gait Distance (Feet): 200 Feet Assistive device: Rolling walker Gait Assistance Details: Verbal cues for gait pattern;Verbal cues for precautions/safety;Verbal cues for technique;Verbal cues for safe use of  DME/AE Gait Gait: Yes Gait Pattern: Impaired Gait Pattern: Step-through pattern;Decreased step length - right;Decreased stance time - right;Decreased dorsiflexion - right;Decreased weight shift to right;Narrow base of support;Poor foot clearance - right Gait velocity: 0.67 m/s Stairs / Additional Locomotion Stairs: Yes Stairs Assistance: Contact Guard/Touching assist Stair Management Technique: One rail Left Number of Stairs: 12 Height of Stairs: 6 Wheelchair Mobility Wheelchair Mobility: No  Trunk/Postural Assessment  Cervical Assessment Cervical Assessment: Exceptions to [facility] (forward head) Thoracic Assessment Thoracic Assessment: Exceptions to [facility] (rounded shoulders) Lumbar Assessment Lumbar Assessment: Exceptions to [facility] (flexible posterior pelvic tilt in sitting) Postural Control Postural Control: Within Functional Limits  Balance Balance Balance Assessed: Yes Standardized Balance Assessment Standardized Balance Assessment: Berg Balance Test;Timed Up and Go Test Berg Balance Test Sit to Stand: Able to stand without using hands and stabilize independently Standing Unsupported: Able to stand safely 2 minutes Sitting with Back Unsupported but Feet Supported on Floor or Stool: Able to sit safely and securely 2 minutes Stand to Sit: Sits safely with minimal use of hands Transfers: Able to transfer safely, definite need of hands Standing Unsupported with Eyes Closed: Able to stand 10 seconds safely Standing Ubsupported with Feet Together: Able to place feet together independently and stand for 1 minute with supervision From Standing, Reach Forward with Outstretched Arm: Can reach forward >12 cm safely (5") From Standing Position, Pick up Object from Floor: Able to pick up shoe, needs supervision From Standing Position, Turn to Look Behind Over each Shoulder: Needs assist to keep from losing balance and falling Turn 360 Degrees: Needs assistance while turning Standing Unsupported,  Alternately Place Feet on Step/Stool: Able to complete >2 steps/needs minimal assist Standing Unsupported, One Foot in Front: Able to plae foot ahead of the other independently and hold 30 seconds Standing on One Leg: Tries to lift leg/unable to hold 3 seconds but remains standing independently Total Score: 37/56  Timed Up and Go Test TUG: Normal TUG Normal TUG (seconds): 21.3 (avg of 3 trials with supervision and RW)  Extremity Assessment      RLE Assessment RLE Assessment: Exceptions to [facility] General Strength Comments: Hip flex 3+/5, knee ext 4/5, ankle DF 4-/5 LLE Assessment LLE Assessment: Within Functional Limits General Strength Comments: Grossly 5/5    Alexzavier Girardin P Kambri Dismore PT, DPT 04/16/2021, 7:50 AM

## 2021-04-16 NOTE — Progress Notes (Signed)
Physical Therapy Session Note  Patient Details  Name: Christine Fritz MRN: [id number] Date of Birth: Nov 29, 1960  Today's Date: 04/16/2021 PT Individual Time: 1000-1045 + 1415-1526 PT Individual Time Calculation (min): 45 min  + 71 min  Short Term Goals: Week 1:  PT Short Term Goal 1 (Week 1): pt will complete bed mobility with CGA PT Short Term Goal 2 (Week 1): Pt will complete bed<>chair transfers with CGA and LRAD PT Short Term Goal 3 (Week 1): Pt will ambulate 143f with CGA and LRAD PT Short Term Goal 4 (Week 1): Pt will navigate up/down x4 steps with minA and 2 hand rails  Skilled Therapeutic Interventions/Progress Updates:     1st session: Pt greeted seated in recliner, agreeable to PT session. No reports of pain. Sit<>stand to RW with mod I. Ambulatory transfer completed with RW and supervision. Transported in w/c downstairs to 56M rehab gym. Completed "warm up" walk of ~1563fwith supervision and RW with VC needed for R foot clearance due to intermittent catching of R foot. Pt instructed in 6MWT. She covered a total of 6838fith RW and CGA - no standing rest breaks but pt began to c/o RLE fatigue after ~400f4fith fear of knee buckling. Age norm for 6MWT is 1765 ft. Reviewed tub transfers with use of grab bar and minA for balance. Spent time discussing equipment needs at DC, recommending tub bench rather than shower chair to allow increased indep and reduce falls risk. Also educated on installing grab bars within shower to add stability. Pt voiced understanding of all and in agreement. Pt returned upstairs to her room in w/c, completed stand<>pivot transfer with supervision to recliner. Remained seated in recliner with chair alarm on, all needs met.   2nd session: Pt seated in recliner to start session - agreeable without reports of pain. Ambulatory transfer with supervision and RW to w/c. Pt with her RW delivered to room. Adjusted to fit and provided RW bag to assist with functional  modified independence. Transported downstairs in w/c for time and energy conservation. Instructed in functional gait training within hallways where she ambulated ~150ft62fh supervision and RW. Gait speed 0.67 m/s, indicative of limited community ambulator and increased risk of falls. Instructed in stair training where she navigated up/down x8, 6 inch steps with 1 hand rail on R via lateral stepping sideways up/down. CGA provided for safety and VC to avoid overcrowding feet while navigating steps. Simulated home entrance and education provided on home safety and transitioning home. Pt reporting feeling confident with upcoming DC. Instructed in gait training with no AD to challenge balance - requires min/modA for balance with gait deficits showing R hip trendelenburg, inconsistent R step length, RLE ataxia, and intermittent toe catch on R.  Instructed in dynamic standing balance task with tossing bean bags to corn hole board, able to complete at supervision level while using her affected R arm to toss. She had x1 LOB anteriorly during through that was self corrected. Pt transported back upstairs to her room on 5C, assisted in ambulatory transfer with supervision and RW to recliner. All needs met at conclusion of session.   Therapy Documentation Precautions:  Precautions Precautions: None Precaution Comments: R weakness, incoordination Restrictions Weight Bearing Restrictions: No General:    Therapy/Group: Individual Therapy  [name]5/2022, 7:31 AM

## 2021-04-16 NOTE — Progress Notes (Signed)
Inpatient Rehabilitation Discharge Medication Review by a Pharmacist   A complete drug regimen review was completed for this patient to identify any potential clinically significant medication issues.   High Risk Drug Classes Is patient taking? Indication by Medication  Antipsychotic No    Anticoagulant No    Antibiotic No    Opioid No    Antiplatelet Yes Plavix, ASA for TIA   Hypoglycemics/insulin No    Vasoactive Medication Yes Norvasc for BP  Chemotherapy No    Other No          Type of Medication Issue Identified Description of Issue Recommendation(s)  Drug Interaction(s) (clinically significant)        Duplicate Therapy        Allergy        No Medication Administration End Date        Incorrect Dose        Additional Drug Therapy Needed        Significant med changes from prior encounter (inform family/care partners about these prior to discharge).      Other            Clinically significant medication issues were identified that warrant physician communication and completion of prescribed/recommended actions by midnight of the next day:  No   Pharmacist comments: None   Time spent performing this drug regimen review (minutes):  20 minutes  Shireen Rayburn BS, PharmD, BCPS Clinical Pharmacist

## 2021-04-16 NOTE — Progress Notes (Shared)
Occupational Therapy Discharge Summary  Patient Details  Name: Christine Fritz MRN: [id number] Date of Birth: 09-Jul-1960  {CHL IP REHAB OT TIME CALCULATIONS:304400400}   Patient has met {NUMBERS 0-12:18577} of {NUMBERS 0-12:18577} long term goals due to {due FB:5102585}.  Patient to discharge at overall {LOA:3049010} level.  Patient's care partner {care partner:3041650} to provide the necessary {assistance:3041652} assistance at discharge.    Reasons goals not met: ***  Recommendation:  Patient will benefit from ongoing skilled OT services in {setting:3041680} to continue to advance functional skills in the area of {ADL/iADL:3041649}.  Equipment: {equipment:3041657}  Reasons for discharge: {Reason for discharge:3049018}  Patient/family agrees with progress made and goals achieved: {Pt/Family agree with progress/goals:3049020}  OT Discharge Precautions/Restrictions  Precautions Precautions: Fall Precaution Comments: R weakness, incoordination Restrictions Weight Bearing Restrictions: No General   Vital Signs  Pain Pain Assessment Pain Scale: 0-10 Pain Score: 0-No pain Faces Pain Scale: No hurt ADL ADL Eating: Supervision/safety Where Assessed-Eating: Chair Grooming: Minimal assistance Where Assessed-Grooming: Standing at sink Upper Body Bathing: Supervision/safety Where Assessed-Upper Body Bathing: Shower, Chair Lower Body Bathing: Minimal assistance Where Assessed-Lower Body Bathing: Shower, Chair Upper Body Dressing: Minimal assistance Where Assessed-Upper Body Dressing: Edge of bed Lower Body Dressing: Moderate assistance Where Assessed-Lower Body Dressing: Edge of bed Toileting: Moderate assistance Where Assessed-Toileting: Bedside Commode Toilet Transfer: Moderate assistance Toilet Transfer Method: Counselling psychologist: Radiographer, therapeutic: Not assessed Social research officer, government: Moderate assistance Social research officer, government  Method: Heritage manager: Grab bars, Gaffer Baseline Vision/History: 1 Wears glasses Patient Visual Report: No change from baseline Vision Assessment?: No apparent visual deficits Ocular Range of Motion: Within Functional Limits Alignment/Gaze Preference: Within Defined Limits Tracking/Visual Pursuits: Able to track stimulus in all quads without difficulty Convergence: Within functional limits Visual Fields: No apparent deficits Perception  Perception: Within Functional Limits Praxis Praxis: Intact Cognition Overall Cognitive Status: Within Functional Limits for tasks assessed Arousal/Alertness: Awake/alert Orientation Level: Oriented X4 Year: 2022 Month: November Day of Week: Correct Attention: Sustained Sustained Attention: Appears intact Memory: Appears intact Immediate Memory Recall: Sock;Blue;Bed Memory Recall Sock: Without Cue Memory Recall Blue: Without Cue Memory Recall Bed: Without Cue Awareness: Appears intact Problem Solving: Appears intact Safety/Judgment: Appears intact Sensation Sensation Light Touch: Appears Intact Hot/Cold: Appears Intact Proprioception: Impaired by gross assessment Stereognosis: Appears Intact Additional Comments: Mild RLE proprioceptive impairment in functional mobility Coordination Gross Motor Movements are Fluid and Coordinated: Yes Fine Motor Movements are Fluid and Coordinated: No Coordination and Movement Description: Decreasee FM and gross motor coordination in the RUE although greatly improved from initial evaluation. Motor  Motor Motor: Hemiplegia Motor - Skilled Clinical Observations: RUE and RLE hemiparesis Motor - Discharge Observations: R hemibody weakness, improved distal to proximal since date of evaluation Mobility  Bed Mobility Bed Mobility: Rolling Right;Rolling Left;Supine to Sit;Sit to Supine Rolling Right: Independent Rolling Left: Independent Supine to Sit: Independent  with assistive device Sit to Supine: Independent with assistive device Transfers Sit to Stand: Independent with assistive device Stand to Sit: Independent with assistive device  Trunk/Postural Assessment     Balance   Extremity/Trunk Assessment       Emma Schupp R Howerton-Davis 04/16/2021, 4:40 PM

## 2021-04-16 NOTE — Progress Notes (Signed)
Occupational Therapy Session Note  Patient Details  Name: Christine Fritz MRN: [id number] Date of Birth: [birth year]  Today's Date: 04/16/2021 OT Individual Time: 1545-1640 OT Individual Time Calculation (min): 55 min    Short Term Goals: Week 1:  OT Short Term Goal 1 (Week 1): Pt will complete UB dressing with supervision. OT Short Term Goal 2 (Week 1): Pt will complete LB dressing with min assist. OT Short Term Goal 3 (Week 1): Pt will complete stand pivot transfer to the 3:1 with use of the RW and min guard assist. OT Short Term Goal 4 (Week 1): Pt will use the RUE at a diminshed level during selfcare tasks with supervision.  Skilled Therapeutic Interventions/Progress Updates:  Patient met seated in recliner in agreement with OT treatment session. 0/10 pain reported at rest and with activity. OT treatment session with focus on ADL transfers, education on DME and light family education in prep for safe d/c. Total A for wc transport to ADL apartment for energy conservation. Patient able to return demo tub/shower transfer to tub transfer bench with use of RW. Patient with concern that tub may be too high for TTB to fit over at home. Education given on measuring tub prior to purchasing. Patient reports plan to convert tub to walk-in shower but does not have a specific timeline. Education also provided on placement of grab bars, the difference between suction cup vs other types of grab bars and tip for managing shower curtain. Patient also educated on safety with supine to EOB transfers. Patient expressed verbal understanding. Functional mobility from ADL apartment to hospital room on [facility] with use of RW and close supervision A. Session concluded with patient seated in recliner with call bell within reach, belt alarm activated and all needs met.   Therapy Documentation Precautions:  Precautions Precautions: Fall Precaution Comments: R weakness, incoordination Restrictions Weight Bearing  Restrictions: No General:    Therapy/Group: Individual Therapy  Leetta Hendriks R Howerton-Davis 04/16/2021, 3:10 PM

## 2021-04-16 NOTE — Progress Notes (Signed)
Inpatient Rehabilitation Care Coordinator Discharge Note DC SAT 11/26  Patient Details  Name: Christine Fritz MRN: [id number] Date of Birth: Oct 31, 1960   Discharge location: HOME WITH HUSBAND AND MOM TO ASSIST WITH SUEPERVISION  Length of Stay: 9 DAYS  Discharge activity level: SUPERVISION LEVEL  Home/community participation: ACTIVE  Patient response OA:CZYSAY Literacy - How often do you need to have someone help you when you read instructions, pamphlets, or other written material from your doctor or pharmacy?: Never  Patient response TK:ZSWFUX Isolation - How often do you feel lonely or isolated from those around you?: Rarely  Services provided included: MD, RD, PT, OT, RN, SLP, CM, Pharmacy, SW  Financial Services:  Financial Services Utilized: [employer]  Choices offered to/list presented to: PT  Follow-up services arranged:  Outpatient, DME, Patient/Family has no preference for HH/DME agencies    Outpatient Servicies: [facility]-PT & OT WILL CALL PT TO SET UP APPOINTMENTS DME : [facility]-3 IN 1 ADDED ROLLING WALKER    Patient response to transportation need: Is the patient able to respond to transportation needs?: Yes In the past 12 months, has lack of transportation kept you from medical appointments or from getting medications?: No In the past 12 months, has lack of transportation kept you from meetings, work, or from getting things needed for daily living?: No    Comments (or additional information):MOM WAS IN FOR EDUCATION PT REACHED SUPERVISION LEVEL GOALS AND FEELS READY FOR DC WILL HAVE 24/7 SUPERVISION AT FIRST. PLANS TO GO TO PCP AND FOLLOW RECOMMENDATIONS  Patient/Family verbalized understanding of follow-up arrangements:  Yes  Individual responsible for coordination of the follow-up plan: PAUL-HUSBAND  [phone]  Confirmed correct DME delivered: Lucy Chris 04/16/2021    Persephonie Hegwood, Lemar Livings

## 2021-04-16 NOTE — Progress Notes (Signed)
[facility] PHYSICAL MEDICINE & REHABILITATION PROGRESS NOTE  Subjective/Complaints:  No c/os , looking fwd to d/c in am   ROS: Denies CP, SOB, N/V/D  Objective: Vital Signs: Blood pressure (!) 147/89, pulse 83, temperature 98.2 F (36.8 C), temperature source Oral, resp. rate 15, SpO2 100 %. No results found. No results for input(s): WBC, HGB, HCT, PLT in the last 72 hours.  No results for input(s): NA, K, CL, CO2, GLUCOSE, BUN, CREATININE, CALCIUM in the last 72 hours.    Intake/Output Summary (Last 24 hours) at 04/16/2021 0809 Last data filed at 04/15/2021 1700 Gross per 24 hour  Intake 826 ml  Output --  Net 826 ml         Physical Exam: BP (!) 147/89 (BP Location: Right Arm)   Pulse 83   Temp 98.2 F (36.8 C) (Oral)   Resp 15   SpO2 100%    General: No acute distress Mood and affect are appropriate Heart: Regular rate and rhythm no rubs murmurs or extra sounds Lungs: Clear to auscultation, breathing unlabored, no rales or wheezes Abdomen: Positive bowel sounds, soft nontender to palpation, nondistended Extremities: No clubbing, cyanosis, or edema Skin: No evidence of breakdown, no evidence of rash  Neuro: Alert. RUE 4+/5. RLE 4+/5, stable. LUE and LLE 5/5. Right sided ataxia UE>LE  Assessment/Plan: 1. Functional deficits which require 3+ hours per day of interdisciplinary therapy in a comprehensive inpatient rehab setting. Physiatrist is providing close team supervision and 24 hour management of active medical problems listed below. Physiatrist and rehab team continue to assess barriers to discharge/monitor patient progress toward functional and medical goals   Care Tool:  Bathing    Body parts bathed by patient: Right arm, Left arm, Chest, Abdomen, Front perineal area, Buttocks, Right upper leg, Left upper leg, Right lower leg, Left lower leg, Face         Bathing assist Assist Level: Minimal Assistance - Patient > 75%     Upper Body  Dressing/Undressing Upper body dressing   What is the patient wearing?: Pull over shirt, Bra    Upper body assist Assist Level: Minimal Assistance - Patient > 75%    Lower Body Dressing/Undressing Lower body dressing      What is the patient wearing?: Pants     Lower body assist Assist for lower body dressing: Supervision/Verbal cueing     Toileting Toileting    Toileting assist Assist for toileting: Contact Guard/Touching assist     Transfers Chair/bed transfer  Transfers assist     Chair/bed transfer assist level: Supervision/Verbal cueing     Locomotion Ambulation   Ambulation assist      Assist level: Supervision/Verbal cueing Assistive device: Walker-rolling Max distance: 259ft   Walk 10 feet activity   Assist     Assist level: Supervision/Verbal cueing Assistive device: Walker-rolling   Walk 50 feet activity   Assist    Assist level: Supervision/Verbal cueing Assistive device: Walker-rolling    Walk 150 feet activity   Assist Walk 150 feet activity did not occur: Safety/medical concerns (fatigue)  Assist level: Supervision/Verbal cueing Assistive device: Walker-rolling    Walk 10 feet on uneven surface  activity   Assist Walk 10 feet on uneven surfaces activity did not occur: Safety/medical concerns   Assist level: Contact Guard/Touching assist Assistive device: Walker-rolling   Wheelchair     Assist Is the patient using a wheelchair?: No Type of Wheelchair: Manual Wheelchair activity did not occur: N/A  Wheelchair assist level: Minimal  Assistance - Patient > 75% Max wheelchair distance: 50    Wheelchair 50 feet with 2 turns activity    Assist    Wheelchair 50 feet with 2 turns activity did not occur: N/A   Assist Level: Minimal Assistance - Patient > 75%   Wheelchair 150 feet activity     Assist  Wheelchair 150 feet activity did not occur: N/A   Assist Level: Dependent - Patient 0%    Medical  Problem List and Plan: 1.  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  Impaired mobility: ambulating >225 feet, may d/c Lovenox             -antiplatelet therapy: Aspirin 81 mg daily and Plavix 75 mg day x3 weeks and aspirin alone 3. Pain Management: Tylenol as needed 4. Mood: Provide emotional support             -antipsychotic agents: N/A 5. Neuropsych: This patient is capable of making decisions on her own behalf. 6. Skin/Wound Care: Routine skin checks 7. Fluids/Electrolytes/Nutrition: Routine in and outs 8.  Hypertension.  Increase Norvasc to 10mg .  Vitals:   04/15/21 1929 04/16/21 0300  BP: (!) 144/84 (!) 147/89  Pulse: 82 83  Resp: 16 15  Temp: 98.3 F (36.8 C) 98.2 F (36.8 C)  SpO2: 100% 100%  Controlled 11/25 9.  New findings hypothyroidism.  Continue Synthroid.  Follow-up thyroid panel 3 to 4 weeks 10.  Admission elevated creatinine 1.59.  Suspect CKD stage III.    Creatinine 1.71 on 11/18,fairly stable at 1.59 on 11/21             -encourage adquate fluids 11.  Moderate pericardial effusion identified on echocardiogram.  Follow-up echocardiogram 12.  Morbid obesity.  BMI 42.40.  Dietary follow-up 13. Slow transit constipation:             -add scheduled miralax and senna-s             -sorbitol prn   Improving 14. Normal K+ cont to monitor  15.  ? RLS sx, received requip  1 mg qhs  LOS: 8 days A FACE TO FACE EVALUATION WAS PERFORMED  [name] 04/16/2021, 8:09 AM

## 2021-04-16 NOTE — Progress Notes (Signed)
Physical Therapy Session Note  Patient Details  Name: Christine Fritz MRN: [id number] Date of Birth: [birth year]  Today's Date: 04/16/2021 PT Individual Time: 1959-7471 PT Individual Time Calculation (min): 45 min   Short Term Goals: Week 1:  PT Short Term Goal 1 (Week 1): pt will complete bed mobility with CGA PT Short Term Goal 2 (Week 1): Pt will complete bed<>chair transfers with CGA and LRAD PT Short Term Goal 3 (Week 1): Pt will ambulate 157f with CGA and LRAD PT Short Term Goal 4 (Week 1): Pt will navigate up/down x4 steps with minA and 2 hand rails Week 2:     Skilled Therapeutic Interventions/Progress Updates:   Pt received sitting in WC and agreeable to PT. [facility] Gait training with RW and supervision assist for safety in turns x 159f Pt transported to day room. Dynamic balance training to perform wii bowling while standing on airex pad and then wii baseball standing on blue wedge. Pt able to maintain standing with supervision assist for safety and min cues for anterior weight shift initially on wedge. Maintained 1 UE support on RW throughout balance training. Patient returned to room and performed stand pivot to recliner with no Ad and supervision assist. Pt left sitting in recliner with call bell in reach and all needs met.         Therapy Documentation Precautions:  Precautions Precautions: Fall Precaution Comments: R weakness, incoordination Restrictions Weight Bearing Restrictions: No   Pain: Pain Assessment Pain Scale: 0-10 Pain Score: 0-No pain   Therapy/Group: Individual Therapy  AuLorie Phenix1/25/2022, 1:49 PM

## 2021-04-17 NOTE — Progress Notes (Signed)
Patient is A&O x 4 and able to make her needs known. Patient husband and daughter at bedside. Patient being D/C this AM. Patient denies pain or discomfort. Patient and family have no questions or concerns about being D/C this AM. Writer educated patient and family of S/S of having  a stroke, the importance of following up with a PCP on a regular basis, and taking medications daily. Call light within reach, safety maintained.

## 2021-04-20 ENCOUNTER — Telehealth: Payer: Self-pay | Admitting: *Deleted

## 2021-04-20 NOTE — Telephone Encounter (Signed)
Mrs Manganello called and said she just got out or inpt rehab this weekend and was told by Jesusita Oka that she could request to be excluded from jury duty (summons for 05/03/21). She reports her juror number is [phone]. I have asked her to bring the summons by our office and I will have the letter for her, and make a copy of the summons for the record.

## 2021-04-20 NOTE — Progress Notes (Signed)
Occupational Therapy Discharge Summary  Patient Details  Name: Christine Fritz MRN: [id number] Date of Birth: April 04, 1961  Today's Date: 04/20/2021      Patient has met 15 of 15 long term goals due to improved activity tolerance, improved balance, postural control, ability to compensate for deficits, functional use of  RIGHT upper extremity, and improved coordination.  Patient to discharge at overall Supervision level.  Patient's care partner is independent to provide the necessary cognitive assistance at discharge.    Reasons goals not met: all goals met  Recommendation:  Patient will benefit from ongoing skilled OT services in outpatient setting to continue to advance functional skills in the area of BADL and iADL and neuro reed of RUE.  Equipment: RW  Reasons for discharge: treatment goals met and discharge from hospital  Patient/family agrees with progress made and goals achieved: Yes  OT Discharge Precautions/Restrictions  Precautions Precautions: Fall Precaution Comments: R weakness, incoordination Restrictions Weight Bearing Restrictions: No  Pain  No pain on 1-10 reporting scale ADL ADL Eating: Independent Where Assessed-Eating: Wheelchair Grooming: Independent Where Assessed-Grooming: Sitting at sink Upper Body Bathing: Setup Where Assessed-Upper Body Bathing: Shower Lower Body Bathing: Setup Where Assessed-Lower Body Bathing: Shower, Chair Upper Body Dressing: Independent Where Assessed-Upper Body Dressing: Edge of bed Lower Body Dressing: Modified independent Where Assessed-Lower Body Dressing: Edge of bed Toileting: Supervision/safety Where Assessed-Toileting: Bedside Commode Toilet Transfer: Close supervision Toilet Transfer Method: Counselling psychologist: Engineer, technical sales Transfer: Close supervison Clinical cytogeneticist Method: Optometrist: Radio broadcast assistant, Energy manager: Moderate  assistance Social research officer, government Method: Heritage manager: Grab bars, Producer, television/film/video Range of Motion: Within Functional Limits Alignment/Gaze Preference: Within Defined Limits Tracking/Visual Pursuits: Able to track stimulus in all quads without difficulty Convergence: Within functional limits Perception  Perception: Within Functional Limits Praxis Praxis: Intact Cognition Overall Cognitive Status: Within Functional Limits for tasks assessed Arousal/Alertness: Awake/alert Year: 2022 Month: November Day of Week: Correct Attention: Sustained Sustained Attention: Appears intact Memory: Appears intact Immediate Memory Recall: Sock;Blue;Bed Memory Recall Sock: Without Cue Memory Recall Blue: Without Cue Memory Recall Bed: Without Cue Awareness: Appears intact Problem Solving: Appears intact Safety/Judgment: Appears intact Sensation Sensation Light Touch: Appears Intact Hot/Cold: Appears Intact Proprioception: Impaired by gross assessment Stereognosis: Appears Intact Additional Comments: Mild RLE proprioceptive impairment in functional mobility Coordination Gross Motor Movements are Fluid and Coordinated: Yes Fine Motor Movements are Fluid and Coordinated: No Coordination and Movement Description: Decreasee FM and gross motor coordination in the RUE although greatly improved from initial evaluation. Motor  Motor Motor: Hemiplegia Motor - Skilled Clinical Observations: RUE and RLE hemiparesis Motor - Discharge Observations: R hemibody weakness, improved distal to proximal since date of evaluation Mobility  Bed Mobility Bed Mobility: Rolling Right;Rolling Left;Supine to Sit;Sit to Supine Rolling Right: Independent Rolling Left: Independent Supine to Sit: Independent with assistive device Sit to Supine: Independent with assistive device Transfers Sit to Stand: Independent with assistive device Stand to Sit: Independent with assistive  device  Trunk/Postural Assessment  Cervical Assessment Cervical Assessment: Exceptions to [facility] (forward head) Thoracic Assessment Thoracic Assessment: Exceptions to [facility] (rounded shoulders) Lumbar Assessment Lumbar Assessment: Exceptions to [facility] (mild posterior pelvic tilt) Postural Control Postural Control: Within Functional Limits  Balance Balance Balance Assessed: Yes Static Sitting Balance Static Sitting - Balance Support: Feet supported Static Sitting - Level of Assistance: 7: Independent Dynamic Sitting Balance Dynamic Sitting - Balance Support: During functional activity Dynamic Sitting - Level of Assistance:  7: Independent Static Standing Balance Static Standing - Balance Support: During functional activity Static Standing - Level of Assistance: 6: Modified independent (Device/Increase time) Dynamic Standing Balance Dynamic Standing - Balance Support: During functional activity;Left upper extremity supported;Right upper extremity supported Dynamic Standing - Level of Assistance: 5: Stand by assistance Extremity/Trunk Assessment RUE Assessment General Strength Comments: 4/5 throughout.  Decreased FM and gross motor coordination for functional use overall. Improved since eval, able to tie shoes, and use as dominant level however increased time needed for precision and control. LUE Assessment LUE Assessment: Within Functional Limits   Christine Fritz Christine Fritz 04/20/2021, 9:05 AM

## 2021-04-28 ENCOUNTER — Other Ambulatory Visit: Payer: Self-pay

## 2021-04-28 ENCOUNTER — Encounter: Payer: Self-pay | Admitting: Occupational Therapy

## 2021-04-28 ENCOUNTER — Ambulatory Visit
Payer: Federal, State, Local not specified - PPO | Attending: Physical Medicine & Rehabilitation | Admitting: Occupational Therapy

## 2021-04-28 DIAGNOSIS — R2689 Other abnormalities of gait and mobility: Secondary | ICD-10-CM | POA: Insufficient documentation

## 2021-04-28 DIAGNOSIS — R2681 Unsteadiness on feet: Secondary | ICD-10-CM | POA: Diagnosis not present

## 2021-04-28 DIAGNOSIS — I69851 Hemiplegia and hemiparesis following other cerebrovascular disease affecting right dominant side: Secondary | ICD-10-CM | POA: Diagnosis not present

## 2021-04-28 DIAGNOSIS — R278 Other lack of coordination: Secondary | ICD-10-CM | POA: Insufficient documentation

## 2021-04-28 DIAGNOSIS — M6281 Muscle weakness (generalized): Secondary | ICD-10-CM | POA: Diagnosis not present

## 2021-04-28 NOTE — Therapy (Signed)
[facility] [address] [place], [place], [address] Phone: [phone]   Fax:  [phone]  Occupational Therapy Evaluation  Patient Details  Name: Christine Fritz MRN: [id number] Date of Birth: [birth year] Referring Provider (OT): Alger Simons, MD   Encounter Date: 04/28/2021   OT End of Session - 04/28/21 1154     Visit Number 1    Number of Visits 6    Date for OT Re-Evaluation 06/23/21   6 visits over 8 weeks d/t any scheduling conflicts   Authorization Type [employer]    Authorization Time Period $30 copay VL:50 combined No Auth    Authorization - Number of Visits 25    OT Start Time 0845    OT Stop Time 0930    OT Time Calculation (min) 45 min    Activity Tolerance Patient tolerated treatment well    Behavior During Therapy [facility] for tasks assessed/performed             History reviewed. No pertinent past medical history.  History reviewed. No pertinent surgical history.  There were no vitals filed for this visit.   Subjective Assessment - 04/28/21 1052     Subjective  Pt is a 60 year old female that presents to Neuro OPOT s/p CVA on 04/05/21 with residual RUE weakness and decreased coordination. Pt reports the RUE come and goes in weakness and decreased coordination. Pt enjoys baking and crafting and reports wanting to get back to these activities. Pt lives with spouse in 2 story home and bed/bath on main level. Pt denies any pain and denies any change in vision.    Pertinent History PMH: none    Limitations Fall Risk    Patient Stated Goals "get back to baking and crafting"    Currently in Pain? No/denies    Pain Score 0-No pain               [facility] OT Assessment - 04/28/21 0853       Assessment   Medical Diagnosis CVA    Referring Provider (OT) Alger Simons, MD    Onset Date/Surgical Date 04/05/21    Hand Dominance Right    Prior Therapy inpatient rehab      Precautions   Precautions Fall       Balance Screen   Has the patient fallen in the past 6 months Yes    How many times? 2   defer to PT evaluation     Home  Environment   Family/patient expects to be discharged to: Private residence    Living Arrangements Spouse/significant other    Available Help at Discharge Family    Type of [place]   [place] to live on main level with bedroom/bathroom    Bathroom Shower/Tub Tub/Shower unit    [place] - 2 wheels;Bedside commode;Grab bars - tub/shower      Prior Function   Level of Independence Independent    Leisure read, crochet, cake decorating      ADL   Eating/Feeding Modified independent   reports cutting up steak   Grooming Minimal assistance   unable to straighten hair today but otherwise doing all grooming   Upper Body Bathing Modified independent    Lower Body Bathing Modified independent    Upper Body Dressing Independent    Lower Body Dressing Modified independent    Toilet Transfer Modified independent    Toileting -  Clothing Manipulation Modified independent    Toileting -  Hygiene Modified Independent    Tub/Shower Transfer Moderate assistance      IADL   Shopping Completely unable to shop    Light Housekeeping [place] small items, rinses stockings, etc.;Performs light daily tasks such as dishwashing, bed making    Meal Prep Able to complete simple warm meal prep;Able to complete simple cold meal and snack prep    Community Mobility Relies on family or friends for transportation    Medication Management Is responsible for taking medication in correct dosages at correct time    Development worker, community financial matters independently (budgets, writes checks, pays rent, bills goes to bank), collects and keeps track of income      Mobility   Mobility Status Comments ambulates with 2 wheel RW to eval      Written Expression   Dominant Hand Right      Vision - History   Baseline Vision Wears glasses  for distance only    Additional Comments pt denies any changes      Cognition   Overall Cognitive Status Within Functional Limits for tasks assessed      Observation/Other Assessments   Focus on Therapeutic Outcomes (FOTO)  was not caputred by front office at eval      Sensation   Light Touch Appears Intact    Hot/Cold Appears Intact      Coordination   9 Hole Peg Test Right;Left    Right 9 Hole Peg Test 31.13s    Left 9 Hole Peg Test 24.06s    Box and Blocks R 48, L 46      ROM / Strength   AROM / PROM / Strength AROM;Strength      AROM   Overall AROM  Within functional limits for tasks performed      Strength   Overall Strength Within functional limits for tasks performed      Hand Function   Right Hand Gross Grasp Functional    Right Hand Grip (lbs) 65    Left Hand Gross Grasp Functional    Left Hand Grip (lbs) 68.1                                OT Short Term Goals - 04/28/21 1152       OT SHORT TERM GOAL #1   Title Pt will be independent with HEP for coordination    Time 4    Period Weeks    Status New    Target Date 05/26/21      OT SHORT TERM GOAL #2   Title Pt will bake cupcakes with mod I and good safety awareness    Time 4    Period Weeks    Status New      OT SHORT TERM GOAL #3   Title Pt will verbalize understanding of adapated strategies and/or equipment for increasing independence and ease with completeing ADLs and IADLs (baking, crafts, etc)    Time 4    Period Weeks    Status New               OT Long Term Goals - 04/28/21 1158       OT LONG TERM GOAL #1   Title Pt will be independent with grip strength HEP.    Time 8    Period Weeks    Status New    Target Date  06/23/21      OT LONG TERM GOAL #2   Title Pt will increase grip strength by 5 lbs or greater for increasing strength in dominant hand.    Baseline R 65, L 68.1    Time 8    Period Weeks    Status New      OT LONG TERM GOAL #3   Title Pt  will improve legibility and fluidity of handwriting with RUE to 100% of prior level    Baseline 75%    Time 8    Period Weeks    Status New      OT LONG TERM GOAL #4   Title Pt will increase coordination in RUE evidenced by completing 9 hole peg test in 25 seconds or less.    Baseline R 31.13s, L 24.06s    Time 8    Period Weeks    Status New      OT LONG TERM GOAL #5   Title Pt will report baking and decorating cupcakes and/or cake and/or completing crafting task (floral arrangements) with mod I    Time 8    Period Weeks    Status New                   Plan - 04/28/21 0917     Clinical Impression Statement Pt is a 60 year old female that presents to Neuro OPOT s/p CVA on 04/05/21 . Pt presents today with residual weakness and decreased coodination and unsteadiness on feet. Pt would benefit from skilled occupational therapy to address areas listed as deficit and increase independence and safety with ADLs and IADLs.    OT Occupational Profile and History Problem Focused Assessment - Including review of records relating to presenting problem    Occupational performance deficits (Please refer to evaluation for details): IADL's;ADL's;Leisure    Body Structure / Function / Physical Skills ADL;FMC;Decreased knowledge of use of DME;Coordination;IADL;ROM;UE functional use;GMC;Strength;Dexterity    Rehab Potential Good    Clinical Decision Making Limited treatment options, no task modification necessary    Comorbidities Affecting Occupational Performance: None    Modification or Assistance to Complete Evaluation  No modification of tasks or assist necessary to complete eval    OT Frequency 1x / week    OT Duration 8 weeks   6 visits over 8 weeks d/t any scheduling conflicts   OT Treatment/Interventions Self-care/ADL training;DME and/or AE instruction;Functional Mobility Training;Manual Therapy;Patient/family education;Passive range of motion;Neuromuscular education;Therapeutic  exercise;Therapeutic activities    Plan coordination HEP for RUE, handwriting, simple cooking    Consulted and Agree with Plan of Care Patient             Patient will benefit from skilled therapeutic intervention in order to improve the following deficits and impairments:   Body Structure / Function / Physical Skills: ADL, FMC, Decreased knowledge of use of DME, Coordination, IADL, ROM, UE functional use, GMC, Strength, Dexterity       Visit Diagnosis: 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

## 2021-04-29 ENCOUNTER — Ambulatory Visit: Payer: Federal, State, Local not specified - PPO | Admitting: Physical Therapy

## 2021-04-29 ENCOUNTER — Encounter: Payer: Self-pay | Admitting: Physical Therapy

## 2021-04-29 VITALS — BP 148/98 | HR 89

## 2021-04-29 DIAGNOSIS — I69851 Hemiplegia and hemiparesis following other cerebrovascular disease affecting right dominant side: Secondary | ICD-10-CM | POA: Diagnosis not present

## 2021-04-29 DIAGNOSIS — R278 Other lack of coordination: Secondary | ICD-10-CM

## 2021-04-29 DIAGNOSIS — R2681 Unsteadiness on feet: Secondary | ICD-10-CM | POA: Diagnosis not present

## 2021-04-29 DIAGNOSIS — M6281 Muscle weakness (generalized): Secondary | ICD-10-CM

## 2021-04-29 DIAGNOSIS — R2689 Other abnormalities of gait and mobility: Secondary | ICD-10-CM | POA: Diagnosis not present

## 2021-04-29 NOTE — Therapy (Signed)
[facility] [address] [place], [place], [address] Phone: [phone]   Fax:  [phone]  Physical Therapy Evaluation  Patient Details  Name: Christine Fritz MRN: [id number] Date of Birth: April 19, 1961 Referring Provider (PT): Meredith Staggers, MD   Encounter Date: 04/29/2021   PT End of Session - 04/29/21 0921     Visit Number 1    Number of Visits 17   do not anticipate pt needing all visits   Date for PT Re-Evaluation 07/28/21    Authorization Type [employer] - $30 copay, VL combined with pt, ot, st: 64    Authorization - Number of Visits 60    PT Start Time [phone]    PT Stop Time 0916    PT Time Calculation (min) 38 min    Equipment Utilized During Treatment Gait belt    Activity Tolerance Patient tolerated treatment well    Behavior During Therapy [facility] for tasks assessed/performed             History reviewed. No pertinent past medical history.  History reviewed. No pertinent surgical history.  Vitals:   04/29/21 0841 04/29/21 0904  BP: (!) 146/96 (!) 148/98  Pulse: 85 89      Subjective Assessment - 04/29/21 0843     Subjective Presented 04/04/2021 with with transient right side weakness.  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  Remote lacunar infarct of the right basal ganglia/corona radiata. Had inpatient PT and OT. Was discharged home on 04/16/21. Comes in with her RW today. Does some steps without the RW, but always has it close by. No falls since she has been home. Feels like RLE wants to buckle sometimes.    Pertinent History PMH: CVA, HTN, stage 3 CKD    Limitations Walking;House hold activities    Diagnostic tests CT/MRI showed a 1 cm acute ischemic nonhemorrhagic infarct involving the posterior limb of the left internal capsule.  Remote lacunar infarct of the right basal ganglia/corona radiata.    Patient Stated  Goals wants to get back to normal - walking with no device, baking/decorating cakes.    Currently in Pain? No/denies                [facility] PT Assessment - 04/29/21 0847       Assessment   Medical Diagnosis CVA    Referring Provider (PT) Meredith Staggers, MD    Onset Date/Surgical Date 04/05/21    Hand Dominance Right    Prior Therapy inpatient rehab      Precautions   Precautions Fall    Precaution Comments no driving.      Balance Screen   Has the patient fallen in the past 6 months No    Has the patient had a decrease in activity level because of a fear of falling?  Yes    Is the patient reluctant to leave their home because of a fear of falling?  No      Home Environment   Living Environment Private residence    Living Arrangements Spouse/significant other    Type of [place] to enter    Entrance Stairs-Number of Steps 4    Entrance Stairs-Rails Right;Left;Cannot reach both    [place] Two level;Able to live on main level with bedroom/bathroom    [place] - 2 wheels;Shower seat    Additional  Comments states [employer]BS is going to come out and do a home evaluation - going to look into getting a walk-in shower      Prior Function   Level of Independence Independent    Leisure enjoys baking, crafting, crocheting      Sensation   Light Touch Appears Intact      Coordination   Gross Motor Movements are Fluid and Coordinated No    Heel Shin Test slower to perform with RLE      Posture/Postural Control   Posture/Postural Control Postural limitations    Postural Limitations Rounded Shoulders      ROM / Strength   AROM / PROM / Strength Strength      Strength   Strength Assessment Site Hip;Ankle;Knee    Right/Left Hip Right;Left    Right Hip Flexion 4+/5    Left Hip Flexion 5/5    Right/Left Knee Right;Left    Right Knee Flexion 4+/5    Right Knee Extension 4+/5    Left Knee Flexion 5/5    Left Knee Extension 5/5     Right/Left Ankle Right;Left    Right Ankle Dorsiflexion 4+/5    Left Ankle Dorsiflexion 5/5      Transfers   Transfers Sit to Stand;Stand to Sit    Sit to Stand 5: Supervision    Five time sit to stand comments  17.82 seconds with no UE support from chair    Stand to Sit 5: Supervision      Ambulation/Gait   Ambulation/Gait Yes    Ambulation/Gait Assistance 5: Supervision;4: Min guard    Ambulation/Gait Assistance Details Min guard at times when ambulating with no AD.    Assistive device Rolling walker;None    Gait Pattern Step-through pattern;Decreased stance time - right;Decreased dorsiflexion - right;Decreased hip/knee flexion - right;Decreased arm swing - right    Ambulation Surface Level;Indoor    Gait velocity 11.72 secs with RW = 2.79 ft/sec, 11.43 seconds with no AD = 2.87 ft/sec      Standardized Balance Assessment   Standardized Balance Assessment Timed Up and Go Test;Berg Balance Test      Berg Balance Test   Sit to Stand Able to stand without using hands and stabilize independently    Standing Unsupported Able to stand safely 2 minutes    Sitting with Back Unsupported but Feet Supported on Floor or Stool Able to sit safely and securely 2 minutes    Stand to Sit Sits safely with minimal use of hands    Transfers Able to transfer safely, minor use of hands    Standing Unsupported with Eyes Closed Able to stand 10 seconds safely    Standing Unsupported with Feet Together Able to place feet together independently but unable to hold for 30 seconds      Timed Up and Go Test   Normal TUG (seconds) 12.78   no AD                       Objective measurements completed on examination: See above findings.                PT Education - 04/29/21 0919     Education Details Clinical findings, POC, importance of taking BP medication prior to therapy and BP parameters for therapy, monitoring her BP daily 2x a day at home (pt has not yet checked it since  being home from the hospital) and keeping a log and for pt to make  an appt with her PCP as soon as she can for BP management.    Person(s) Educated Patient    Methods Explanation    Comprehension Verbalized understanding              PT Short Term Goals - 04/29/21 4259       PT SHORT TERM GOAL #1   Title Pt will be independent with HEP in order to build upon functional gains made in therapy. ALL STGS DUE 05/27/21    Time 4    Period Weeks    Status New    Target Date 05/27/21      PT SHORT TERM GOAL #2   Title Finish BERG assessment/perform DGI and write STG/LTG    Baseline did not get to finish at eval    Time 4    Period Weeks    Status New      PT SHORT TERM GOAL #3   Title Pt will decr 5x sit <> stand without UE support to 15.5 seconds or less in order to demo decr fall risk/improved functional BLE strength    Time 4    Period Weeks    Status New      PT SHORT TERM GOAL #4   Title Pt will improve gait speed to at least 3.1 ft/sec with no AD vs. LRAD in order to demo improved gait efficiency.    Baseline 2.87 ft/sec    Time 4    Period Weeks    Status New               PT Long Term Goals - 04/29/21 5638       PT LONG TERM GOAL #1   Title Pt will be independent with final HEP in order to build upon functional gains made in therapy    Time 8    Period Weeks    Status New      PT LONG TERM GOAL #2   Title Pt will improve gait speed to at least 3.4 ft/sec with no AD vs. LRAD in order to demo improved gait efficiency.    Baseline 2.87 ft/sec    Time 8    Period Weeks    Status New      PT LONG TERM GOAL #3   Title BERG/DGI goal to be written as appropriate.    Time 8    Period Weeks    Status New      PT LONG TERM GOAL #4   Title Pt will go up and down 4 steps with single handrail and step through pattern with mod I in order to safely enter/exit home.    Time 8    Period Weeks    Status New      PT LONG TERM GOAL #5   Title Pt will ambulate at  least 500' on unlevel outdoor surfaces with mod I with LRAD vs. no AD in order to demo improved community mobility.    Time 8    Period Weeks    Status New                    Plan - 04/29/21 0926     Clinical Impression Statement Patient is a 60 year old female referred to Neuro OPPT for CVA. Pt hospitalized on 04/04/21. Pt received inpatient rehab and discharged home 04/16/21. Pt ambulates in with RW. Prior to CVA pt was independent and ambulating with no AD.  Pt's PMH is significant  for:HTN, stage 3 CKD. The following deficits were present during the exam: decr strength, impaired balance, postural abnormalities, decr coordination, gait abnormalities. Based on 5x sit <> stand pt is an incr risk for falls. With gait with no AD, pt is a limited community ambulator, but needing min guard at times. Unable to finish any further balance assessments due to pt's diastolic BP being elevated in session (still [facility]). Pt did not take her BP meds this morning, pt asympatomatic and educated to take them immediately once she got home. Pt would benefit from skilled PT to address these impairments and functional limitations to maximize functional mobility independence and decr fall risk.    Personal Factors and Comorbidities Comorbidity 3+;Past/Current Experience    Comorbidities PMH: CVA, HTN, stage 3 CKD    Examination-Activity Limitations Transfers;Stairs;Locomotion Level;Carry;Lift    Examination-Participation Restrictions Community Activity;Driving   baking, crafting   Stability/Clinical Decision Making Evolving/Moderate complexity    Clinical Decision Making Moderate    Rehab Potential Good    PT Frequency 2x / week    PT Duration 12 weeks   do not anticipate pt needing all visits   PT Treatment/Interventions ADLs/Self Care Home Management;Aquatic Therapy;DME Instruction;Gait training;Functional mobility training;Stair training;Therapeutic activities;Therapeutic exercise;Balance  training;Neuromuscular re-education;Patient/family education;Vestibular    PT Next Visit Plan CHECK BP. finish BERG/perform DGI with goal written. initial HEP for RLE strengthening/balance. gait with no AD vs. 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 [place], [place], [address] Phone: [phone]   Fax:  [phone]  Name: Christine Fritz MRN: [id number] Date of Birth: 1961/05/13

## 2021-05-05 ENCOUNTER — Other Ambulatory Visit: Payer: Self-pay

## 2021-05-05 ENCOUNTER — Encounter: Payer: Self-pay | Admitting: Occupational Therapy

## 2021-05-05 ENCOUNTER — Ambulatory Visit: Payer: Federal, State, Local not specified - PPO | Admitting: Occupational Therapy

## 2021-05-05 DIAGNOSIS — R278 Other lack of coordination: Secondary | ICD-10-CM | POA: Diagnosis not present

## 2021-05-05 DIAGNOSIS — M6281 Muscle weakness (generalized): Secondary | ICD-10-CM

## 2021-05-05 DIAGNOSIS — I69851 Hemiplegia and hemiparesis following other cerebrovascular disease affecting right dominant side: Secondary | ICD-10-CM | POA: Diagnosis not present

## 2021-05-05 DIAGNOSIS — R2689 Other abnormalities of gait and mobility: Secondary | ICD-10-CM | POA: Diagnosis not present

## 2021-05-05 DIAGNOSIS — R2681 Unsteadiness on feet: Secondary | ICD-10-CM

## 2021-05-05 NOTE — Patient Instructions (Signed)
Basic Activities:  °  °Use your affected hand to perform the following activities for 20-30 minutes 1-2 times/day.  Stop activity if you experience pain. °  °- Flip playing cards °- Deal top card with thumb °- Toss ball °- Rotate ball in your hand  °- Pick up coins and place in a container °- Stack coins (stacks of 5) and manipulate one at a time to fingertips to place in bank/container °- Fold towels °- Stack blocks °- Pick up 1-inch blocks °  ° °

## 2021-05-05 NOTE — Therapy (Signed)
[facility] [address] [place], [place], [address] Phone: [phone]   Fax:  [phone]  Occupational Therapy Treatment  Patient Details  Name: Christine Fritz MRN: [id number] Date of Birth: [birth year] Referring Provider (OT): Alger Simons, MD   Encounter Date: 05/05/2021   OT End of Session - 05/05/21 0849     Visit Number 2    Number of Visits 6    Date for OT Re-Evaluation 06/23/21   6 visits over 8 weeks d/t any scheduling conflicts   Authorization Type BC[employer]    Authorization Time Period $30 copay VL:50 combined No Auth    Authorization - Number of Visits 25    OT Start Time 0848    OT Stop Time 0930    OT Time Calculation (min) 42 min    Activity Tolerance Patient tolerated treatment well    Behavior During Therapy [facility] for tasks assessed/performed             History reviewed. No pertinent past medical history.  History reviewed. No pertinent surgical history.  There were no vitals filed for this visit.   Subjective Assessment - 05/05/21 0849     Subjective  Pt denies any pain and denies any changes since evaluation.    Pertinent History PMH: none    Limitations Fall Risk    Patient Stated Goals "get back to baking and crafting"    Currently in Pain? No/denies    Pain Score 0-No pain              Small Pegboard with RUE while holding dice for isolating certain fingers and holding digits in palm. Pt completed with min difficulty with coordination and min drops. Copied pattern with 100% accuracy. Removed with in hand manipulation with min difficulty and cues   Coordination HEP   Handwriting practiced with foam grip for increased barrel for increased coordination - pt with good control with writing 2 sentences. Pt reports foam grip did not make a difference. Did not issue.                OT Education - 05/05/21 0856     Education Details Coordination HEP    Person(s)  Educated Patient    Methods Explanation;Demonstration;Handout    Comprehension Verbalized understanding;Returned demonstration              OT Short Term Goals - 04/28/21 1152       OT SHORT TERM GOAL #1   Title Pt will be independent with HEP for coordination    Time 4    Period Weeks    Status New    Target Date 05/26/21      OT SHORT TERM GOAL #2   Title Pt will bake cupcakes with mod I and good safety awareness    Time 4    Period Weeks    Status New      OT SHORT TERM GOAL #3   Title Pt will verbalize understanding of adapated strategies and/or equipment for increasing independence and ease with completeing ADLs and IADLs (baking, crafts, etc)    Time 4    Period Weeks    Status New               OT Long Term Goals - 04/28/21 1158       OT LONG TERM GOAL #1   Title Pt will be independent with grip strength HEP.    Time 8    Period  Weeks    Status New    Target Date 06/23/21      OT LONG TERM GOAL #2   Title Pt will increase grip strength by 5 lbs or greater for increasing strength in dominant hand.    Baseline R 65, L 68.1    Time 8    Period Weeks    Status New      OT LONG TERM GOAL #3   Title Pt will improve legibility and fluidity of handwriting with RUE to 100% of prior level    Baseline 75%    Time 8    Period Weeks    Status New      OT LONG TERM GOAL #4   Title Pt will increase coordination in RUE evidenced by completing 9 hole peg test in 25 seconds or less.    Baseline R 31.13s, L 24.06s    Time 8    Period Weeks    Status New      OT LONG TERM GOAL #5   Title Pt will report baking and decorating cupcakes and/or cake and/or completing crafting task (floral arrangements) with mod I    Time 8    Period Weeks    Status New                   Plan - 05/05/21 3614     Clinical Impression Statement Pt verbalized understanding of goals.    OT Occupational Profile and History Problem Focused Assessment - Including review  of records relating to presenting problem    Occupational performance deficits (Please refer to evaluation for details): IADL's;ADL's;Leisure    Body Structure / Function / Physical Skills ADL;FMC;Decreased knowledge of use of DME;Coordination;IADL;ROM;UE functional use;GMC;Strength;Dexterity    Rehab Potential Good    Clinical Decision Making Limited treatment options, no task modification necessary    Comorbidities Affecting Occupational Performance: None    Modification or Assistance to Complete Evaluation  No modification of tasks or assist necessary to complete eval    OT Frequency 1x / week    OT Duration 8 weeks   6 visits over 8 weeks d/t any scheduling conflicts   OT Treatment/Interventions Self-care/ADL training;DME and/or AE instruction;Functional Mobility Training;Manual Therapy;Patient/family education;Passive range of motion;Neuromuscular education;Therapeutic exercise;Therapeutic activities    Plan handwriting, simple cooking    Consulted and Agree with Plan of Care Patient             Patient will benefit from skilled therapeutic intervention in order to improve the following deficits and impairments:   Body Structure / Function / Physical Skills: ADL, FMC, Decreased knowledge of use of DME, Coordination, IADL, ROM, UE functional use, GMC, Strength, Dexterity       Visit Diagnosis: 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)  Muscle weakness (generalized)  Unsteadiness on feet  Other lack of coordination    Problem List Patient Active Problem List   Diagnosis Date Noted   Slow transit constipation    Stage 3a chronic kidney disease (HCC)    Essential hypertension    Small vessel disease, cerebrovascular 04/08/2021   Acute CVA (cerebrovascular accident) [facility]) 04/05/2021    Junious Dresser, OT 05/05/2021, 9:41 AM  [facility] [address] [place], [place], [address] Phone: [phone]   Fax:  [phone]  Name: Christine Fritz MRN: [id number] Date of Birth: 10/21/1960

## 2021-05-11 ENCOUNTER — Ambulatory Visit: Payer: Federal, State, Local not specified - PPO | Admitting: Physical Therapy

## 2021-05-11 ENCOUNTER — Encounter: Payer: Self-pay | Admitting: Physical Therapy

## 2021-05-11 ENCOUNTER — Other Ambulatory Visit: Payer: Self-pay

## 2021-05-11 VITALS — BP 132/88 | HR 82

## 2021-05-11 DIAGNOSIS — M6281 Muscle weakness (generalized): Secondary | ICD-10-CM

## 2021-05-11 DIAGNOSIS — R2681 Unsteadiness on feet: Secondary | ICD-10-CM

## 2021-05-11 DIAGNOSIS — R278 Other lack of coordination: Secondary | ICD-10-CM | POA: Diagnosis not present

## 2021-05-11 DIAGNOSIS — I69851 Hemiplegia and hemiparesis following other cerebrovascular disease affecting right dominant side: Secondary | ICD-10-CM | POA: Diagnosis not present

## 2021-05-11 DIAGNOSIS — R2689 Other abnormalities of gait and mobility: Secondary | ICD-10-CM | POA: Diagnosis not present

## 2021-05-11 NOTE — Patient Instructions (Signed)
Access Code: [email or web address] URL: [email or web address] Date: 05/11/2021 Prepared by: Sherlie Ban  Exercises Staggered Sit-to-Stand - 2 x daily - 5 x weekly - 1-2 sets - 10 reps Standing Marching - 1-2 x daily - 5 x weekly - 2 sets - 10 reps Standing Single Leg Stance with Counter Support - 2 x daily - 5 x weekly - 3 sets - 10 hold Standing Tandem Balance with Counter Support - 2 x daily - 5 x weekly - 3 sets - 15 hold Romberg Stance with Eyes Closed - 2 x daily - 5 x weekly - 2 sets - 5 reps

## 2021-05-11 NOTE — Therapy (Addendum)
[facility] [address] [place], [place], [address] Phone: [phone]   Fax:  [phone]  Physical Therapy Treatment  Patient Details  Name: Christine Fritz MRN: [id number] Date of Birth: 1960/08/04 Referring Provider (PT): Ranelle Oyster, MD   Encounter Date: 05/11/2021   PT End of Session - 05/11/21 1105     Visit Number 2    Number of Visits 17   do not anticipate pt needing all visits   Date for PT Re-Evaluation 07/28/21    Authorization Type Federal BC[employer] - $30 copay, VL combined with pt, ot, st: 60    Authorization - Number of Visits 60    PT Start Time 1103    PT Stop Time 1144    PT Time Calculation (min) 41 min    Equipment Utilized During Treatment Gait belt    Activity Tolerance Patient tolerated treatment well    Behavior During Therapy [facility] for tasks assessed/performed             History reviewed. No pertinent past medical history.  History reviewed. No pertinent surgical history.  Vitals:   05/11/21 1109  BP: 132/88  Pulse: 82     Subjective Assessment - 05/11/21 1105     Subjective Had a fall on Saturday, did not take the walker all the way into the bathroom and slipped when not using an AD. Having some bruising.    Pertinent History PMH: CVA, HTN, stage 3 CKD    Limitations Walking;House hold activities    Diagnostic tests CT/MRI showed a 1 cm acute ischemic nonhemorrhagic infarct involving the posterior limb of the left internal capsule.  Remote lacunar infarct of the right basal ganglia/corona radiata.    Patient Stated Goals wants to get back to normal - walking with no device, baking/decorating cakes.    Currently in Pain? Yes    Pain Score 1     Pain Location Elbow    Pain Orientation Right    Pain Descriptors / Indicators --   bruised   Pain Type Acute pain    Aggravating Factors  After her fall                [facility] PT Assessment - 05/11/21 1111       Berg Balance  Test   Sit to Stand Able to stand without using hands and stabilize independently    Standing Unsupported Able to stand safely 2 minutes    Sitting with Back Unsupported but Feet Supported on Floor or Stool Able to sit safely and securely 2 minutes    Stand to Sit Sits safely with minimal use of hands    Transfers Able to transfer safely, minor use of hands    Standing Unsupported with Eyes Closed Able to stand 10 seconds safely    Standing Unsupported with Feet Together Able to place feet together independently but unable to hold for 30 seconds    From Standing, Reach Forward with Outstretched Arm Can reach confidently >25 cm (10")    From Standing Position, Pick up Object from Floor Able to pick up shoe safely and easily    From Standing Position, Turn to Look Behind Over each Shoulder Looks behind from both sides and weight shifts well    Turn 360 Degrees Able to turn 360 degrees safely one side only in 4 seconds or less   4.9 to L, 3.68 to R   Standing Unsupported, Alternately Place Feet on Step/Stool Able  to stand independently and complete 8 steps >20 seconds    Standing Unsupported, One Foot in Front Able to plae foot ahead of the other independently and hold 30 seconds    Standing on One Leg Tries to lift leg/unable to hold 3 seconds but remains standing independently   1.15 SECS   Total Score 48    Berg comment: moderate fall risk                           [facility] Adult PT Treatment/Exercise - 05/11/21 1111       Ambulation/Gait   Ambulation/Gait Yes    Ambulation/Gait Assistance 5: Supervision;4: Min guard    Ambulation/Gait Assistance Details Between activities in session    Assistive device None    Gait Pattern Step-through pattern;Decreased stance time - right;Decreased dorsiflexion - right;Decreased hip/knee flexion - right;Decreased arm swing - right    Ambulation Surface Level;Indoor    Gait Comments Provided new tennis balls for RW.      Neuro Re-ed     Neuro Re-ed Details  On level ground: feet together x10 reps head turns, x10 reps head nods. Feet together eyes closed x30 seconds      Exercises   Exercises Other Exercises    Other Exercises  x10 reps bridging, cues for full ROM             Access Code: [email or web address] URL: [email or web address] Date: 05/11/2021 Prepared by: Janann August  Initiated HEP for RLE strengthening and standing balance. See MedBridge for more details.   Exercises Staggered Sit-to-Stand - 2 x daily - 5 x weekly - 1-2 sets - 10 reps - with RLE staggered posteriorly  Standing Marching - 1-2 x daily - 5 x weekly - 2 sets - 10 reps - standing at countertop  Standing Single Leg Stance with Counter Support - 2 x daily - 5 x weekly - 3 sets - 10 hold Standing Tandem Balance with Counter Support - 2 x daily - 5 x weekly - 3 sets - 15 hold - performed with each leg  Romberg Stance with Eyes Closed - 2 x daily - 5 x weekly - 2 sets - 5 reps - 2 x 5 reps head turns, 2 x 5 reps head nods       PT Education - 05/11/21 1107     Education Details Putting night lights in bathroom for safety and to decr fall risk, results of BERG, initial HEP    Person(s) Educated Patient    Methods Explanation;Demonstration;Handout    Comprehension Verbalized understanding;Returned demonstration              PT Short Term Goals - 05/11/21 1330       PT SHORT TERM GOAL #1   Title Pt will be independent with HEP in order to build upon functional gains made in therapy. ALL STGS DUE 05/27/21    Time 4    Period Weeks    Status New    Target Date 05/27/21      PT SHORT TERM GOAL #2   Title Pt will improve BERG score to at least a 52/55 in order to decr fall risk    Baseline 48/56    Time 4    Period Weeks    Status Revised      PT SHORT TERM GOAL #3   Title Pt will decr 5x sit <> stand without UE support to 15.5 seconds or less in  order to demo decr fall risk/improved functional BLE strength    Time 4     Period Weeks    Status New      PT SHORT TERM GOAL #4   Title Pt will improve gait speed to at least 3.1 ft/sec with no AD vs. LRAD in order to demo improved gait efficiency.    Baseline 2.87 ft/sec    Time 4    Period Weeks    Status New      PT SHORT TERM GOAL #5   Title Perform DGI/FGA with LTG written.    Time 4    Period Weeks    Status New               PT Long Term Goals - 05/11/21 1331       PT LONG TERM GOAL #1   Title Pt will be independent with final HEP in order to build upon functional gains made in therapy    Time 8    Period Weeks    Status New      PT LONG TERM GOAL #2   Title Pt will improve gait speed to at least 3.4 ft/sec with no AD vs. LRAD in order to demo improved gait efficiency.    Baseline 2.87 ft/sec    Time 8    Period Weeks    Status New      PT LONG TERM GOAL #3   Title DGI/FGA score to be written.    Time 8    Period Weeks    Status Revised      PT LONG TERM GOAL #4   Title Pt will go up and down 4 steps with single handrail and step through pattern with mod I in order to safely enter/exit home.    Time 8    Period Weeks    Status New      PT LONG TERM GOAL #5   Title Pt will ambulate at least 500' on unlevel outdoor surfaces with mod I with LRAD vs. no AD in order to demo improved community mobility.    Time 8    Period Weeks    Status New                   Plan - 05/11/21 1331     Clinical Impression Statement Performed the BERG today with pt scoring a 48/56, indicating a moderate fall risk. LTG written as appropriate. Worked on gait with no AD during session between activities with pt requiring supervision/min guard.  Initiated HEP for standing balance and strengthening. Pt tolerated session well, reporting RLE fatigued at the end. Will continue to progress towards LTGs.    Personal Factors and Comorbidities Comorbidity 3+;Past/Current Experience    Comorbidities PMH: CVA, HTN, stage 3 CKD     Examination-Activity Limitations Transfers;Stairs;Locomotion Level;Carry;Lift    Examination-Participation Restrictions Community Activity;Driving   baking, crafting   Stability/Clinical Decision Making Evolving/Moderate complexity    Rehab Potential Good    PT Frequency 2x / week    PT Duration 12 weeks   do not anticipate pt needing all visits   PT Treatment/Interventions ADLs/Self Care Home Management;Aquatic Therapy;DME Instruction;Gait training;Functional mobility training;Stair training;Therapeutic activities;Therapeutic exercise;Balance training;Neuromuscular re-education;Patient/family education;Vestibular    PT Next Visit Plan try gait with cane and with no AD. RLE strengthening and balance activities - working on SLS, narrow BOS, unlevel surfaces. perform DGI vs. FGA.    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 [place], [place], [address] Phone: [phone]   Fax:  [phone]  Name: PHOENICIA YBARRA MRN: [id number] Date of Birth: 02-03-1961

## 2021-05-13 ENCOUNTER — Encounter: Payer: Self-pay | Admitting: Occupational Therapy

## 2021-05-13 ENCOUNTER — Ambulatory Visit: Payer: Federal, State, Local not specified - PPO | Admitting: Occupational Therapy

## 2021-05-13 ENCOUNTER — Other Ambulatory Visit: Payer: Self-pay

## 2021-05-13 DIAGNOSIS — R2689 Other abnormalities of gait and mobility: Secondary | ICD-10-CM | POA: Diagnosis not present

## 2021-05-13 DIAGNOSIS — I69851 Hemiplegia and hemiparesis following other cerebrovascular disease affecting right dominant side: Secondary | ICD-10-CM

## 2021-05-13 DIAGNOSIS — M6281 Muscle weakness (generalized): Secondary | ICD-10-CM | POA: Diagnosis not present

## 2021-05-13 DIAGNOSIS — R278 Other lack of coordination: Secondary | ICD-10-CM

## 2021-05-13 DIAGNOSIS — R2681 Unsteadiness on feet: Secondary | ICD-10-CM | POA: Diagnosis not present

## 2021-05-13 NOTE — Therapy (Signed)
[facility] [address] [place], [place], [address] Phone: [phone]   Fax:  [phone]  Occupational Therapy Treatment  Patient Details  Name: Christine Fritz MRN: [id number] Date of Birth: 14-Feb-1961 Referring Provider (OT): Alger Simons, MD   Encounter Date: 05/13/2021   OT End of Session - 05/13/21 0848     Visit Number 3    Number of Visits 6    Date for OT Re-Evaluation 06/23/21   6 visits over 8 weeks d/t any scheduling conflicts   Authorization Type BCBS    Authorization Time Period $30 copay VL:50 combined No Auth    Authorization - Number of Visits 25    OT Start Time [phone]    OT Stop Time 0930    OT Time Calculation (min) 44 min    Activity Tolerance Patient tolerated treatment well    Behavior During Therapy [facility] for tasks assessed/performed             History reviewed. No pertinent past medical history.  History reviewed. No pertinent surgical history.  There were no vitals filed for this visit.   Subjective Assessment - 05/13/21 0847     Subjective  "my handwriting is getting better - at times"    Pertinent History PMH: none    Limitations Fall Risk    Patient Stated Goals "get back to baking and crafting"    Currently in Pain? No/denies    Pain Score 0-No pain                          OT Treatments/Exercises (OP) - 05/13/21 [id number]       Exercises   Exercises Hand      Hand Exercises   Hand Gripper with Medium Beads hand gripper with 1 inch blocks on level 3 with black spring with RUE with mod drops and difficulty with sustained grasp. downgraded to level 2 with black spring approx 3/4 through blocks      Functional Reaching Activities   Mid Level resistance clothespins with RUE 1-8# working on sustained grasp with min difficulty      Fine Motor Coordination (Hand/Wrist)   Fine Motor Coordination Dealing card with thumb;O'Connor pegs;In hand manipuation training     In Scientist, clinical (histocompatibility and immunogenetics) with cards and then downgraded to checker chips with RUE - pt able to do chips with little difficulty    Dealing card with thumb shuffing and dealing cards with thumb with RUE    O'Connor pegs with tweezers with RUE with mod difficulty                      OT Short Term Goals - 05/13/21 0848       OT SHORT TERM GOAL #1   Title Pt will be independent with HEP for coordination    Time 4    Period Weeks    Status On-going    Target Date 05/26/21      OT SHORT TERM GOAL #2   Title Pt will bake cupcakes with mod I and good safety awareness    Time 4    Period Weeks    Status On-going      OT SHORT TERM GOAL #3   Title Pt will verbalize understanding of adapated strategies and/or equipment for increasing independence and ease with completeing ADLs and IADLs (baking, crafts, etc)    Time 4    Period Weeks  Status On-going               OT Long Term Goals - 04/28/21 1158       OT LONG TERM GOAL #1   Title Pt will be independent with grip strength HEP.    Time 8    Period Weeks    Status New    Target Date 06/23/21      OT LONG TERM GOAL #2   Title Pt will increase grip strength by 5 lbs or greater for increasing strength in dominant hand.    Baseline R 65, L 68.1    Time 8    Period Weeks    Status New      OT LONG TERM GOAL #3   Title Pt will improve legibility and fluidity of handwriting with RUE to 100% of prior level    Baseline 75%    Time 8    Period Weeks    Status New      OT LONG TERM GOAL #4   Title Pt will increase coordination in RUE evidenced by completing 9 hole peg test in 25 seconds or less.    Baseline R 31.13s, L 24.06s    Time 8    Period Weeks    Status New      OT LONG TERM GOAL #5   Title Pt will report baking and decorating cupcakes and/or cake and/or completing crafting task (floral arrangements) with mod I    Time 8    Period Weeks    Status New                   Plan -  05/13/21 0936     Clinical Impression Statement Pt with difficulty with sustained grasp and attention to RUE today with tasks but overall demonstrates good cooridnation and stregnth in RUE.    OT Occupational Profile and History Problem Focused Assessment - Including review of records relating to presenting problem    Occupational performance deficits (Please refer to evaluation for details): IADL's;ADL's;Leisure    Body Structure / Function / Physical Skills ADL;FMC;Decreased knowledge of use of DME;Coordination;IADL;ROM;UE functional use;GMC;Strength;Dexterity    Rehab Potential Good    Clinical Decision Making Limited treatment options, no task modification necessary    Comorbidities Affecting Occupational Performance: None    Modification or Assistance to Complete Evaluation  No modification of tasks or assist necessary to complete eval    OT Frequency 1x / week    OT Duration 8 weeks   6 visits over 8 weeks d/t any scheduling conflicts   OT Treatment/Interventions Self-care/ADL training;DME and/or AE instruction;Functional Mobility Training;Manual Therapy;Patient/family education;Passive range of motion;Neuromuscular education;Therapeutic exercise;Therapeutic activities    Plan handwriting, simple cooking, sustained grasp tasks    Consulted and Agree with Plan of Care Patient             Patient will benefit from skilled therapeutic intervention in order to improve the following deficits and impairments:   Body Structure / Function / Physical Skills: ADL, FMC, Decreased knowledge of use of DME, Coordination, IADL, ROM, UE functional use, GMC, Strength, Dexterity       Visit Diagnosis: 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)  Muscle weakness (generalized)  Unsteadiness on feet  Other lack of coordination    Problem List Patient Active Problem List   Diagnosis Date Noted   Slow transit constipation    Stage  3a chronic kidney disease (HCC)  Essential hypertension    Small vessel disease, cerebrovascular 04/08/2021   Acute CVA (cerebrovascular accident) [facility]) 04/05/2021    Junious Dresser, OT 05/13/2021, 9:37 AM  [facility] [address] [place], [place], [address] Phone: [phone]   Fax:  [phone]  Name: Christine Fritz MRN: [id number] Date of Birth: [birth year]

## 2021-05-19 ENCOUNTER — Ambulatory Visit: Payer: Federal, State, Local not specified - PPO | Admitting: Adult Health

## 2021-05-19 ENCOUNTER — Encounter: Payer: Self-pay | Admitting: Adult Health

## 2021-05-19 VITALS — BP 122/88 | HR 72 | Ht 64.0 in | Wt 240.0 lb

## 2021-05-19 DIAGNOSIS — I10 Essential (primary) hypertension: Secondary | ICD-10-CM

## 2021-05-19 DIAGNOSIS — R29818 Other symptoms and signs involving the nervous system: Secondary | ICD-10-CM | POA: Diagnosis not present

## 2021-05-19 DIAGNOSIS — I639 Cerebral infarction, unspecified: Secondary | ICD-10-CM | POA: Diagnosis not present

## 2021-05-19 DIAGNOSIS — E785 Hyperlipidemia, unspecified: Secondary | ICD-10-CM

## 2021-05-19 DIAGNOSIS — I3139 Other pericardial effusion (noninflammatory): Secondary | ICD-10-CM | POA: Diagnosis not present

## 2021-05-19 NOTE — Progress Notes (Signed)
[facility] [address] [place]. [place] [address] [phone]       HOSPITAL FOLLOW UP NOTE  Ms. Christine Fritz Date of Birth:  [birth year] Medical Record Number:  [id number]   Reason for Referral:  hospital stroke follow up    SUBJECTIVE:   CHIEF COMPLAINT:  Chief Complaint  Patient presents with   Follow-up    Rm 2 here for hospital follow up. Reports she has been doing well.     HPI:   DUA Christine Fritz is a 60 year old female with history of morbid obesity, not seeing doctors 20 years who presented on 04/04/2021 with right upper and lower extremity incoordination.  She had a similar symptoms 1 week ago, not able to get up from floor for 4 hours and resolved. Did not seek medical evaluation.  This time symptoms persistent.  Personally reviewed hospitalization pertinent progress notes, lab work and imaging.  Evaluated by Dr. Roda Shutters.  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  MRA showed right A2 severe stenosis, left ICA severe stenosis.  Carotid Doppler negative, EF 60 to 65% with evidence of moderate pericardial effusion. Eval by cards who rec'd OP f/u.  UDS negative, LDL 259, A1c 5.5, creatinine 1.56.  Etiology for stroke likely due to small vessel disease given uncontrolled risk factor including morbid obesity, severe hyperlipidemia and significant hypertension.  Aggressive stroke risk factor modification advised.  Recommended DAPT for 3 weeks and aspirin alone and initiate atorvastatin 80 mg daily and amlodipine.  Also evidence of hypothyroidism and started on levothyroxine with OP PCP f/u.  Elevated creatinine suspecting CKD but no prior baseline value remained stable during admission.  Therapy evals recommended CIR for right hemiparesis and functional decline.   Today, 05/19/2021, patient being seen for initial hospital follow-up unaccompanied.  Overall doing well.  Working with PT/OT.  Continued right-sided weakness and gait impairment although  continued improvement noted. Use of RW. Not using prior. Did fall 1.5 wks ago - slipped in bathroom - did not hit head. No other falls since then. Lives with husband - doing ADLs and majority of IADLs independently. She is not driving - was brought to visit by her mother.   Completed 3 weeks DAPT -remains on aspirin alone as well as atorvastatin without side effects Blood pressure today 122/88 - on amlodipine 10 mg daily - monitors at home and has been stable PCP follow-up next week - c/o cold intolerance and questions CKD that was noted on her MyChart  Upon further questioning, does endorse prolonged hx of insomnia, snoring, occasional morning headaches and daytime fatigue.  She has not previously been tested for sleep apnea.  Mother does have sleep apnea and uses CPAP at home.  No further concerns at this time     PERTINENT IMAGING  Per hospitalization 04/04/2021 CT head   -Age indeterminate right periventricular lacunar infarct. Mild chronic small vessel disease throughout the deep white matter. No hemorrhage or hydrocephalus   MRI   1. 1 cm acute ischemic nonhemorrhagic infarct involving the posterior limb of the left internal capsule. 2. Remote lacunar infarct at the right basal ganglia/corona radiata. 3. Underlying mild chronic microvascular ischemic disease. 4. Absent flow void within the right vertebral artery within the neck, likely occluded.   MRA   1. Motion degraded exam. 2. Negative intracranial MRA for large vessel occlusion. 3. Severe right A2 stenosis. 4. Question additional severe stenosis at the origin of the left SCA. 5. No other hemodynamically significant  or correctable stenosis.   Carotid Doppler   Right Carotid: Velocities in the right ICA are consistent with a 1-39% stenosis.   Left Carotid: Velocities in the left ICA are consistent with a 1-39%  stenosis.   Vertebrals: Bilateral vertebral arteries demonstrate antegrade flow.  2D Echo Ef 60-65%. No  shunt   LDL 259 HgbA1c 5.5   ROS:   14 system review of systems performed and negative with exception of those listed in HPI  PMH: No past medical history on file.  PSH: No past surgical history on file.  Social History:  Social History   Socioeconomic History   Marital status: Married    Spouse name: Not on file   Number of children: Not on file   Years of education: Not on file   Highest education level: Not on file  Occupational History   Not on file  Tobacco Use   Smoking status: Never   Smokeless tobacco: Never  Substance and Sexual Activity   Alcohol use: Not Currently   Drug use: Not Currently   Sexual activity: Not Currently  Other Topics Concern   Not on file  Social History Narrative   Not on file   Social Determinants of Health   Financial Resource Strain: Not on file  Food Insecurity: Not on file  Transportation Needs: Not on file  Physical Activity: Not on file  Stress: Not on file  Social Connections: Not on file  Intimate Partner Violence: Not on file    Family History:  Family History  Problem Relation Age of Onset   Hypertension Father    Stroke Neg Hx     Medications:   Current Outpatient Medications on File Prior to Visit  Medication Sig Dispense Refill   acetaminophen (TYLENOL) 325 MG tablet Take 2 tablets (650 mg total) by mouth every 4 (four) hours as needed for mild pain (or temp > 37.5 C (99.5 F)).     amLODipine (NORVASC) 10 MG tablet Take 1 tablet (10 mg total) by mouth daily. 30 tablet 0   aspirin EC 81 MG EC tablet Take 1 tablet (81 mg total) by mouth daily. Swallow whole. 30 tablet 11   atorvastatin (LIPITOR) 80 MG tablet Take 1 tablet (80 mg total) by mouth daily. 30 tablet 0   levothyroxine (SYNTHROID) 100 MCG tablet Take 1 tablet (100 mcg total) by mouth daily at 6 (six) AM. 30 tablet 0   magnesium oxide (MAG-OX) 400 MG tablet Take 1 tablet (400 mg total) by mouth daily. 30 tablet 0   rOPINIRole (REQUIP) 1 MG tablet Take 1  tablet (1 mg total) by mouth at bedtime. 30 tablet 0   No current facility-administered medications on file prior to visit.    Allergies:  No Known Allergies    OBJECTIVE:  Physical Exam  Vitals:   05/19/21 0810  BP: 122/88  Pulse: 72  SpO2: 97%  Weight: 240 lb (108.9 kg)  Height: 5\' 4"  (1.626 m)   Body mass index is 41.2 kg/m. No results found.  Post stroke PHQ 2/9 Depression screen PHQ 2/9 05/19/2021  Decreased Interest 0  Down, Depressed, Hopeless 0  PHQ - 2 Score 0     General: Well-developed very pleasant middle-age Caucasian female, seated, in no evident distress Head: head normocephalic and atraumatic.   Neck: supple with no carotid or supraclavicular bruits Cardiovascular: regular rate and rhythm, no murmurs Musculoskeletal: no deformity Skin:  no rash/petichiae Vascular:  Normal pulses all extremities   Neurologic  Exam Mental Status: Awake and fully alert.  Fluent speech and language.  Oriented to place and time. Recent and remote memory intact. Attention span, concentration and fund of knowledge appropriate. Mood and affect appropriate.  Cranial Nerves: Fundoscopic exam reveals sharp disc margins. Pupils equal, briskly reactive to light. Extraocular movements full without nystagmus. Visual fields full to confrontation. Hearing intact. Facial sensation intact. Face, tongue, palate moves normally and symmetrically.  Motor: Normal strength, bulk and tone left upper and lower extremity.  RUE 4+/5 with slightly decreased grip strength and decreased dexterity. RLE 4+/5 HF and ADF Sensory.: intact to touch , pinprick , position and vibratory sensation.  Coordination: Rapid alternating movements normal in all extremities except slightly decreased right hand. Finger-to-nose and heel-to-shin with mild incoordination of right upper and lower extremity consistent with weakness. Gait and Station: Arises from chair without difficulty. Stance is normal. Gait demonstrates  slightly decreased stride length and step height of RLE with use of RW.  tandem walk and heel toe not attempted.  Reflexes: 1+ and symmetric. Toes downgoing.     NIHSS  0 Modified Rankin  3      ASSESSMENT: Christine Fritz is a 60 y.o. year old female with left PLIC infarct on [id number] secondary to small vessel disease. Vascular risk factors include morbid obesity HTN, HLD and prior strokes on imaging. Also dx'd with likely CKD with elevated creatinine, moderate pericardial effusion per 2D echo and hypothyroidism     PLAN:  Left PLIC stroke :  Residual deficit: Mild right hemiparesis and gait impairment.  Continue working with PT/OT for likely ongoing recovery.  Use of RW at all times unless otherwise instructed.  No driving until improved strength/coordination of RLE Continue aspirin 81 mg daily  and atorvastatin 80 mg daily for secondary stroke prevention.   Discussed secondary stroke prevention measures and importance of close PCP follow up for aggressive stroke risk factor management. I have gone over the pathophysiology of stroke, warning signs and symptoms, risk factors and their management in some detail with instructions to go to the closest emergency room for symptoms of concern. HTN: BP goal <130/90.  Stable on amlodipine 10 mg daily per PCP HLD: LDL goal <70. Recent LDL 259.  Continue atorvastatin 80 mg daily - request repeat lipid panel with PCP next week Suspected sleep apnea: Referral to [facility] - recent stroke and prior on imaging, HTN, morbid obesity, chronic insomnia, snoring, AM headaches and daytime fatigue Pericardial effusion: noted on 2D echo - referral to cardiology  Elevated creatinine: new finding but no prior baseline to compare. Concern for CKD during hospitalization. Recommend repeat at f/u with PCP next week Hypothyroidism: on levothyroxine - plan for repeat thyroid panel next week with PCP. Discussed this new dx of hypothyroidism likely contributing  to cold intolerance but advised to further discuss with PCP    Follow up in 4 months or call earlier if needed   Orders Placed This Encounter  Procedures   Ambulatory referral to Sleep Studies    Referral Priority:   Routine    Referral Type:   Consultation    Referral Reason:   Specialty Services Required    Number of Visits Requested:   1   Ambulatory referral to Cardiology    Referral Priority:   Routine    Referral Type:   Consultation    Referral Reason:   Specialty Services Required    Requested Specialty:   Cardiology    Number of Visits  Requested:   1     CC:  [facility] provider: Dr. Leonie Man PCP: Carol Ada, MD    I spent 59 minutes of face-to-face and non-face-to-face time with patient.  This included previsit chart review including review of recent hospitalization, lab review, study review, order entry, electronic health record documentation, patient education regarding recent stroke including etiology, secondary stroke prevention measures and importance of managing stroke risk factors, residual deficits and typical recovery time and answered all other questions as noted above to patients satisfaction  Frann Rider, AGNP-BC  [facility] [address] [place], [place] [address]  Phone [phone] Fax [phone] Note: This document was prepared with digital dictation and possible smart phrase technology. Any transcriptional errors that result from this process are unintentional.

## 2021-05-19 NOTE — Patient Instructions (Addendum)
Continue aspirin 81 mg daily  and atorvastatin 80 mg daily for secondary stroke prevention  Continue to follow up with PCP regarding blood pressure and cholesterol management  Maintain strict control of hypertension with blood pressure goal below 130/90 and cholesterol with LDL cholesterol (bad cholesterol) goal below 70 mg/dL.   Signs of a Stroke? Follow the BEFAST method:  Balance Watch for a sudden loss of balance, trouble with coordination or vertigo Eyes Is there a sudden loss of vision in one or both eyes? Or double vision?  Face: Ask the person to smile. Does one side of the face droop or is it numb?  Arms: Ask the person to raise both arms. Does one arm drift downward? Is there weakness or numbness of a leg? Speech: Ask the person to repeat a simple phrase. Does the speech sound slurred/strange? Is the person confused ? Time: If you observe any of these signs, call 911.  You will be contacted by our sleep clinic to evaluate for possible sleep apnea and contacted by cardiology for further evaluation of pericardial effusion that was seen on your echocardiogram  Please follow up with your PCP regarding hypothyroidism on Synthroid and elevated creatine levels   Continue working with therapies for likely ongoing recovery      Followup in the future with me in 4 months or call earlier if needed       Thank you for coming to see [place] at [facility]. I hope we have been able to provide you high quality care today.  You may receive a patient satisfaction survey over the next few weeks. We would appreciate your feedback and comments so that we may continue to improve ourselves and the health of our patients.  Stroke Prevention Some medical conditions and lifestyle choices can lead to a higher risk for a stroke. You can help to prevent a stroke by eating healthy foods and exercising. It also helps to not smoke and to manage any health problems you may have. How can  this condition affect me? A stroke is an emergency. It should be treated right away. A stroke can lead to brain damage or threaten your life. There is a better chance of surviving and getting better after a stroke if you get medical help right away. What can increase my risk? The following medical conditions may increase your risk of a stroke: Diseases of the heart and blood vessels (cardiovascular disease). High blood pressure (hypertension). Diabetes. High cholesterol. Sickle cell disease. Problems with blood clotting. Being very overweight. Sleeping problems (obstructivesleep apnea). Other risk factors include: Being older than age 39. A history of blood clots, stroke, or mini-stroke (TIA). Race, ethnic background, or a family history of stroke. Smoking or using tobacco products. Taking birth control pills, especially if you smoke. Heavy alcohol and drug use. Not being active. What actions can I take to prevent this? Manage your health conditions High cholesterol. Eat a healthy diet. If this is not enough to manage your cholesterol, you may need to take medicines. Take medicines as told by your doctor. High blood pressure. Try to keep your blood pressure below 130/80. If your blood pressure cannot be managed through a healthy diet and regular exercise, you may need to take medicines. Take medicines as told by your doctor. Ask your doctor if you should check your blood pressure at home. Have your blood pressure checked every year. Diabetes. Eat a healthy diet and get regular exercise. If your blood sugar (glucose) cannot  be managed through diet and exercise, you may need to take medicines. Take medicines as told by your doctor. Talk to your doctor about getting checked for sleeping problems. Signs of a problem can include: Snoring a lot. Feeling very tired. Make sure that you manage any other conditions you have. Nutrition  Follow instructions from your doctor about what to  eat or drink. You may be told to: Eat and drink fewer calories each day. Limit how much salt (sodium) you use to 1,500 milligrams (mg) each day. Use only healthy fats for cooking, such as olive oil, canola oil, and sunflower oil. Eat healthy foods. To do this: Choose foods that are high in fiber. These include whole grains, and fresh fruits and vegetables. Eat at least 5 servings of fruits and vegetables a day. Try to fill one-half of your plate with fruits and vegetables at each meal. Choose low-fat (lean) proteins. These include low-fat cuts of meat, chicken without skin, fish, tofu, beans, and nuts. Eat low-fat dairy products. Avoid foods that: Are high in salt. Have saturated fat. Have trans fat. Have cholesterol. Are processed or pre-made. Count how many carbohydrates you eat and drink each day. Lifestyle If you drink alcohol: Limit how much you have to: 0-1 drink a day for women who are not pregnant. 0-2 drinks a day for men. Know how much alcohol is in your drink. In the [place] one drink equals one 12 oz bottle of beer ( ), one 5 oz glass of wine ( ), or one 1 oz glass of hard liquor (64mL). Do not smoke or use any products that have nicotine or tobacco. If you need help quitting, ask your doctor. Avoid secondhand smoke. Do not use drugs. Activity  Try to stay at a healthy weight. Get at least 30 minutes of exercise on most days, such as: Fast walking. Biking. Swimming. Medicines Take over-the-counter and prescription medicines only as told by your doctor. Avoid taking birth control pills. Talk to your doctor about the risks of taking birth control pills if: You are over 5 years old. You smoke. You get very bad headaches. You have had a blood clot. Where to find more information American Stroke Association: www.strokeassociation.org Get help right away if: You or a loved one has any signs of a stroke. "BE FAST" is an easy way to remember the warning signs: B  - Balance. Dizziness, sudden trouble walking, or loss of balance. E - Eyes. Trouble seeing or a change in how you see. F - Face. Sudden weakness or loss of feeling of the face. The face or eyelid may droop on one side. A - Arms. Weakness or loss of feeling in an arm. This happens all of a sudden and most often on one side of the body. S - Speech. Sudden trouble speaking, slurred speech, or trouble understanding what people say. T - Time. Time to call emergency services. Write down what time symptoms started. You or a loved one has other signs of a stroke, such as: A sudden, very bad headache with no known cause. Feeling like you may vomit (nausea). Vomiting. A seizure. These symptoms may be an emergency. Get help right away. Call your local emergency services (911 in the [place] Do not wait to see if the symptoms will go away. Do not drive yourself to the hospital. Summary You can help to prevent a stroke by eating healthy, exercising, and not smoking. It also helps to manage any health problems you have. Do not smoke or  use any products that contain nicotine or tobacco. Get help right away if you or a loved one has any signs of a stroke. This information is not intended to replace advice given to you by your health care provider. Make sure you discuss any questions you have with your health care provider. Document Revised: 12/09/2019 Document Reviewed: 12/09/2019 Elsevier Patient Education  2022 [employer].

## 2021-05-20 ENCOUNTER — Ambulatory Visit: Payer: Federal, State, Local not specified - PPO

## 2021-05-20 ENCOUNTER — Other Ambulatory Visit: Payer: Self-pay

## 2021-05-20 VITALS — BP 140/90

## 2021-05-20 DIAGNOSIS — R2689 Other abnormalities of gait and mobility: Secondary | ICD-10-CM | POA: Diagnosis not present

## 2021-05-20 DIAGNOSIS — M6281 Muscle weakness (generalized): Secondary | ICD-10-CM | POA: Diagnosis not present

## 2021-05-20 DIAGNOSIS — R278 Other lack of coordination: Secondary | ICD-10-CM | POA: Diagnosis not present

## 2021-05-20 DIAGNOSIS — I69851 Hemiplegia and hemiparesis following other cerebrovascular disease affecting right dominant side: Secondary | ICD-10-CM | POA: Diagnosis not present

## 2021-05-20 DIAGNOSIS — R2681 Unsteadiness on feet: Secondary | ICD-10-CM | POA: Diagnosis not present

## 2021-05-20 NOTE — Therapy (Signed)
[facility] [address] [place], [place], [address] Phone: [phone]   Fax:  [phone]  Physical Therapy Treatment  Patient Details  Name: Christine Fritz MRN: [id number] Date of Birth: [birth year] Referring Provider (PT): Meredith Staggers, MD   Encounter Date: 05/20/2021   PT End of Session - 05/20/21 1105     Visit Number 3    Number of Visits 17   do not anticipate pt needing all visits   Date for PT Re-Evaluation 07/28/21    Authorization Type Federal BC[employer] - $30 copay, VL combined with pt, ot, st: 14    Authorization - Number of Visits 60    PT Start Time 1103    PT Stop Time 1143    PT Time Calculation (min) 40 min    Equipment Utilized During Treatment Gait belt    Activity Tolerance Patient tolerated treatment well    Behavior During Therapy [facility] for tasks assessed/performed             History reviewed. No pertinent past medical history.  History reviewed. No pertinent surgical history.  Vitals:   05/20/21 1107  BP: 140/90     Subjective Assessment - 05/20/21 1105     Subjective Pt denies any new falls. Reports that she is doing better with exercises at home. Pt saw neurologist yesterday.    Pertinent History PMH: CVA, HTN, stage 3 CKD    Limitations Walking;House hold activities    Diagnostic tests CT/MRI showed a 1 cm acute ischemic nonhemorrhagic infarct involving the posterior limb of the left internal capsule.  Remote lacunar infarct of the right basal ganglia/corona radiata.    Patient Stated Goals wants to get back to normal - walking with no device, baking/decorating cakes.    Currently in Pain? No/denies                               [facility] Adult PT Treatment/Exercise - 05/20/21 1110       Ambulation/Gait   Ambulation/Gait Yes    Ambulation/Gait Assistance 5: Supervision;4: Min guard    Ambulation/Gait Assistance Details Pt at times staggers slightly. Verbal  cues for heel strike consistently    Ambulation Distance (Feet) 460 Feet    Assistive device None    Gait Pattern Step-through pattern;Decreased stance time - right;Decreased step length - right;Decreased arm swing - right;Decreased dorsiflexion - right    Ambulation Surface Level;Indoor      Neuro Re-ed    Neuro Re-ed Details  Dynamic gait activities over obstacle course: weaving in and out of 5 cones and marching over blue mat x 4 CGA, reciprocal stepping over cones with tapping first x 4 bouts CGA. Pt challenged at times with lifting RLE all the way off cone. Side stepping over 5 cones x 2 each direction with verbal cues to go up and over CGA.      Exercises   Exercises Other Exercises    Other Exercises  Forward and lateral step-ups on RLE x 10 each with holding to railing.                     PT Education - 05/20/21 1955     Education Details Added step-ups to HEP. Pt was advised that she could walk in home without walker other than at night or first thing in morning if feeling less steady.    Person(s) Educated Patient  Methods Explanation;Demonstration;Handout    Comprehension Verbalized understanding              PT Short Term Goals - 05/11/21 1330       PT SHORT TERM GOAL #1   Title Pt will be independent with HEP in order to build upon functional gains made in therapy. ALL STGS DUE 05/27/21    Time 4    Period Weeks    Status New    Target Date 05/27/21      PT SHORT TERM GOAL #2   Title Pt will improve BERG score to at least a 52/55 in order to decr fall risk    Baseline 48/56    Time 4    Period Weeks    Status Revised      PT SHORT TERM GOAL #3   Title Pt will decr 5x sit <> stand without UE support to 15.5 seconds or less in order to demo decr fall risk/improved functional BLE strength    Time 4    Period Weeks    Status New      PT SHORT TERM GOAL #4   Title Pt will improve gait speed to at least 3.1 ft/sec with no AD vs. LRAD in order to  demo improved gait efficiency.    Baseline 2.87 ft/sec    Time 4    Period Weeks    Status New      PT SHORT TERM GOAL #5   Title Perform DGI/FGA with LTG written.    Time 4    Period Weeks    Status New               PT Long Term Goals - 05/11/21 1331       PT LONG TERM GOAL #1   Title Pt will be independent with final HEP in order to build upon functional gains made in therapy    Time 8    Period Weeks    Status New      PT LONG TERM GOAL #2   Title Pt will improve gait speed to at least 3.4 ft/sec with no AD vs. LRAD in order to demo improved gait efficiency.    Baseline 2.87 ft/sec    Time 8    Period Weeks    Status New      PT LONG TERM GOAL #3   Title DGI/FGA score to be written.    Time 8    Period Weeks    Status Revised      PT LONG TERM GOAL #4   Title Pt will go up and down 4 steps with single handrail and step through pattern with mod I in order to safely enter/exit home.    Time 8    Period Weeks    Status New      PT LONG TERM GOAL #5   Title Pt will ambulate at least 500' on unlevel outdoor surfaces with mod I with LRAD vs. no AD in order to demo improved community mobility.    Time 8    Period Weeks    Status New                   Plan - 05/20/21 1957     Clinical Impression Statement PT continued to work on gait with no AD. Pt was challenged some with turning and stepping over objects but did well with straightaways. Able to show improved foot clearance on right with practice but  did fatigue with longer bouts.    Personal Factors and Comorbidities Comorbidity 3+;Past/Current Experience    Comorbidities PMH: CVA, HTN, stage 3 CKD    Examination-Activity Limitations Transfers;Stairs;Locomotion Level;Carry;Lift    Examination-Participation Restrictions Community Activity;Driving   baking, crafting   Stability/Clinical Decision Making Evolving/Moderate complexity    Rehab Potential Good    PT Frequency 2x / week    PT Duration 12  weeks   do not anticipate pt needing all visits   PT Treatment/Interventions ADLs/Self Care Home Management;Aquatic Therapy;DME Instruction;Gait training;Functional mobility training;Stair training;Therapeutic activities;Therapeutic exercise;Balance training;Neuromuscular re-education;Patient/family education;Vestibular    PT Next Visit Plan STG check due end of next week. Perform FGA. Continue gait with no AD. RLE strengthening and balance activities - working on SLS, narrow BOS, unlevel surfaces.    PT Home Exercise Plan [email or web address]    Consulted and Agree with Plan of Care Patient             Patient will benefit from skilled therapeutic intervention in order to improve the following deficits and impairments:  Abnormal gait, Decreased balance, Decreased coordination, Decreased strength, Difficulty walking, Postural dysfunction, Obesity  Visit Diagnosis: Other abnormalities of gait and mobility  Muscle weakness (generalized)     Problem List Patient Active Problem List   Diagnosis Date Noted   Slow transit constipation    Stage 3a chronic kidney disease ([place])    Essential hypertension    Small vessel disease, cerebrovascular 04/08/2021   Acute CVA (cerebrovascular accident) ([place]) 04/05/2021    Electa Sniff, PT, DPT, NCS 05/20/2021, 8:00 PM  [facility] [address] [place], [place], [address] Phone: [phone]   Fax:  [phone]  Name: Christine Fritz MRN: [id number] Date of Birth: 1961-01-17

## 2021-05-20 NOTE — Patient Instructions (Signed)
Access Code: [email or web address] URL: [email or web address] Date: 05/20/2021 Prepared by: Elmer Bales  Exercises Staggered Sit-to-Stand - 2 x daily - 5 x weekly - 1-2 sets - 10 reps Standing Marching - 1-2 x daily - 5 x weekly - 2 sets - 10 reps Standing Single Leg Stance with Counter Support - 2 x daily - 5 x weekly - 3 sets - 10 hold Standing Tandem Balance with Counter Support - 2 x daily - 5 x weekly - 3 sets - 15 hold Romberg Stance with Eyes Closed - 2 x daily - 5 x weekly - 2 sets - 5 reps Forward Step Up - 1 x daily - 5 x weekly - 2 sets - 10 reps Lateral Step Ups - 1 x daily - 5 x weekly - 2 sets - 10 reps

## 2021-05-21 ENCOUNTER — Ambulatory Visit: Payer: Federal, State, Local not specified - PPO | Admitting: Occupational Therapy

## 2021-05-21 ENCOUNTER — Encounter: Payer: Self-pay | Admitting: Occupational Therapy

## 2021-05-21 DIAGNOSIS — M6281 Muscle weakness (generalized): Secondary | ICD-10-CM | POA: Diagnosis not present

## 2021-05-21 DIAGNOSIS — R278 Other lack of coordination: Secondary | ICD-10-CM | POA: Diagnosis not present

## 2021-05-21 DIAGNOSIS — I69851 Hemiplegia and hemiparesis following other cerebrovascular disease affecting right dominant side: Secondary | ICD-10-CM | POA: Diagnosis not present

## 2021-05-21 DIAGNOSIS — R2689 Other abnormalities of gait and mobility: Secondary | ICD-10-CM | POA: Diagnosis not present

## 2021-05-21 DIAGNOSIS — R2681 Unsteadiness on feet: Secondary | ICD-10-CM | POA: Diagnosis not present

## 2021-05-21 NOTE — Therapy (Signed)
[facility] [address] [place], [place], [address] Phone: [phone]   Fax:  [phone]  Occupational Therapy Treatment  Patient Details  Name: Christine Fritz MRN: [id number] Date of Birth: 1961/05/09 Referring Provider (OT): Faith Rogue, MD   Encounter Date: 05/21/2021   OT End of Session - 05/21/21 0807     Visit Number 4    Number of Visits 6    Date for OT Re-Evaluation 06/23/21   6 visits over 8 weeks d/t any scheduling conflicts   Authorization Type BCBS    Authorization Time Period $30 copay VL:50 combined No Auth    Authorization - Number of Visits 25    OT Start Time 0803    OT Stop Time 0845    OT Time Calculation (min) 42 min    Activity Tolerance Patient tolerated treatment well    Behavior During Therapy [facility] for tasks assessed/performed             History reviewed. No pertinent past medical history.  History reviewed. No pertinent surgical history.  There were no vitals filed for this visit.   Subjective Assessment - 05/21/21 0806     Subjective  pt came in saying she tried baking - reports it "went" but not as well as she had hoped    Pertinent History PMH: none    Limitations Fall Risk    Patient Stated Goals "get back to baking and crafting"    Currently in Pain? No/denies    Pain Score 0-No pain                          OT Treatments/Exercises (OP) - 05/21/21 2585       ADLs   Cooking made scrambled eggs with good safety awareness and bimanual coordintion with scrambling egg, pouring into frying pan and manipulating egg while cooks. Pt completed with mod I      Hand Exercises   Other Hand Exercises hand gripper with 1 inch blocks on level 3 with black spring with RUE with min drops and difficulty with sustained grasp.      Fine Motor Coordination (Hand/Wrist)   Fine Motor Coordination Grooved pegs    Grooved pegs with RUE with in hand manipulation with  placing pegs into board and removing                      OT Short Term Goals - 05/13/21 0848       OT SHORT TERM GOAL #1   Title Pt will be independent with HEP for coordination    Time 4    Period Weeks    Status On-going    Target Date 05/26/21      OT SHORT TERM GOAL #2   Title Pt will bake cupcakes with mod I and good safety awareness    Time 4    Period Weeks    Status On-going      OT SHORT TERM GOAL #3   Title Pt will verbalize understanding of adapated strategies and/or equipment for increasing independence and ease with completeing ADLs and IADLs (baking, crafts, etc)    Time 4    Period Weeks    Status On-going               OT Long Term Goals - 04/28/21 1158       OT LONG TERM GOAL #1   Title Pt will  be independent with grip strength HEP.    Time 8    Period Weeks    Status New    Target Date 06/23/21      OT LONG TERM GOAL #2   Title Pt will increase grip strength by 5 lbs or greater for increasing strength in dominant hand.    Baseline R 65, L 68.1    Time 8    Period Weeks    Status New      OT LONG TERM GOAL #3   Title Pt will improve legibility and fluidity of handwriting with RUE to 100% of prior level    Baseline 75%    Time 8    Period Weeks    Status New      OT LONG TERM GOAL #4   Title Pt will increase coordination in RUE evidenced by completing 9 hole peg test in 25 seconds or less.    Baseline R 31.13s, L 24.06s    Time 8    Period Weeks    Status New      OT LONG TERM GOAL #5   Title Pt will report baking and decorating cupcakes and/or cake and/or completing crafting task (floral arrangements) with mod I    Time 8    Period Weeks    Status New                   Plan - 05/21/21 0839     Clinical Impression Statement Pt reports doing some baking. Pt with mod I with cooking today for simple tasks. Pt reports some difficulty still with coordination and strength with RUE    OT Occupational Profile and  History Problem Focused Assessment - Including review of records relating to presenting problem    Occupational performance deficits (Please refer to evaluation for details): IADL's;ADL's;Leisure    Body Structure / Function / Physical Skills ADL;FMC;Decreased knowledge of use of DME;Coordination;IADL;ROM;UE functional use;GMC;Strength;Dexterity    Rehab Potential Good    Clinical Decision Making Limited treatment options, no task modification necessary    Comorbidities Affecting Occupational Performance: None    Modification or Assistance to Complete Evaluation  No modification of tasks or assist necessary to complete eval    OT Frequency 1x / week    OT Duration 8 weeks   6 visits over 8 weeks d/t any scheduling conflicts   OT Treatment/Interventions Self-care/ADL training;DME and/or AE instruction;Functional Mobility Training;Manual Therapy;Patient/family education;Passive range of motion;Neuromuscular education;Therapeutic exercise;Therapeutic activities    Plan handwriting, sustained grasp tasks    Consulted and Agree with Plan of Care Patient             Patient will benefit from skilled therapeutic intervention in order to improve the following deficits and impairments:   Body Structure / Function / Physical Skills: ADL, FMC, Decreased knowledge of use of DME, Coordination, IADL, ROM, UE functional use, GMC, Strength, Dexterity       Visit Diagnosis: Other abnormalities of gait and mobility  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)  Unsteadiness on feet  Muscle weakness (generalized)  Other lack of coordination    Problem List Patient Active Problem List   Diagnosis Date Noted   Slow transit constipation    Stage 3a chronic kidney disease ([place])    Essential hypertension    Small vessel disease, cerebrovascular 04/08/2021   Acute CVA (cerebrovascular accident) ([place]) 04/05/2021    Zachery Conch,  OT 05/21/2021, 8:40 AM  [facility] [address] [place], [place], [address] Phone: [phone]   Fax:  [phone]  Name: Christine Fritz MRN: [id number] Date of Birth: 1961-04-01

## 2021-05-24 NOTE — Progress Notes (Signed)
I agree with the above plan 

## 2021-05-25 ENCOUNTER — Encounter: Payer: Self-pay | Admitting: Physical Therapy

## 2021-05-25 ENCOUNTER — Ambulatory Visit
Payer: Federal, State, Local not specified - PPO | Attending: Physical Medicine & Rehabilitation | Admitting: Physical Therapy

## 2021-05-25 ENCOUNTER — Encounter: Payer: Self-pay | Admitting: Occupational Therapy

## 2021-05-25 ENCOUNTER — Ambulatory Visit: Payer: Federal, State, Local not specified - PPO | Admitting: Occupational Therapy

## 2021-05-25 ENCOUNTER — Other Ambulatory Visit: Payer: Self-pay

## 2021-05-25 VITALS — BP 128/90 | HR 94

## 2021-05-25 DIAGNOSIS — R2689 Other abnormalities of gait and mobility: Secondary | ICD-10-CM | POA: Diagnosis not present

## 2021-05-25 DIAGNOSIS — R2681 Unsteadiness on feet: Secondary | ICD-10-CM

## 2021-05-25 DIAGNOSIS — R278 Other lack of coordination: Secondary | ICD-10-CM

## 2021-05-25 DIAGNOSIS — I69851 Hemiplegia and hemiparesis following other cerebrovascular disease affecting right dominant side: Secondary | ICD-10-CM | POA: Diagnosis not present

## 2021-05-25 DIAGNOSIS — M6281 Muscle weakness (generalized): Secondary | ICD-10-CM | POA: Diagnosis not present

## 2021-05-25 NOTE — Patient Instructions (Signed)
1. Grip Strengthening (Resistive Putty)   Squeeze putty using thumb and all fingers. Repeat _20___ times. Do __2__ sessions per day.   2. Roll putty into tube on table and pinch between each finger and thumb x 10 reps each. (can do ring and small finger together)     Copyright  VHI. All rights reserved.   

## 2021-05-25 NOTE — Therapy (Addendum)
[facility] [address] [place], [place], [address] Phone: [phone]   Fax:  [phone]  Physical Therapy Treatment  Patient Details  Name: Christine Fritz MRN: [id number] Date of Birth: Apr 10, 1961 Referring Provider (PT): Meredith Staggers, MD   Encounter Date: 05/25/2021   PT End of Session - 05/25/21 0928     Visit Number 4    Number of Visits 17   do not anticipate pt needing all visits   Date for PT Re-Evaluation 07/28/21    Authorization Type Federal BCBS - $30 copay, VL combined with pt, ot, st: 86    Authorization - Number of Visits 60    PT Start Time 0927    PT Stop Time 1012    PT Time Calculation (min) 45 min    Equipment Utilized During Treatment Gait belt    Activity Tolerance Patient tolerated treatment well    Behavior During Therapy [facility] for tasks assessed/performed             History reviewed. No pertinent past medical history.  History reviewed. No pertinent surgical history.  Vitals:   05/25/21 0931 05/25/21 1004  BP: (!) 122/92 128/90  Pulse: 87 94     Subjective Assessment - 05/25/21 0929     Subjective No changes, no falls. Doing good with exercises. Sees her PCP on thursday.    Pertinent History PMH: CVA, HTN, stage 3 CKD    Limitations Walking;House hold activities    Diagnostic tests CT/MRI showed a 1 cm acute ischemic nonhemorrhagic infarct involving the posterior limb of the left internal capsule.  Remote lacunar infarct of the right basal ganglia/corona radiata.    Patient Stated Goals wants to get back to normal - walking with no device, baking/decorating cakes.    Currently in Pain? No/denies                                    Balance Exercises - 05/25/21 0950       Balance Exercises: Standing   Standing Eyes Opened Narrow base of support (BOS);Limitations    Standing Eyes Opened Limitations On air ex, 2 x 10 reps head turns, x10 reps head  nods    Standing Eyes Closed Foam/compliant surface;4 reps;30 secs;Limitations    Standing Eyes Closed Limitations Feet hip width > feet together, cues for soft bend in the knees    Tandem Stance Eyes open;Foam/compliant surface;4 reps;Limitations    Tandem Stance Time With RLE posterior, partial tandem > full, with intermittent UE support   SLS with Vectors Foam/compliant surface;Limitations    SLS with Vectors Limitations At bottom of staircase on air ex, toe taps standing on RLE, x10 reps to 1st step, x10 reps to 1st step then 2nd step - cues to have a soft bend in the knees.    Tandem Gait Forward;3 reps;Limitations    Tandem Gait Limitations Down and back 3 reps, at countertop on blue mat, using UE support as needed    Retro Gait 2 reps;Foam/compliant surface;Limitations    Retro Gait Limitations On blue mat, down and back x2 reps, cues for step length and weight shift    Step Over Hurdles / Cones Standing on blue mat; stepping over 4 smaller orange obstacles, beginning with step to pattern and one rep with RLE as stance leg then LLE as stance leg, down and back x2 reps, cues for RLE  hip/knee flexion when clearing.    Other Standing Exercises In mini squat position, side stepping down and back x3 reps at countertop, cues for proper squat technique.                  PT Short Term Goals - 05/11/21 1330       PT SHORT TERM GOAL #1   Title Pt will be independent with HEP in order to build upon functional gains made in therapy. ALL STGS DUE 05/27/21    Time 4    Period Weeks    Status New    Target Date 05/27/21      PT SHORT TERM GOAL #2   Title Pt will improve BERG score to at least a 52/55 in order to decr fall risk    Baseline 48/56    Time 4    Period Weeks    Status Revised      PT SHORT TERM GOAL #3   Title Pt will decr 5x sit <> stand without UE support to 15.5 seconds or less in order to demo decr fall risk/improved functional BLE strength    Time 4    Period Weeks     Status New      PT SHORT TERM GOAL #4   Title Pt will improve gait speed to at least 3.1 ft/sec with no AD vs. LRAD in order to demo improved gait efficiency.    Baseline 2.87 ft/sec    Time 4    Period Weeks    Status New      PT SHORT TERM GOAL #5   Title Perform DGI/FGA with LTG written.    Time 4    Period Weeks    Status New               PT Long Term Goals - 05/11/21 1331       PT LONG TERM GOAL #1   Title Pt will be independent with final HEP in order to build upon functional gains made in therapy    Time 8    Period Weeks    Status New      PT LONG TERM GOAL #2   Title Pt will improve gait speed to at least 3.4 ft/sec with no AD vs. LRAD in order to demo improved gait efficiency.    Baseline 2.87 ft/sec    Time 8    Period Weeks    Status New      PT LONG TERM GOAL #3   Title DGI/FGA score to be written.    Time 8    Period Weeks    Status Revised      PT LONG TERM GOAL #4   Title Pt will go up and down 4 steps with single handrail and step through pattern with mod I in order to safely enter/exit home.    Time 8    Period Weeks    Status New      PT LONG TERM GOAL #5   Title Pt will ambulate at least 500' on unlevel outdoor surfaces with mod I with LRAD vs. no AD in order to demo improved community mobility.    Time 8    Period Weeks    Status New                   Plan - 05/25/21 1016     Clinical Impression Statement Today's skilled session focused on standing balance strategies on unlevel surfaces  working on vestibular input for balance, SLS, and also hip strengthening. Pt challenged by SLS tasks on RLE, needs cues throughout session for knee control to avoid locking out knees. Will continue to progress towards LTGs.    Personal Factors and Comorbidities Comorbidity 3+;Past/Current Experience    Comorbidities PMH: CVA, HTN, stage 3 CKD    Examination-Activity Limitations Transfers;Stairs;Locomotion Level;Carry;Lift     Examination-Participation Restrictions Community Activity;Driving   baking, crafting   Stability/Clinical Decision Making Evolving/Moderate complexity    Rehab Potential Good    PT Frequency 2x / week    PT Duration 12 weeks   do not anticipate pt needing all visits   PT Treatment/Interventions ADLs/Self Care Home Management;Aquatic Therapy;DME Instruction;Gait training;Functional mobility training;Stair training;Therapeutic activities;Therapeutic exercise;Balance training;Neuromuscular re-education;Patient/family education;Vestibular    PT Next Visit Plan monitor BP. STG check due end of next week. Perform FGA. Continue gait with no AD. RLE strengthening and balance activities - working on SLS, narrow BOS, unlevel surfaces.    PT Home Exercise Plan [email or web address]    Consulted and Agree with Plan of Care Patient             Patient will benefit from skilled therapeutic intervention in order to improve the following deficits and impairments:  Abnormal gait, Decreased balance, Decreased coordination, Decreased strength, Difficulty walking, Postural dysfunction, Obesity  Visit Diagnosis: Other abnormalities of gait and mobility  Unsteadiness on feet  Muscle weakness (generalized)  Hemiplegia and hemiparesis following other cerebrovascular disease affecting right dominant side [facility])     Problem List Patient Active Problem List   Diagnosis Date Noted   Slow transit constipation    Stage 3a chronic kidney disease ([place])    Essential hypertension    Small vessel disease, cerebrovascular 04/08/2021   Acute CVA (cerebrovascular accident) ([place]) 04/05/2021    Arliss Journey, PT, DPT  05/25/2021, 12:00 PM  [facility] [address] [place], [place], [address] Phone: [phone]   Fax:  [phone]  Name: TYNESHA FEBLES MRN: [id number] Date of Birth: [birth year]

## 2021-05-25 NOTE — Therapy (Signed)
[facility] [address] [place], [place], [address] Phone: [phone]   Fax:  [phone]  Occupational Therapy Treatment  Patient Details  Name: Christine Fritz MRN: [id number] Date of Birth: 1960/08/14 Referring Provider (OT): Alger Simons, MD   Encounter Date: 05/25/2021   OT End of Session - 05/25/21 1019     Visit Number 5    Number of Visits 6    Date for OT Re-Evaluation 06/23/21   6 visits over 8 weeks d/t any scheduling conflicts   Authorization Type BCBS    Authorization Time Period $30 copay VL:50 combined No Auth    Authorization - Number of Visits 25    OT Start Time 1018    OT Stop Time 1100    OT Time Calculation (min) 42 min    Activity Tolerance Patient tolerated treatment well    Behavior During Therapy [facility] for tasks assessed/performed             History reviewed. No pertinent past medical history.  History reviewed. No pertinent surgical history.  There were no vitals filed for this visit.   Subjective Assessment - 05/25/21 1019     Subjective  "My weekend was busy"    Pertinent History PMH: none    Limitations Fall Risk    Patient Stated Goals "get back to baking and crafting"    Currently in Pain? No/denies    Pain Score 0-No pain             Theraputty - issued red theraputty see pt instructions.  Reviewed and checked goals.    Nuts and Bolts with emphasis on vision occluded and twisting nuts with RUE with min/mod difficulty. No drops   hand gripper with 1 inch blocks on level 2 with black spring with RUE with min drops and difficulty with sustained grasp.   Arm Bike: for conditioning and reciprocal movements on Level 1 for 6 minutes (forward and backward)               OT Education - 05/25/21 1037     Education Details issued red theraputty - see pt instructions    Person(s) Educated Patient    Methods Explanation;Demonstration;Handout     Comprehension Verbalized understanding;Returned demonstration              OT Short Term Goals - 05/25/21 1021       OT SHORT TERM GOAL #1   Title Pt will be independent with HEP for coordination    Time 4    Period Weeks    Status Achieved    Target Date 05/26/21      OT SHORT TERM GOAL #2   Title Pt will bake cupcakes with mod I and good safety awareness    Time 4    Period Weeks    Status Achieved      OT SHORT TERM GOAL #3   Title Pt will verbalize understanding of adapated strategies and/or equipment for increasing independence and ease with completeing ADLs and IADLs (baking, crafts, etc)    Time 4    Period Weeks    Status Achieved               OT Long Term Goals - 05/25/21 1023       OT LONG TERM GOAL #1   Title Pt will be independent with grip strength HEP.    Time 8    Period Weeks    Status New  Target Date 06/23/21      OT LONG TERM GOAL #2   Title Pt will increase grip strength by 5 lbs or greater for increasing strength in dominant hand.    Baseline R 65, L 68.1    Time 8    Period Weeks    Status On-going      OT LONG TERM GOAL #3   Title Pt will improve legibility and fluidity of handwriting with RUE to 100% of prior level    Baseline 75%    Time 8    Period Weeks    Status On-going      OT LONG TERM GOAL #4   Title Pt will increase coordination in RUE evidenced by completing 9 hole peg test in 25 seconds or less.    Baseline R 31.13s, L 24.06s    Time 8    Period Weeks    Status On-going   26.63s RUE 05/25/2021     OT LONG TERM GOAL #5   Title Pt will report baking and decorating cupcakes and/or cake and/or completing crafting task (floral arrangements) with mod I    Time 8    Period Weeks    Status On-going   decorating and making cupcakes with mod I and good safety however reports not very well                  Plan - 05/25/21 1043     Clinical Impression Statement checked goals- pt met all STGs. and  progressing towards LTGs. Continue to wok towards goals. renewal next session to accomodate for continuing POC for 5 additional weeks.    OT Occupational Profile and History Problem Focused Assessment - Including review of records relating to presenting problem    Occupational performance deficits (Please refer to evaluation for details): IADL's;ADL's;Leisure    Body Structure / Function / Physical Skills ADL;FMC;Decreased knowledge of use of DME;Coordination;IADL;ROM;UE functional use;GMC;Strength;Dexterity    Rehab Potential Good    Clinical Decision Making Limited treatment options, no task modification necessary    Comorbidities Affecting Occupational Performance: None    Modification or Assistance to Complete Evaluation  No modification of tasks or assist necessary to complete eval    OT Frequency 1x / week    OT Duration 8 weeks   6 visits over 8 weeks d/t any scheduling conflicts   OT Treatment/Interventions Self-care/ADL training;DME and/or AE instruction;Functional Mobility Training;Manual Therapy;Patient/family education;Passive range of motion;Neuromuscular education;Therapeutic exercise;Therapeutic activities    Plan , grip strength, renewal next session for 5 additional weeks.    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 [place], [place], [address] Phone: [phone]   Fax:  [phone]  Name: Christine Fritz MRN: [id number] Date of Birth: 1961/04/22

## 2021-05-27 ENCOUNTER — Other Ambulatory Visit: Payer: Self-pay

## 2021-05-27 ENCOUNTER — Ambulatory Visit: Payer: Federal, State, Local not specified - PPO | Admitting: Physical Therapy

## 2021-05-27 VITALS — BP 142/92 | HR 88

## 2021-05-27 DIAGNOSIS — R2681 Unsteadiness on feet: Secondary | ICD-10-CM | POA: Diagnosis not present

## 2021-05-27 DIAGNOSIS — R2689 Other abnormalities of gait and mobility: Secondary | ICD-10-CM | POA: Diagnosis not present

## 2021-05-27 DIAGNOSIS — I10 Essential (primary) hypertension: Secondary | ICD-10-CM | POA: Diagnosis not present

## 2021-05-27 DIAGNOSIS — R278 Other lack of coordination: Secondary | ICD-10-CM | POA: Diagnosis not present

## 2021-05-27 DIAGNOSIS — E039 Hypothyroidism, unspecified: Secondary | ICD-10-CM | POA: Diagnosis not present

## 2021-05-27 DIAGNOSIS — I69851 Hemiplegia and hemiparesis following other cerebrovascular disease affecting right dominant side: Secondary | ICD-10-CM

## 2021-05-27 DIAGNOSIS — G2581 Restless legs syndrome: Secondary | ICD-10-CM | POA: Diagnosis not present

## 2021-05-27 DIAGNOSIS — I679 Cerebrovascular disease, unspecified: Secondary | ICD-10-CM | POA: Diagnosis not present

## 2021-05-27 DIAGNOSIS — E782 Mixed hyperlipidemia: Secondary | ICD-10-CM | POA: Diagnosis not present

## 2021-05-27 DIAGNOSIS — M6281 Muscle weakness (generalized): Secondary | ICD-10-CM

## 2021-05-27 NOTE — Therapy (Signed)
[facility] [address] [place], [place], [address] Phone: [phone]   Fax:  [phone]  Physical Therapy Treatment  Patient Details  Name: Christine Fritz MRN: [id number] Date of Birth: May 20, 1961 Referring Provider (PT): Meredith Staggers, MD   Encounter Date: 05/27/2021   PT End of Session - 05/27/21 1149     Visit Number 5    Number of Visits 17   do not anticipate pt needing all visits   Date for PT Re-Evaluation 07/28/21    Authorization Type [employer] - $30 copay, VL combined with pt, ot, st: 60    Authorization - Number of Visits 60    PT Start Time 1147    PT Stop Time 1230    PT Time Calculation (min) 43 min    Equipment Utilized During Treatment Gait belt    Activity Tolerance Patient tolerated treatment well    Behavior During Therapy [facility] for tasks assessed/performed             No past medical history on file.  No past surgical history on file.  Vitals:   05/27/21 1151 05/27/21 1157 05/27/21 1219  BP: (!) 148/99 (!) 144/94 (!) 142/92  Pulse: 88       Subjective Assessment - 05/27/21 1149     Subjective Sees her PCP doctor this afternoon. Reports her R knee has been bothering her some.    Pertinent History PMH: CVA, HTN, stage 3 CKD    Limitations Walking;House hold activities    Diagnostic tests CT/MRI showed a 1 cm acute ischemic nonhemorrhagic infarct involving the posterior limb of the left internal capsule.  Remote lacunar infarct of the right basal ganglia/corona radiata.    Patient Stated Goals wants to get back to normal - walking with no device, baking/decorating cakes.    Currently in Pain? No/denies                [facility] PT Assessment - 05/27/21 1204       Functional Gait  Assessment   Gait assessed  Yes    Gait Level Surface Walks 20 ft in less than 7 sec but greater than 5.5 sec, uses assistive device, slower speed, mild gait deviations, or deviates 6-10 in outside  of the 12 in walkway width.   6.37   Change in Gait Speed Able to smoothly change walking speed without loss of balance or gait deviation. Deviate no more than 6 in outside of the 12 in walkway width.    Gait with Horizontal Head Turns Performs head turns smoothly with slight change in gait velocity (eg, minor disruption to smooth gait path), deviates 6-10 in outside 12 in walkway width, or uses an assistive device.    Gait with Vertical Head Turns Performs task with slight change in gait velocity (eg, minor disruption to smooth gait path), deviates 6 - 10 in outside 12 in walkway width or uses assistive device    Gait and Pivot Turn Pivot turns safely within 3 sec and stops quickly with no loss of balance.    Step Over Obstacle Is able to step over one shoe box (4.5 in total height) without changing gait speed. No evidence of imbalance.    Gait with Narrow Base of Support Ambulates less than 4 steps heel to toe or cannot perform without assistance.    Gait with Eyes Closed Walks 20 ft, uses assistive device, slower speed, mild gait deviations, deviates 6-10 in outside 12 in  walkway width. Ambulates 20 ft in less than 9 sec but greater than 7 sec.   7.97 seconds   Ambulating Backwards Walks 20 ft, slow speed, abnormal gait pattern, evidence for imbalance, deviates 10-15 in outside 12 in walkway width.   18.22 seconds   Steps Two feet to a stair, must use rail.    Total Score 18    FGA comment: 18/30 = high fall risk                           [facility] Adult PT Treatment/Exercise - 05/27/21 1204       Transfers   Transfers Sit to Stand;Stand to Sit    Sit to Stand 5: Supervision    Five time sit to stand comments  11.16 seconds with no UE support    Stand to Sit 5: Supervision      Ambulation/Gait   Ambulation/Gait Yes    Ambulation/Gait Assistance 5: Supervision    Assistive device None    Gait Pattern Step-through pattern;Decreased stance time - right;Decreased step length -  right;Decreased arm swing - right;Decreased dorsiflexion - right    Ambulation Surface Level;Indoor    Gait velocity 9.4 seconds = 3.47 seconds no AD                 Balance Exercises - 05/27/21 1239       Balance Exercises: Standing   Stepping Strategy Posterior;Anterior;Foam/compliant surface;10 reps;Limitations    Stepping Strategy Limitations Alternating standing on blue foam beam, x10 reps each direction, cued for foot clearance    Rockerboard Anterior/posterior;Limitations    Rockerboard Limitations x10 reps weight shifting with cues for hip/ankle strategy, trying to keep board steady x10 reps head turns, x10 reps head nods                PT Education - 05/27/21 1237     Education Details Progress towards goals    Person(s) Educated Patient    Methods Explanation    Comprehension Verbalized understanding              PT Short Term Goals - 05/27/21 1203       PT SHORT TERM GOAL #1   Title Pt will be independent with HEP in order to build upon functional gains made in therapy. ALL STGS DUE 05/27/21    Time 4    Period Weeks    Status Achieved    Target Date 05/27/21      PT SHORT TERM GOAL #2   Title Pt will improve BERG score to at least a 52/56 in order to decr fall risk    Baseline 48/56; 52/56 on 05/27/21    Time 4    Period Weeks    Status Achieved      PT SHORT TERM GOAL #3   Title Pt will decr 5x sit <> stand without UE support to 15.5 seconds or less in order to demo decr fall risk/improved functional BLE strength    Baseline 11.2 seconds on 05/27/21    Time 4    Period Weeks    Status Achieved      PT SHORT TERM GOAL #4   Title Pt will improve gait speed to at least 3.1 ft/sec with no AD vs. LRAD in order to demo improved gait efficiency.    Baseline 2.87 ft/sec; 3.47 seconds no AD on 06/06/21    Time 4    Period Weeks  Status Achieved      PT SHORT TERM GOAL #5   Title Perform DGI/FGA with LTG written.    Baseline 18/30 FGA on  05/27/21 - LTG written    Time 4    Period Weeks    Status Achieved               PT Long Term Goals - 05/27/21 1238       PT LONG TERM GOAL #1   Title Pt will be independent with final HEP in order to build upon functional gains made in therapy    Time 8    Period Weeks    Status New      PT LONG TERM GOAL #2   Title Pt will improve gait speed to at least 3.7 ft/sec with no AD  in order to demo improved gait efficiency.    Baseline 3.47 ft/sec with no AD    Time 8    Period Weeks    Status Revised      PT LONG TERM GOAL #3   Title Pt will improve FGA to at least a 22/30 in order to demo decr fall risk.    Baseline 18/30    Time 8    Period Weeks    Status Revised      PT LONG TERM GOAL #4   Title Pt will go up and down 4 steps with single handrail and step through pattern with mod I in order to safely enter/exit home.    Time 8    Period Weeks    Status New      PT LONG TERM GOAL #5   Title Pt will ambulate at least 500' on unlevel outdoor surfaces with mod I with LRAD vs. no AD in order to demo improved community mobility.    Time 8    Period Weeks    Status New                   Plan - 05/27/21 1242     Clinical Impression Statement Today's skilled session focused on assessing pt's STGs. Pt has met all of her STGs and has made excellent progress so far in PT. Performed the FGA today with pt scoring an 18/30, indicating a high risk of falls. Pt's diastolic BP value was in the 90s today during session, but still WFL to participate. Pt to see her PCP later today. Will continue to progress towards LTGs.    Personal Factors and Comorbidities Comorbidity 3+;Past/Current Experience    Comorbidities PMH: CVA, HTN, stage 3 CKD    Examination-Activity Limitations Transfers;Stairs;Locomotion Level;Carry;Lift    Examination-Participation Restrictions Community Activity;Driving   baking, crafting   Stability/Clinical Decision Making Evolving/Moderate complexity     Rehab Potential Good    PT Frequency 2x / week    PT Duration 12 weeks   do not anticipate pt needing all visits   PT Treatment/Interventions ADLs/Self Care Home Management;Aquatic Therapy;DME Instruction;Gait training;Functional mobility training;Stair training;Therapeutic activities;Therapeutic exercise;Balance training;Neuromuscular re-education;Patient/family education;Vestibular    PT Next Visit Plan monitor BP. Continue gait with no AD. RLE strengthening and balance activities - working on SLS, narrow BOS, unlevel surfaces, head motions.    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 [place], [place], [address] Phone: [phone]   Fax:  [phone]  Name: Christine Fritz MRN: [id number] Date of Birth: 04-28-1961

## 2021-06-01 ENCOUNTER — Encounter: Payer: Self-pay | Admitting: Occupational Therapy

## 2021-06-01 ENCOUNTER — Ambulatory Visit: Payer: Federal, State, Local not specified - PPO

## 2021-06-01 ENCOUNTER — Ambulatory Visit: Payer: Federal, State, Local not specified - PPO | Admitting: Occupational Therapy

## 2021-06-01 ENCOUNTER — Other Ambulatory Visit: Payer: Self-pay

## 2021-06-01 VITALS — BP 128/85 | HR 92

## 2021-06-01 DIAGNOSIS — M6281 Muscle weakness (generalized): Secondary | ICD-10-CM

## 2021-06-01 DIAGNOSIS — R2681 Unsteadiness on feet: Secondary | ICD-10-CM | POA: Diagnosis not present

## 2021-06-01 DIAGNOSIS — R278 Other lack of coordination: Secondary | ICD-10-CM

## 2021-06-01 DIAGNOSIS — I69851 Hemiplegia and hemiparesis following other cerebrovascular disease affecting right dominant side: Secondary | ICD-10-CM | POA: Diagnosis not present

## 2021-06-01 DIAGNOSIS — R2689 Other abnormalities of gait and mobility: Secondary | ICD-10-CM

## 2021-06-01 NOTE — Therapy (Signed)
[facility] [address] [place], [place], [address] Phone: [phone]   Fax:  [phone]  Physical Therapy Treatment  Patient Details  Name: Christine Fritz MRN: [id number] Date of Birth: December 01, 1960 Referring Provider (PT): Meredith Staggers, MD   Encounter Date: 06/01/2021   PT End of Session - 06/01/21 0931     Visit Number 6    Number of Visits 17   do not anticipate pt needing all visits   Date for PT Re-Evaluation 07/28/21    Authorization Type Federal [employer] - $30 copay, VL combined with pt, ot, st: 18    Authorization - Number of Visits 60    PT Start Time 0930    PT Stop Time 1012    PT Time Calculation (min) 42 min    Equipment Utilized During Treatment Gait belt    Activity Tolerance Patient tolerated treatment well    Behavior During Therapy [facility] for tasks assessed/performed             History reviewed. No pertinent past medical history.  History reviewed. No pertinent surgical history.  Vitals:   06/01/21 0934 06/01/21 0957  BP: 131/90 128/85  Pulse: 83 92     Subjective Assessment - 06/01/21 0931     Subjective PCP has put the patient on losartan. Also increased her thyroid medication. No falls. Reports knee is still bothering her some.    Pertinent History PMH: CVA, HTN, stage 3 CKD    Limitations Walking;House hold activities    Diagnostic tests CT/MRI showed a 1 cm acute ischemic nonhemorrhagic infarct involving the posterior limb of the left internal capsule.  Remote lacunar infarct of the right basal ganglia/corona radiata.    Patient Stated Goals wants to get back to normal - walking with no device, baking/decorating cakes.    Currently in Pain? No/denies              [facility] Adult PT Treatment/Exercise - 06/01/21 0001       Transfers   Transfers Sit to Stand;Stand to Sit    Sit to Stand 5: Supervision    Stand to Sit 5: Supervision      Ambulation/Gait   Ambulation/Gait Yes     Ambulation/Gait Assistance 5: Supervision    Ambulation/Gait Assistance Details Completed ambulation without AD on indoor surfaces x 400 ft. Patient continue to use RW in community at this time, trialed ambulation with SPC x 230 ft, with inital cues for sequenicng and demonstration provided, then trial SPC with quad tip x 115. Slowed gait speed noted with use of SPC vs without AD. As well as no significant change in balance. Plan to assess gait outdoors at next session.    Ambulation Distance (Feet) 400 Feet   115 x 1, 230 x 1.   Assistive device None    Gait Pattern Step-through pattern;Decreased stance time - right;Decreased step length - right;Decreased arm swing - right;Decreased dorsiflexion - right    Ambulation Surface Level;Indoor                 Balance Exercises - 06/01/21 0001       Balance Exercises: Standing   Tandem Stance Eyes open;Eyes closed;3 reps;30 secs;Limitations    Tandem Stance Time partial tandem alternating LE posterior, completed EO x 30 seconds, then EC 2 x 30 secs.    SLS with Vectors Foam/compliant surface;Intermittent upper extremity assist;Limitations    SLS with Vectors Limitations standing on rockerboard (A/P) completed alternating  toe taps to cones x 10 reps with BUE support, then x 5 reps with single UE support, and then x 2 reps with no UE support. CGA without UE support and cues to stand tall through LE with SLS    Stepping Strategy Anterior;Posterior;Foam/compliant surface;Limitations    Stepping Strategy Limitations Alternating standing on blue foam beam, x10 reps each direction, cued for foot clearance    Rockerboard Anterior/posterior;EO;Head turns;Intermittent UE support;Limitations    Rockerboard Limitations on rockerboard A/P: completed horiz/vertical head turns x 10 reps each direction, CGA intermittent.    Balance Beam standing across blue balance beam with EO x 30 seconds, then EC 2 x 30 seconds. Then with EO completed horiz/vertical head  turns x 10 reps each. CGA.    Sidestepping Foam/compliant support;3 reps;Limitations    Sidestepping Limitations across blue balance beam without UE support, completed x 3 laps.                  PT Short Term Goals - 05/27/21 1203       PT SHORT TERM GOAL #1   Title Pt will be independent with HEP in order to build upon functional gains made in therapy. ALL STGS DUE 05/27/21    Time 4    Period Weeks    Status Achieved    Target Date 05/27/21      PT SHORT TERM GOAL #2   Title Pt will improve BERG score to at least a 52/56 in order to decr fall risk    Baseline 48/56; 52/56 on 05/27/21    Time 4    Period Weeks    Status Achieved      PT SHORT TERM GOAL #3   Title Pt will decr 5x sit <> stand without UE support to 15.5 seconds or less in order to demo decr fall risk/improved functional BLE strength    Baseline 11.2 seconds on 05/27/21    Time 4    Period Weeks    Status Achieved      PT SHORT TERM GOAL #4   Title Pt will improve gait speed to at least 3.1 ft/sec with no AD vs. LRAD in order to demo improved gait efficiency.    Baseline 2.87 ft/sec; 3.47 seconds no AD on 06/06/21    Time 4    Period Weeks    Status Achieved      PT SHORT TERM GOAL #5   Title Perform DGI/FGA with LTG written.    Baseline 18/30 FGA on 05/27/21 - LTG written    Time 4    Period Weeks    Status Achieved               PT Long Term Goals - 05/27/21 1238       PT LONG TERM GOAL #1   Title Pt will be independent with final HEP in order to build upon functional gains made in therapy    Time 8    Period Weeks    Status New      PT LONG TERM GOAL #2   Title Pt will improve gait speed to at least 3.7 ft/sec with no AD  in order to demo improved gait efficiency.    Baseline 3.47 ft/sec with no AD    Time 8    Period Weeks    Status Revised      PT LONG TERM GOAL #3   Title Pt will improve FGA to at least a 22/30 in order to demo  decr fall risk.    Baseline 18/30    Time 8     Period Weeks    Status Revised      PT LONG TERM GOAL #4   Title Pt will go up and down 4 steps with single handrail and step through pattern with mod I in order to safely enter/exit home.    Time 8    Period Weeks    Status New      PT LONG TERM GOAL #5   Title Pt will ambulate at least 500' on unlevel outdoor surfaces with mod I with LRAD vs. no AD in order to demo improved community mobility.    Time 8    Period Weeks    Status New                   Plan - 06/01/21 1016     Clinical Impression Statement Continued gait indoors without AD with patient demonstrating good gait speed and balance, to transition patient off RW trialed SPC with and without quad tip to determine most appropriate AD for community mobility. SPC slowed patient's gait speed to sequencing, will continue to practice at future session as well as outdoors. BP WNL limits to partipate in PT services.    Personal Factors and Comorbidities Comorbidity 3+;Past/Current Experience    Comorbidities PMH: CVA, HTN, stage 3 CKD    Examination-Activity Limitations Transfers;Stairs;Locomotion Level;Carry;Lift    Examination-Participation Restrictions Community Activity;Driving   baking, crafting   Stability/Clinical Decision Making Evolving/Moderate complexity    Rehab Potential Good    PT Frequency 2x / week    PT Duration 12 weeks   do not anticipate pt needing all visits   PT Treatment/Interventions ADLs/Self Care Home Management;Aquatic Therapy;DME Instruction;Gait training;Functional mobility training;Stair training;Therapeutic activities;Therapeutic exercise;Balance training;Neuromuscular re-education;Patient/family education;Vestibular    PT Next Visit Plan monitor BP. Continue gait with no AD vs continue to trial SPC (complete outdoors), if paitent can get comfortable potential transition to this vs NO Ad in community. Work on bending down/reaching. RLE strengthening and balance activities - working on SLS, narrow  BOS, unlevel surfaces, head motions.    PT Home Exercise Plan [id number]    Consulted and Agree with Plan of Care Patient             Patient will benefit from skilled therapeutic intervention in order to improve the following deficits and impairments:  Abnormal gait, Decreased balance, Decreased coordination, Decreased strength, Difficulty walking, Postural dysfunction, Obesity  Visit Diagnosis: Other abnormalities of gait and mobility  Unsteadiness on feet  Muscle weakness (generalized)  Hemiplegia and hemiparesis following other cerebrovascular disease affecting right dominant side [facility])     Problem List Patient Active Problem List   Diagnosis Date Noted   Slow transit constipation    Stage 3a chronic kidney disease ([place])    Essential hypertension    Small vessel disease, cerebrovascular 04/08/2021   Acute CVA (cerebrovascular accident) ([place]) 04/05/2021    Jones Bales, PT, DPT 06/01/2021, 10:53 AM  [facility] [address] [place], [place], [address] Phone: [phone]   Fax:  [phone]  Name: Christine Fritz MRN: [id number] Date of Birth: [birth year]

## 2021-06-01 NOTE — Therapy (Signed)
[facility] [address] [place], [place], [address] Phone: [phone]   Fax:  [phone]  Occupational Therapy Treatment & Recertification  Patient Details  Name: Christine Fritz MRN: [id number] Date of Birth: 01/10/1961 Referring Provider (OT): Alger Simons, MD   Encounter Date: 06/01/2021   OT End of Session - 06/01/21 0852     Visit Number 6    Number of Visits 11   6+5   Date for OT Re-Evaluation 07/13/21   5 visits over 6 weeks at renewal   Authorization Type BCBS    Authorization Time Period $30 copay VL:50 combined No Auth    Authorization - Number of Visits 25    OT Start Time 0848    OT Stop Time 0930    OT Time Calculation (min) 42 min    Activity Tolerance Patient tolerated treatment well    Behavior During Therapy [facility] for tasks assessed/performed             History reviewed. No pertinent past medical history.  History reviewed. No pertinent surgical history.  There were no vitals filed for this visit.   Subjective Assessment - 06/01/21 0852     Subjective  Pt reports nothing new and denies any pain.    Pertinent History PMH: none    Limitations Fall Risk    Patient Stated Goals "get back to baking and crafting"    Currently in Pain? No/denies    Pain Score 0-No pain                Reviewed goals and discussed any updated or additional goals for recertification  Resistance Clothespins 1-8# with RUE with good sustained pinch and reach with RUE  Small Pegboard with RUE with in hand manipulation to place pegs into board and copy pattern with no difficulty copying pattern and min difficulty and 5-6 drops with coordination.                    OT Short Term Goals - 05/25/21 1021       OT SHORT TERM GOAL #1   Title Pt will be independent with HEP for coordination    Time 4    Period Weeks    Status Achieved    Target Date 05/26/21      OT SHORT TERM GOAL #2    Title Pt will bake cupcakes with mod I and good safety awareness    Time 4    Period Weeks    Status Achieved      OT SHORT TERM GOAL #3   Title Pt will verbalize understanding of adapated strategies and/or equipment for increasing independence and ease with completeing ADLs and IADLs (baking, crafts, etc)    Time 4    Period Weeks    Status Achieved               OT Long Term Goals - 06/01/21 0855       OT LONG TERM GOAL #1   Title Pt will be independent with grip strength HEP.    Time 8    Period Weeks    Status Achieved    Target Date 06/23/21      OT LONG TERM GOAL #2   Title Pt will increase grip strength by 5 lbs or greater for increasing strength in dominant hand.    Baseline R 65, L 68.1    Time 8    Period Weeks  Status On-going   63.4 lbs RUE     OT LONG TERM GOAL #3   Title Pt will improve legibility and fluidity of handwriting with RUE to 100% of prior level    Baseline 75%    Time 8    Period Weeks    Status On-going   pt reports about 80% of prior level     OT LONG TERM GOAL #4   Title Pt will increase coordination in RUE evidenced by completing 9 hole peg test in 25 seconds or less.    Baseline R 31.13s, L 24.06s    Time 8    Period Weeks    Status On-going   23.44s 06/01/21 RUE - inconsistent     OT LONG TERM GOAL #5   Title Pt will report baking and decorating cupcakes and/or cake and/or completing crafting task (floral arrangements) with mod I    Time 8    Period Weeks    Status On-going   worked on tying tiny bows and gluing over the weekend but with safety concerns1/10/23     OT LONG TERM GOAL #6   Title Pt will be able to use BUE to peel a potato with increased ease and coordination    Time 6    Period Weeks    Status New    Target Date 07/13/21      OT LONG TERM GOAL #7   Title Pt will report increased ease and decreased fatigue with stirring batter for cupcakes.    Time 6    Period Weeks    Status New                    Plan - 06/01/21 0859     Clinical Impression Statement Recertification today to account for additional visits in plan of care for increasing strength and coordination in RUE for increasing ease with ADLs and IADLs, handwriting, cooking, crafts and baking. Skilled occupational therapy is recommended to continue to work on these skills. Pt has met all STGs and is progressing towards LTGs. Additional goals to continue to work on increasing independence with prior level of skills.    OT Occupational Profile and History Problem Focused Assessment - Including review of records relating to presenting problem    Occupational performance deficits (Please refer to evaluation for details): IADL's;ADL's;Leisure    Body Structure / Function / Physical Skills ADL;FMC;Decreased knowledge of use of DME;Coordination;IADL;ROM;UE functional use;GMC;Strength;Dexterity    Rehab Potential Good    Clinical Decision Making Limited treatment options, no task modification necessary    Comorbidities Affecting Occupational Performance: None    Modification or Assistance to Complete Evaluation  No modification of tasks or assist necessary to complete eval    OT Frequency 1x / week    OT Duration 6 weeks   5 visits over 6 weeks d/t any scheduling conflicts at renewal   OT Treatment/Interventions Self-care/ADL training;DME and/or AE instruction;Functional Mobility Training;Manual Therapy;Patient/family education;Passive range of motion;Neuromuscular education;Therapeutic exercise;Therapeutic activities    Plan recertification completed, continue towards unmet goals, in hand manipulation, grip strength and sustained grasp    Consulted and Agree with Plan of Care Patient             Patient will benefit from skilled therapeutic intervention in order to improve the following deficits and impairments:   Body Structure / Function / Physical Skills: ADL, FMC, Decreased knowledge of use of DME, Coordination, IADL,  ROM, UE functional use, GMC, Strength, Dexterity  Visit Diagnosis: Other abnormalities of gait and mobility  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)  Muscle weakness (generalized)  Other lack of coordination  Unsteadiness on feet    Problem List Patient Active Problem List   Diagnosis Date Noted   Slow transit constipation    Stage 3a chronic kidney disease ([place])    Essential hypertension    Small vessel disease, cerebrovascular 04/08/2021   Acute CVA (cerebrovascular accident) [facility]) 04/05/2021    Zachery Conch, OT 06/01/2021, 9:30 AM  [facility] [address] [place], [place], [address] Phone: [phone]   Fax:  [phone]  Name: JOHNELL LANDOWSKI MRN: [id number] Date of Birth: [birth year]

## 2021-06-03 ENCOUNTER — Ambulatory Visit: Payer: Federal, State, Local not specified - PPO | Admitting: Physical Therapy

## 2021-06-03 ENCOUNTER — Other Ambulatory Visit: Payer: Self-pay

## 2021-06-03 ENCOUNTER — Encounter: Payer: Self-pay | Admitting: Physical Therapy

## 2021-06-03 VITALS — BP 126/95 | HR 109

## 2021-06-03 DIAGNOSIS — R2689 Other abnormalities of gait and mobility: Secondary | ICD-10-CM | POA: Diagnosis not present

## 2021-06-03 DIAGNOSIS — M6281 Muscle weakness (generalized): Secondary | ICD-10-CM

## 2021-06-03 DIAGNOSIS — R278 Other lack of coordination: Secondary | ICD-10-CM | POA: Diagnosis not present

## 2021-06-03 DIAGNOSIS — R2681 Unsteadiness on feet: Secondary | ICD-10-CM | POA: Diagnosis not present

## 2021-06-03 DIAGNOSIS — I69851 Hemiplegia and hemiparesis following other cerebrovascular disease affecting right dominant side: Secondary | ICD-10-CM | POA: Diagnosis not present

## 2021-06-03 NOTE — Therapy (Signed)
[facility] [address] [place], [place], [address] Phone: [phone]   Fax:  [phone]  Physical Therapy Treatment  Patient Details  Name: Christine Fritz MRN: [id number] Date of Birth: 07-04-1960 Referring Provider (PT): Meredith Staggers, MD   Encounter Date: 06/03/2021   PT End of Session - 06/03/21 0843     Visit Number 7    Number of Visits 17   do not anticipate pt needing all visits   Date for PT Re-Evaluation 07/28/21    Authorization Type Federal [employer] - $30 copay, VL combined with pt, ot, st: 13    Authorization - Number of Visits 60    PT Start Time 0841    PT Stop Time 0922    PT Time Calculation (min) 41 min    Equipment Utilized During Treatment Gait belt    Activity Tolerance Patient tolerated treatment well    Behavior During Therapy [facility] for tasks assessed/performed             History reviewed. No pertinent past medical history.  History reviewed. No pertinent surgical history.  Vitals:   06/03/21 0845 06/03/21 0911  BP: (!) 133/92 (!) 126/95  Pulse: 92 (!) 109     Subjective Assessment - 06/03/21 0843     Subjective Went shopping yesterday without her AD and it went well, just felt fatigued. No falls.    Pertinent History PMH: CVA, HTN, stage 3 CKD    Limitations Walking;House hold activities    Diagnostic tests CT/MRI showed a 1 cm acute ischemic nonhemorrhagic infarct involving the posterior limb of the left internal capsule.  Remote lacunar infarct of the right basal ganglia/corona radiata.    Patient Stated Goals wants to get back to normal - walking with no device, baking/decorating cakes.    Currently in Pain? No/denies                               [facility] Adult PT Treatment/Exercise - 06/03/21 0854       Ambulation/Gait   Ambulation/Gait Yes    Ambulation/Gait Assistance 5: Supervision    Ambulation/Gait Assistance Details Indoors x1 lap with cane in  LUE with cues for proper sequencing and then outdoor gait with cane with continued cues for technique and looking ahead. Pt reporting more difficulties with cane due to coordination. Tried gait with no AD outdoors with pt having no issues over paved surfaces. Discussed pt can walk shorter distances outdoors with no AD, but might want to have the RW for longer distances when more fatigued if the cane is too much to coordinate    Ambulation Distance (Feet) 115 Feet   x1, 500 x1   Assistive device None;Straight cane    Gait Pattern Step-through pattern;Decreased stance time - right;Decreased step length - right;Decreased arm swing - right;Decreased dorsiflexion - right    Ambulation Surface Indoor;Level;Unlevel;Outdoor;Paved    Gait Comments Plus an additional lap indoors with no AD and scanning environment/ head motions. Pt with no LOB when performing      Exercises   Exercises Knee/Hip      Knee/Hip Exercises: Standing   Lateral Step Up Both;1 set;10 reps;Step Height: 6";Hand Hold: 0;Limitations    Lateral Step Up Limitations Keeping stance leg on step and then tapping other foot to step    Forward Step Up 1 set;Both;10 reps;Hand Hold: 1;Step Height: 6"    Forward Step Up Limitations  Trying to float non stance leg with cues for glute activation and pt needing intermittent UE support, noted Trendelenberg when performing      Knee/Hip Exercises: Sidelying   Hip ABduction Strengthening;Both;2 sets;10 reps    Hip ABduction Limitations verbal/tactile cues for technique and proper alignment                 Balance Exercises - 06/03/21 0854       Balance Exercises: Standing   Standing Eyes Opened Narrow base of support (BOS);Limitations    Standing Eyes Opened Limitations On air ex: 2 x 30 seconds, feet hip width > narrow BOS 2 x 10 reps head turns, 2 x 10 reps head nods    Sit to Stand Foam/compliant surface;Limitations    Sit to Stand Limitations x10 reps with no UE support, focus on  eccentric control                  PT Short Term Goals - 05/27/21 1203       PT SHORT TERM GOAL #1   Title Pt will be independent with HEP in order to build upon functional gains made in therapy. ALL STGS DUE 05/27/21    Time 4    Period Weeks    Status Achieved    Target Date 05/27/21      PT SHORT TERM GOAL #2   Title Pt will improve BERG score to at least a 52/56 in order to decr fall risk    Baseline 48/56; 52/56 on 05/27/21    Time 4    Period Weeks    Status Achieved      PT SHORT TERM GOAL #3   Title Pt will decr 5x sit <> stand without UE support to 15.5 seconds or less in order to demo decr fall risk/improved functional BLE strength    Baseline 11.2 seconds on 05/27/21    Time 4    Period Weeks    Status Achieved      PT SHORT TERM GOAL #4   Title Pt will improve gait speed to at least 3.1 ft/sec with no AD vs. LRAD in order to demo improved gait efficiency.    Baseline 2.87 ft/sec; 3.47 seconds no AD on 06/06/21    Time 4    Period Weeks    Status Achieved      PT SHORT TERM GOAL #5   Title Perform DGI/FGA with LTG written.    Baseline 18/30 FGA on 05/27/21 - LTG written    Time 4    Period Weeks    Status Achieved               PT Long Term Goals - 05/27/21 1238       PT LONG TERM GOAL #1   Title Pt will be independent with final HEP in order to build upon functional gains made in therapy    Time 8    Period Weeks    Status New      PT LONG TERM GOAL #2   Title Pt will improve gait speed to at least 3.7 ft/sec with no AD  in order to demo improved gait efficiency.    Baseline 3.47 ft/sec with no AD    Time 8    Period Weeks    Status Revised      PT LONG TERM GOAL #3   Title Pt will improve FGA to at least a 22/30 in order to demo decr fall risk.  Baseline 18/30    Time 8    Period Weeks    Status Revised      PT LONG TERM GOAL #4   Title Pt will go up and down 4 steps with single handrail and step through pattern with mod I in  order to safely enter/exit home.    Time 8    Period Weeks    Status New      PT LONG TERM GOAL #5   Title Pt will ambulate at least 500' on unlevel outdoor surfaces with mod I with LRAD vs. no AD in order to demo improved community mobility.    Time 8    Period Weeks    Status New                   Plan - 06/03/21 [id number]     Clinical Impression Statement Pt's diastolic BP still remaining elevated, but WFL to participate in therapy today. Worked on gait with cane today with pt having difficulties maintaining coordination/technique. Performed gait with no AD outdoors on paved surfaces with pt having no issues. Discussed can use no AD for shorter distances, but having RW for longer distances where pt might get more fatigued. Noted hip ABD weakness by Trendelenberg on stance leg when performing forward step ups. Pt fatigued by RLE strengthening, will continue to progress towards LTGs.    Personal Factors and Comorbidities Comorbidity 3+;Past/Current Experience    Comorbidities PMH: CVA, HTN, stage 3 CKD    Examination-Activity Limitations Transfers;Stairs;Locomotion Level;Carry;Lift    Examination-Participation Restrictions Community Activity;Driving   baking, crafting   Stability/Clinical Decision Making Evolving/Moderate complexity    Rehab Potential Good    PT Frequency 2x / week    PT Duration 12 weeks   do not anticipate pt needing all visits   PT Treatment/Interventions ADLs/Self Care Home Management;Aquatic Therapy;DME Instruction;Gait training;Functional mobility training;Stair training;Therapeutic activities;Therapeutic exercise;Balance training;Neuromuscular re-education;Patient/family education;Vestibular    PT Next Visit Plan monitor BP. continue gait with no AD and practice curbs/grass. Hip ABD strengthening. Work on bending down/reaching. RLE strengthening and balance activities - working on SLS, narrow BOS, unlevel surfaces, head motions.    PT Home Exercise Plan [email or web address]     Consulted and Agree with Plan of Care Patient             Patient will benefit from skilled therapeutic intervention in order to improve the following deficits and impairments:  Abnormal gait, Decreased balance, Decreased coordination, Decreased strength, Difficulty walking, Postural dysfunction, Obesity  Visit Diagnosis: Other abnormalities of gait and mobility  Muscle weakness (generalized)  Other lack of coordination  Unsteadiness on feet     Problem List Patient Active Problem List   Diagnosis Date Noted   Slow transit constipation    Stage 3a chronic kidney disease ([place])    Essential hypertension    Small vessel disease, cerebrovascular 04/08/2021   Acute CVA (cerebrovascular accident) ([place]) 04/05/2021    Arliss Journey, PT, DPT  06/03/2021, 9:30 AM  [facility] [address] [place], [place], [address] Phone: [phone]   Fax:  [phone]  Name: AUDRENA HEYDON MRN: [id number] Date of Birth: 1960-12-15

## 2021-06-07 ENCOUNTER — Ambulatory Visit: Payer: Federal, State, Local not specified - PPO | Admitting: Neurology

## 2021-06-07 ENCOUNTER — Encounter: Payer: Self-pay | Admitting: Neurology

## 2021-06-07 VITALS — BP 154/85 | HR 75 | Ht 64.0 in | Wt 239.4 lb

## 2021-06-07 DIAGNOSIS — R519 Headache, unspecified: Secondary | ICD-10-CM

## 2021-06-07 DIAGNOSIS — I639 Cerebral infarction, unspecified: Secondary | ICD-10-CM | POA: Diagnosis not present

## 2021-06-07 DIAGNOSIS — R351 Nocturia: Secondary | ICD-10-CM

## 2021-06-07 DIAGNOSIS — G478 Other sleep disorders: Secondary | ICD-10-CM | POA: Diagnosis not present

## 2021-06-07 DIAGNOSIS — R0683 Snoring: Secondary | ICD-10-CM

## 2021-06-07 NOTE — Progress Notes (Signed)
Star Age, MD, PhD [facility] [address], [address]O. [place], [place] [address]  Dear Christine Fritz and Christine Fritz,   I saw your patient, Christine Fritz, upon your kind request, in my sleep clinic today for initial consultation of her sleep disorder, in particular, concern for underlying obstructive sleep apnea.  The patient unaccompanied today.  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  I reviewed the office note from 05/19/2021.  Her Epworth sleepiness score is 3 out of 24, fatigue severity score is 48 out of 63.  She has some difficulty maintaining sleep.  Bedtime is generally between 10 and 11 PM and rise time around 7:30 AM.  She has nocturia about 3-4 times per average night and has occasional morning headaches.  She does not typically take any medication for her headaches.  She has a history of restless leg symptoms and has been on ropinirole as needed, currently not on a nightly basis.  She has a family history of sleep apnea affecting her mom and her sister, both have machines.  Her snoring is loud per her husband.  She does not have a TV in her bedroom and does not have any pets in the household.  She has 3 biological children, all grown.  She does not currently work.  She is a non-smoker and does not currently drink any alcohol.  She drinks caffeine in the form of soda, about 1 bottle per day, occasional tea, typically no coffee.  Her Past Medical History Is Significant For: Past Medical History:  Diagnosis Date   Hypertension    Hypothyroidism    Restless leg    Stroke [facility])     Her Past Surgical History Is Significant For: History reviewed. No pertinent surgical history.  Her Family History Is Significant  For: Family History  Problem Relation Age of Onset   Hypertension Father    Rheum arthritis Sister    Rheum arthritis Brother    Stroke Neg Hx     Her Social History Is Significant For: Social History   Socioeconomic History   Marital status: Married    Spouse name: Not on file   Number of children: Not on file   Years of education: Not on file   Highest education level: Not on file  Occupational History   Not on file  Tobacco Use   Smoking status: Never   Smokeless tobacco: Never  Vaping Use   Vaping Use: Never used  Substance and Sexual Activity   Alcohol use: Not Currently   Drug use: Not Currently   Sexual activity: Not Currently  Other Topics Concern   Not on file  Social History Narrative   Caffeine:  Tea/ Diet coke 1 16oz daily.  Education: [employer].  Not working. Married 5 kids total.  (Blended family- 2 sets of twins).    Social Determinants of Health   Financial Resource Strain: Not on file  Food Insecurity: Not on file  Transportation Needs: Not on file  Physical Activity: Not on file  Stress: Not on file  Social Connections: Not on file    Her Allergies Are:  No Known Allergies:   Her Current Medications Are:  Outpatient Encounter Medications as of 06/07/2021  Medication Sig   acetaminophen (TYLENOL)  325 MG tablet Take 2 tablets (650 mg total) by mouth every 4 (four) hours as needed for mild pain (or temp > 37.5 C (99.5 F)).   amLODipine (NORVASC) 10 MG tablet Take 1 tablet (10 mg total) by mouth daily.   aspirin EC 81 MG EC tablet Take 1 tablet (81 mg total) by mouth daily. Swallow whole.   atorvastatin (LIPITOR) 80 MG tablet Take 1 tablet (80 mg total) by mouth daily.   levothyroxine (SYNTHROID) 125 MCG tablet 1 tablet in the morning on an empty stomach   magnesium oxide (MAG-OX) 400 MG tablet Take 1 tablet (400 mg total) by mouth daily.   rOPINIRole (REQUIP) 1 MG tablet Take 1 tablet (1 mg total) by mouth at bedtime.   [DISCONTINUED]  levothyroxine (SYNTHROID) 100 MCG tablet Take 1 tablet (100 mcg total) by mouth daily at 6 (six) AM. (Patient not taking: Reported on 06/07/2021)   No facility-administered encounter medications on file as of 06/07/2021.  :   Review of Systems:  Out of a complete 14 point review of systems, all are reviewed and negative with the exception of these symptoms as listed below:  Snoring, daytime sleepiness, no witnessed apnea. ESS: 3  FSS:  40.  Physical Examination:   Vitals:   06/07/21 1245  BP: (!) 154/85  Pulse: 75    General Examination: The patient is a very pleasant 61 y.o. female in no acute distress. She appears well-developed and well-nourished and well groomed.   HEENT: Normocephalic, atraumatic, pupils are equal, round and reactive to light, extraocular tracking is good without limitation to gaze excursion or nystagmus noted. Hearing is grossly intact. Face is symmetric with normal facial animation. Speech is clear with no dysarthria noted. There is no hypophonia. There is no lip, neck/head, jaw or voice tremor. Neck is supple with full range of passive and active motion. There are no carotid bruits on auscultation. Oropharynx exam reveals: mild mouth dryness, adequate dental hygiene and moderate airway crowding, due to prominent uvula.  Tonsils absent.  Mallampati class III.  Neck circumference of 17-7/8 inches.  Tongue protrudes centrally.   Chest: Clear to auscultation without wheezing, rhonchi or crackles noted.  Heart: S1+S2+0, regular and normal without murmurs, rubs or gallops noted.   Abdomen: Soft, non-tender and non-distended with normal bowel sounds appreciated on auscultation.  Extremities: There is 2-3+ pitting edema in the distal lower extremities bilaterally more on the right than left.   Skin: Warm and dry without trophic changes noted.   Musculoskeletal: exam reveals no obvious joint deformities, tenderness or joint swelling or erythema.   Neurologically:   Mental status: The patient is awake, alert and oriented in all 4 spheres. Her immediate and remote memory, attention, language skills and fund of knowledge are appropriate. There is no evidence of aphasia, agnosia, apraxia or anomia. Speech is clear with normal prosody and enunciation. Thought process is linear. Mood is normal and affect is normal.  Cranial nerves II - XII are as described above under HEENT exam.  Motor exam: Normal bulk, strength and tone is noted. There is no tremor. Fine motor skills and coordination: grossly intact.  Cerebellar testing: No dysmetria or intention tremor. There is no truncal or gait ataxia.  Sensory exam: intact to light touch in the upper and lower extremities.  Gait, station and balance: She stands easily. No veering to one side is noted. No leaning to one side is noted. Posture is age-appropriate and stance is narrow based. Gait shows normal  stride length and normal pace. No problems turning are noted.    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 I had a long chat with the patient about my findings and the diagnosis of OSA, its prognosis and treatment options. We talked about medical treatments, surgical interventions and non-pharmacological approaches. I explained in particular the risks and ramifications of untreated moderate to severe OSA, especially with respect to developing cardiovascular disease down the Road, including congestive heart failure, difficult to treat hypertension, cardiac arrhythmias, or stroke. Even type 2 diabetes has, in part, been linked to untreated OSA. Symptoms of untreated OSA include daytime sleepiness, memory problems, mood  irritability and mood disorder such as depression and anxiety, lack of energy, as well as recurrent headaches, especially morning headaches. We talked about trying to maintain a healthy lifestyle in general, as well as the importance of weight control. We also talked about the importance of good sleep hygiene. I recommended the following at this time: sleep study.  I outlined the differences between a laboratory attended sleep study versus home sleep test.  I explained the sleep test procedure to the patient and also outlined possible surgical and non-surgical treatment options of OSA, including the use of a custom-made dental device (which would require a referral to a specialist dentist or oral surgeon), upper airway surgical options, such as traditional UPPP or a novel less invasive surgical option in the form of Inspire hypoglossal nerve stimulation (which would involve a referral to an ENT surgeon). I also explained the CPAP treatment option to the patient, who indicated that she would be willing to try CPAP if the need arises. I explained the importance of being compliant with PAP treatment, not only for insurance purposes but primarily to improve Her symptoms, and for the patient's long term health benefit, including to reduce Her cardiovascular risks. I answered all her questions today and the patient was in agreement. I plan to see her back after the sleep study is completed and encouraged her to call with any interim questions, concerns, problems or updates.   Thank you very much for allowing me to participate in the care of this nice patient. If I can be of any further assistance to you please do not hesitate to talk to me.    Sincerely,   Star Age, MD, PhD

## 2021-06-07 NOTE — Patient Instructions (Signed)

## 2021-06-08 ENCOUNTER — Ambulatory Visit: Payer: Federal, State, Local not specified - PPO | Admitting: Occupational Therapy

## 2021-06-08 ENCOUNTER — Ambulatory Visit: Payer: Federal, State, Local not specified - PPO | Admitting: Physical Therapy

## 2021-06-08 ENCOUNTER — Encounter: Payer: Self-pay | Admitting: Physical Therapy

## 2021-06-08 ENCOUNTER — Other Ambulatory Visit: Payer: Self-pay

## 2021-06-08 ENCOUNTER — Encounter: Payer: Self-pay | Admitting: Occupational Therapy

## 2021-06-08 VITALS — BP 146/88 | HR 87

## 2021-06-08 DIAGNOSIS — R2689 Other abnormalities of gait and mobility: Secondary | ICD-10-CM | POA: Diagnosis not present

## 2021-06-08 DIAGNOSIS — M6281 Muscle weakness (generalized): Secondary | ICD-10-CM

## 2021-06-08 DIAGNOSIS — I69851 Hemiplegia and hemiparesis following other cerebrovascular disease affecting right dominant side: Secondary | ICD-10-CM

## 2021-06-08 DIAGNOSIS — R278 Other lack of coordination: Secondary | ICD-10-CM | POA: Diagnosis not present

## 2021-06-08 DIAGNOSIS — R2681 Unsteadiness on feet: Secondary | ICD-10-CM | POA: Diagnosis not present

## 2021-06-08 NOTE — Patient Instructions (Signed)
Access Code: [email or web address] URL: [email or web address] Date: 06/08/2021 Prepared by: Sherlie Ban  Exercises Staggered Sit-to-Stand - 2 x daily - 5 x weekly - 1-2 sets - 10 reps Standing Marching - 1-2 x daily - 5 x weekly - 2 sets - 10 reps Standing Single Leg Stance with Counter Support - 2 x daily - 5 x weekly - 3 sets - 10 hold Standing Tandem Balance with Counter Support - 2 x daily - 5 x weekly - 3 sets - 15 hold Romberg Stance with Eyes Closed - 2 x daily - 5 x weekly - 2 sets - 5 reps Forward Step Up - 1 x daily - 5 x weekly - 2 sets - 10 reps Lateral Step Ups - 1 x daily - 5 x weekly - 2 sets - 10 reps Side Stepping with Resistance at Feet - 2 x daily - 5 x weekly - 3 sets

## 2021-06-08 NOTE — Therapy (Signed)
[facility] [address] [place], [place], [address] Phone: [phone]   Fax:  [phone]  Occupational Therapy Treatment  Patient Details  Name: Christine Fritz MRN: [id number] Date of Birth: 12/14/1960 Referring Provider (OT): Faith RogueZachary Swartz, MD   Encounter Date: 06/08/2021   OT End of Session - 06/08/21 0931     Visit Number 7    Number of Visits 11   6+5   Date for OT Re-Evaluation 07/13/21   5 visits over 6 weeks at renewal   Authorization Type BCBS    Authorization Time Period $30 copay VL:50 combined No Auth    Authorization - Number of Visits 25    OT Start Time [phone] [id number]    OT Stop Time 1010    OT Time Calculation (min) 39 min    Activity Tolerance Patient tolerated treatment well    Behavior During Therapy [facility] for tasks assessed/performed             Past Medical History:  Diagnosis Date   Hypertension    Hypothyroidism    Restless leg    Stroke [facility](H[facility])     History reviewed. No pertinent surgical history.  There were no vitals filed for this visit.   Subjective Assessment - 06/08/21 0848     Subjective  Pt denie any changes, denies pain.    Pertinent History PMH: none    Limitations Fall Risk    Patient Stated Goals "get back to baking and crafting"    Currently in Pain? No/denies    Pain Score 0-No pain                          OT Treatments/Exercises (OP) - 06/08/21 [id number]       Functional Reaching Activities   High Level reaching overhead to braid 3 strings working on coordination and bimanual cooridnation with arms overhead and away from body.      Fine Motor Coordination (Hand/Wrist)   Fine Motor Coordination In hand manipuation training;Handwriting    In Hand Manipulation Training with small peg board with RUE and placing pegs with bringing one at a time from palm to fingertips. with min difficulty and min drops. removed with same technique. Again working in hand  manipulation with placing small beads onto golf tees with RUE and with min drops and min difficulty. Removed in same manner    Handwriting wrte name and address with 100% legibiity and fluidity - pt reports going to write addresses for daughter's wedding invitations however concerned about fatigue.                      OT Short Term Goals - 05/25/21 1021       OT SHORT TERM GOAL #1   Title Pt will be independent with HEP for coordination    Time 4    Period Weeks    Status Achieved    Target Date 05/26/21      OT SHORT TERM GOAL #2   Title Pt will bake cupcakes with mod I and good safety awareness    Time 4    Period Weeks    Status Achieved      OT SHORT TERM GOAL #3   Title Pt will verbalize understanding of adapated strategies and/or equipment for increasing independence and ease with completeing ADLs and IADLs (baking, crafts, etc)    Time 4    Period Weeks  Status Achieved               OT Long Term Goals - 06/01/21 0855       OT LONG TERM GOAL #1   Title Pt will be independent with grip strength HEP.    Time 8    Period Weeks    Status Achieved    Target Date 06/23/21      OT LONG TERM GOAL #2   Title Pt will increase grip strength by 5 lbs or greater for increasing strength in dominant hand.    Baseline R 65, L 68.1    Time 8    Period Weeks    Status On-going   63.4 lbs RUE     OT LONG TERM GOAL #3   Title Pt will improve legibility and fluidity of handwriting with RUE to 100% of prior level    Baseline 75%    Time 8    Period Weeks    Status On-going   pt reports about 80% of prior level     OT LONG TERM GOAL #4   Title Pt will increase coordination in RUE evidenced by completing 9 hole peg test in 25 seconds or less.    Baseline R 31.13s, L 24.06s    Time 8    Period Weeks    Status On-going   23.44s 06/01/21 RUE - inconsistent     OT LONG TERM GOAL #5   Title Pt will report baking and decorating cupcakes and/or cake and/or  completing crafting task (floral arrangements) with mod I    Time 8    Period Weeks    Status On-going   worked on tying tiny bows and gluing over the weekend but with safety concerns1/10/23     OT LONG TERM GOAL #6   Title Pt will be able to use BUE to peel a potato with increased ease and coordination    Time 6    Period Weeks    Status New    Target Date 07/13/21      OT LONG TERM GOAL #7   Title Pt will report increased ease and decreased fatigue with stirring batter for cupcakes.    Time 6    Period Weeks    Status New                   Plan - 06/08/21 1121     Clinical Impression Statement pt continues to demonstrate increased coordination and grip strength. continue to progress towards goals.    OT Occupational Profile and History Problem Focused Assessment - Including review of records relating to presenting problem    Occupational performance deficits (Please refer to evaluation for details): IADL's;ADL's;Leisure    Body Structure / Function / Physical Skills ADL;FMC;Decreased knowledge of use of DME;Coordination;IADL;ROM;UE functional use;GMC;Strength;Dexterity    Rehab Potential Good    Clinical Decision Making Limited treatment options, no task modification necessary    Comorbidities Affecting Occupational Performance: None    Modification or Assistance to Complete Evaluation  No modification of tasks or assist necessary to complete eval    OT Frequency 1x / week    OT Duration 6 weeks   5 visits over 6 weeks d/t any scheduling conflicts at renewal   OT Treatment/Interventions Self-care/ADL training;DME and/or AE instruction;Functional Mobility Training;Manual Therapy;Patient/family education;Passive range of motion;Neuromuscular education;Therapeutic exercise;Therapeutic activities    Plan bake cupcakes or brownies, in hand manipulation, sustained grasp, grip strength    Consulted and Agree  with Plan of Care Patient             Patient will benefit from  skilled therapeutic intervention in order to improve the following deficits and impairments:   Body Structure / Function / Physical Skills: ADL, FMC, Decreased knowledge of use of DME, Coordination, IADL, ROM, UE functional use, GMC, Strength, Dexterity       Visit Diagnosis: Other abnormalities of gait and mobility  Unsteadiness on feet  Muscle weakness (generalized)  Other lack of coordination  Hemiplegia and hemiparesis following other cerebrovascular disease affecting right dominant side [facility])    Problem List Patient Active Problem List   Diagnosis Date Noted   Slow transit constipation    Stage 3a chronic kidney disease ([place])    Essential hypertension    Small vessel disease, cerebrovascular 04/08/2021   Acute CVA (cerebrovascular accident) [facility]) 04/05/2021    Zachery Conch, OT 06/08/2021, 11:23 AM  [facility] [address] [place], [place], [address] Phone: [phone]   Fax:  [phone]  Name: Christine Fritz MRN: [id number] Date of Birth: 1961-01-22

## 2021-06-08 NOTE — Therapy (Addendum)
[facility] [address] [place], [place], [address] Phone: [phone]   Fax:  [phone]  Physical Therapy Treatment  Patient Details  Name: Christine Fritz MRN: [id number] Date of Birth: 03/07/1961 Referring Provider (PT): Ranelle OysterSwartz, Zachary T, MD   Encounter Date: 06/08/2021   PT End of Session - 06/08/21 0849     Visit Number 8    Number of Visits 17   do not anticipate pt needing all visits   Date for PT Re-Evaluation 07/28/21    Authorization Type Federal [employer] - $30 copay, VL combined with pt, ot, st: 60    Authorization - Number of Visits 60    PT Start Time 0847    PT Stop Time 0927    PT Time Calculation (min) 40 min    Equipment Utilized During Treatment Gait belt    Activity Tolerance Patient tolerated treatment well    Behavior During Therapy WFL for tasks assessed/performed             Past Medical History:  Diagnosis Date   Hypertension    Hypothyroidism    Restless leg    Stroke [facility](HCC)     History reviewed. No pertinent surgical history.  Vitals:   06/08/21 0851 06/08/21 0855 06/08/21 0924  BP: (!) 157/101 (!) 146/92 (!) 146/88  Pulse: 87       Subjective Assessment - 06/08/21 0849     Subjective No changes, no falls. Had a busy weekend.    Pertinent History PMH: CVA, HTN, stage 3 CKD    Limitations Walking;House hold activities    Diagnostic tests CT/MRI showed a 1 cm acute ischemic nonhemorrhagic infarct involving the posterior limb of the left internal capsule.  Remote lacunar infarct of the right basal ganglia/corona radiata.    Patient Stated Goals wants to get back to normal - walking with no device, baking/decorating cakes.    Currently in Pain? No/denies                                    Balance Exercises - 06/08/21 0915       Balance Exercises: Standing   Standing Eyes Closed Narrow base of support (BOS);Foam/compliant surface    Standing Eyes  Closed Limitations On air ex; x30 seconds, 2 x 10 reps head turns, 2 x 10 reps head nods    SLS with Vectors Foam/compliant surface;Intermittent upper extremity assist;Limitations    SLS with Vectors Limitations On air ex; alternating forward cone taps x10 reps each leg, cross body cone taps x10 reps each leg, cues to keep a soft bend in the knees, needing min guard/intermittent UE support   Tandem Gait Forward;Retro;2 reps    Tandem Gait Limitations on outside of // bars, using UE support as needed    Sidestepping Theraband;4 reps;Limitations    Theraband Level (Sidestepping) Level 2 (Red)    Sidestepping Limitations Down and back at countertop x4 reps with red tband around ankles, cues for mini squat first, added to HEP    Sit to Stand Foam/compliant surface;Limitations    Sit to Stand Limitations performed on red balance beam x10 reps    Other Standing Exercises On air ex; alternating side taps to colorful floor bubbles x10 reps each leg, cues to keep a soft bend in the knees   Other Standing Exercises Comments on red beam; alternating heel taps to floor x10 reps each  leg, cues for knee flexion for additional quad activation, occassional min guard for balance.                PT Education - 06/08/21 0931     Education Details Side stepping with resistance addition to HEP    Person(s) Educated Patient    Methods Explanation;Demonstration    Comprehension Verbalized understanding;Returned demonstration              PT Short Term Goals - 05/27/21 1203       PT SHORT TERM GOAL #1   Title Pt will be independent with HEP in order to build upon functional gains made in therapy. ALL STGS DUE 05/27/21    Time 4    Period Weeks    Status Achieved    Target Date 05/27/21      PT SHORT TERM GOAL #2   Title Pt will improve BERG score to at least a 52/56 in order to decr fall risk    Baseline 48/56; 52/56 on 05/27/21    Time 4    Period Weeks    Status Achieved      PT SHORT TERM  GOAL #3   Title Pt will decr 5x sit <> stand without UE support to 15.5 seconds or less in order to demo decr fall risk/improved functional BLE strength    Baseline 11.2 seconds on 05/27/21    Time 4    Period Weeks    Status Achieved      PT SHORT TERM GOAL #4   Title Pt will improve gait speed to at least 3.1 ft/sec with no AD vs. LRAD in order to demo improved gait efficiency.    Baseline 2.87 ft/sec; 3.47 seconds no AD on 06/06/21    Time 4    Period Weeks    Status Achieved      PT SHORT TERM GOAL #5   Title Perform DGI/FGA with LTG written.    Baseline 18/30 FGA on 05/27/21 - LTG written    Time 4    Period Weeks    Status Achieved               PT Long Term Goals - 06/08/21 1230       PT LONG TERM GOAL #1   Title Pt will be independent with final HEP in order to build upon functional gains made in therapy. ALL LTGS DUE 06/24/21    Time 8    Period Weeks    Status New      PT LONG TERM GOAL #2   Title Pt will improve gait speed to at least 3.7 ft/sec with no AD  in order to demo improved gait efficiency.    Baseline 3.47 ft/sec with no AD    Time 8    Period Weeks    Status Revised      PT LONG TERM GOAL #3   Title Pt will improve FGA to at least a 22/30 in order to demo decr fall risk.    Baseline 18/30    Time 8    Period Weeks    Status Revised      PT LONG TERM GOAL #4   Title Pt will go up and down 4 steps with single handrail and step through pattern with mod I in order to safely enter/exit home.    Time 8    Period Weeks    Status New      PT LONG TERM GOAL #5  Title Pt will ambulate at least 500' on unlevel outdoor surfaces with mod I with LRAD vs. no AD in order to demo improved community mobility.    Time 8    Period Weeks    Status New                   Plan - 06/08/21 1230     Clinical Impression Statement Pt's diastolic remaining elevated but still WFL to participate in therapy (needs to be checked with manual cuff vs. automatic  for accurate reading). Today's skilled session worked on hip strengthening and standing balance strategies on compliant surfaces. Pt continues to be challenged with SLS tasks on compliant surfaces, needing UE support/min guard. Will continue to progress towards LTGs.    Personal Factors and Comorbidities Comorbidity 3+;Past/Current Experience    Comorbidities PMH: CVA, HTN, stage 3 CKD    Examination-Activity Limitations Transfers;Stairs;Locomotion Level;Carry;Lift    Examination-Participation Restrictions Community Activity;Driving   baking, crafting   Stability/Clinical Decision Making Evolving/Moderate complexity    Rehab Potential Good    PT Frequency 2x / week    PT Duration 12 weeks   do not anticipate pt needing all visits   PT Treatment/Interventions ADLs/Self Care Home Management;Aquatic Therapy;DME Instruction;Gait training;Functional mobility training;Stair training;Therapeutic activities;Therapeutic exercise;Balance training;Neuromuscular re-education;Patient/family education;Vestibular    PT Next Visit Plan monitor BP (take with manual cuff - automatic was giving too high of a reading).. continue gait with no AD and practice curbs/grass. Hip ABD strengthening. Work on bending down/reaching. RLE strengthening and balance activities - working on SLS, narrow BOS, unlevel surfaces, head motions.    PT Home Exercise Plan [id number]    Consulted and Agree with Plan of Care Patient             Patient will benefit from skilled therapeutic intervention in order to improve the following deficits and impairments:  Abnormal gait, Decreased balance, Decreased coordination, Decreased strength, Difficulty walking, Postural dysfunction, Obesity  Visit Diagnosis: Other abnormalities of gait and mobility  Muscle weakness (generalized)  Unsteadiness on feet  Other lack of coordination     Problem List Patient Active Problem List   Diagnosis Date Noted   Slow transit constipation     Stage 3a chronic kidney disease ([place])    Essential hypertension    Small vessel disease, cerebrovascular 04/08/2021   Acute CVA (cerebrovascular accident) ([place]) 04/05/2021    Arliss Journey, PT, DPT  06/08/2021, 12:32 PM  [facility] [address] [place], [place], [address] Phone: [phone]   Fax:  [phone]  Name: Christine Fritz MRN: [id number] Date of Birth: 1960-06-24

## 2021-06-10 ENCOUNTER — Ambulatory Visit: Payer: Federal, State, Local not specified - PPO

## 2021-06-10 NOTE — Progress Notes (Signed)
Cardiology Office Note:    Date:  06/11/2021   ID:  Christine Fritz, DOB [birth year], MRN [id number]  PCP:  Merri Brunette, MD  Cardiologist:  None  Electrophysiologist:  None   Referring MD: Ihor Austin, NP   Chief Complaint  Patient presents with   Pericardial Effusion    History of Present Illness:    Christine Fritz is a 61 y.o. female with a hx of hypertension, hypothyroidism, CVA who is referred by Ihor Austin, NP for evaluation of pericardial effusion.  She was admitted in November 2022 with acute CVA.  Echocardiogram on 04/05/2021 showed EF 60 to 65%, severe LVH, normal RV function, moderate pericardial effusion anterior to the right ventricle, no significant valvular disease.  Repeat echo 04/08/2021 showed stable moderate effusion anterior to right ventricle.  She reports that she has been doing well.  Denies any chest pain, dyspnea, lightheadedness, syncope, or palpitations.  Does report some lower extremity edema since starting amlodipine.  No smoking history.  No history of heart disease in her immediate family.   Past Medical History:  Diagnosis Date   Hypertension    Hypothyroidism    Restless leg    Stroke (HCC)     No past surgical history on file.  Current Medications: Current Meds  Medication Sig   acetaminophen (TYLENOL) 325 MG tablet Take 2 tablets (650 mg total) by mouth every 4 (four) hours as needed for mild pain (or temp > 37.5 C (99.5 F)).   amLODipine (NORVASC) 10 MG tablet Take 1 tablet (10 mg total) by mouth daily.   aspirin EC 81 MG EC tablet Take 1 tablet (81 mg total) by mouth daily. Swallow whole.   atorvastatin (LIPITOR) 80 MG tablet Take 1 tablet (80 mg total) by mouth daily.   levothyroxine (SYNTHROID) 125 MCG tablet 1 tablet in the morning on an empty stomach   losartan (COZAAR) 50 MG tablet 100 mg 2 (two) times daily. TOTAL OF 100 MG   magnesium oxide (MAG-OX) 400 MG tablet Take 1 tablet (400 mg total) by mouth daily.   rOPINIRole  (REQUIP) 1 MG tablet Take 1 tablet (1 mg total) by mouth at bedtime.     Allergies:   Patient has no known allergies.   Social History   Socioeconomic History   Marital status: Married    Spouse name: Not on file   Number of children: Not on file   Years of education: Not on file   Highest education level: Not on file  Occupational History   Not on file  Tobacco Use   Smoking status: Never   Smokeless tobacco: Never  Vaping Use   Vaping Use: Never used  Substance and Sexual Activity   Alcohol use: Not Currently   Drug use: Not Currently   Sexual activity: Not Currently  Other Topics Concern   Not on file  Social History Narrative   Caffeine:  Tea/ Diet coke 1 16oz daily.  Education: AD. [employer].  Not working. Married 5 kids total.  (Blended family- 2 sets of twins).    Social Determinants of Health   Financial Resource Strain: Not on file  Food Insecurity: Not on file  Transportation Needs: Not on file  Physical Activity: Not on file  Stress: Not on file  Social Connections: Not on file     Family History: The patient's family history includes Hypertension in her father; Rheum arthritis in her brother and sister. There is no history of Stroke.  ROS:  Please see the history of present illness.     All other systems reviewed and are negative.  EKGs/Labs/Other Studies Reviewed:      EKG:   06/11/21: Normal sinus rhythm, rate 79, T wave inversions in leads I, aVL  Recent Labs: 04/06/2021: TSH 219.795 04/07/2021: Magnesium 2.3 04/09/2021: ALT 13; Hemoglobin 12.6; Platelets 221 04/13/2021: BUN 21; Creatinine, Ser 1.59; Potassium 3.8; Sodium 135  Recent Lipid Panel    Component Value Date/Time   CHOL 353 (H) 04/05/2021 0457   TRIG 227 (H) 04/05/2021 0457   HDL 49 04/05/2021 0457   CHOLHDL 7.2 04/05/2021 0457   VLDL 45 (H) 04/05/2021 0457   LDLCALC 259 (H) 04/05/2021 0457    Physical Exam:    VS:  BP 130/77    Pulse 79    Ht 5\' 4"  (1.626 m)    Wt  241 lb 9.6 oz (109.6 kg)    SpO2 96%    BMI 41.47 kg/m     Wt Readings from Last 3 Encounters:  06/11/21 241 lb 9.6 oz (109.6 kg)  06/07/21 239 lb 6.4 oz (108.6 kg)  05/19/21 240 lb (108.9 kg)     GEN:  Well nourished, well developed in no acute distress HEENT: Normal NECK: No JVD; No carotid bruits LYMPHATICS: No lymphadenopathy CARDIAC: RRR, no murmurs, rubs, gallops RESPIRATORY:  Clear to auscultation without rales, wheezing or rhonchi  ABDOMEN: Soft, non-tender, non-distended MUSCULOSKELETAL:  No edema; No deformity  SKIN: Warm and dry NEUROLOGIC:  Alert and oriented x 3 PSYCHIATRIC:  Normal affect   ASSESSMENT:    1. Pericardial effusion   2. Cerebrovascular accident (CVA), unspecified mechanism ([place])   3. Essential hypertension   4. Hyperlipidemia, unspecified hyperlipidemia type   5. Stage 3b chronic kidney disease ([place])    PLAN:    Pericardial effusion: Moderate on echo 03/2021.  We will repeat echo to evaluate for resolution  CVA: In November 2022.  On aspirin, statin.  Per neurology, etiology likely due to small vessel disease.  Will check Zio patch x 2 weeks to evaluate for evidence of Afib.  Hyperlipidemia: On atorvastatin 80 mg daily.  LDL 70 on 05/27/21.  Hypertension: On amlodipine 10 mg daily and losartan 100 mg daily.  Increased dose of losartan by PCP yesterday.  Appears controlled.  CKD stage 3b: Cr 1.7 on 04/09/21  RTC in 6 months   Medication Adjustments/Labs and Tests Ordered: Current medicines are reviewed at length with the patient today.  Concerns regarding medicines are outlined above.  Orders Placed This Encounter  Procedures   LONG TERM MONITOR (3-14 DAYS)   EKG 12-Lead   ECHOCARDIOGRAM COMPLETE   No orders of the defined types were placed in this encounter.   Patient Instructions  Medication Instructions:  The current medical regimen is effective;  continue present plan and medications.  *If you need a refill on your cardiac  medications before your next appointment, please call your pharmacy*   Testing/Procedures: Echocardiogram - Your physician has requested that you have an echocardiogram. Echocardiography is a painless test that uses sound waves to create images of your heart. It provides your doctor with information about the size and shape of your heart and how well your hearts chambers and valves are working. This procedure takes approximately one hour. There are no restrictions for this procedure. This will be performed at either our [facility] location - [address], [place] location [facility] 2nd floor.  ZIO  XT- Long Term Monitor Instructions  Your physician has requested you wear a ZIO patch monitor for 14 days.  This is a single patch monitor. [employer] supplies one patch monitor per enrollment. Additional stickers are not available. Please do not apply patch if you will be having a Nuclear Stress Test,  Echocardiogram, Cardiac CT, MRI, or Chest Xray during the period you would be wearing the  monitor. The patch cannot be worn during these tests. You cannot remove and re-apply the  ZIO XT patch monitor.  Your ZIO patch monitor will be mailed 3 day USPS to your address on file. It may take 3-5 days  to receive your monitor after you have been enrolled.  Once you have received your monitor, please review the enclosed instructions. Your monitor  has already been registered assigning a specific monitor serial # to you.  Billing and Patient Assistance Program Information  We have supplied [employer] with any of your insurance information on file for billing purposes. [employer] offers a sliding scale Patient Assistance Program for patients that do not have  insurance, or whose insurance does not completely cover the cost of the ZIO monitor.  You must apply for the Patient Assistance Program to qualify for this discounted rate.  To apply, please call [employer] at [phone],  select option 4, select option 2, ask to apply for  Patient Assistance Program. Meredeth Ide will ask your household income, and how many people  are in your household. They will quote your out-of-pocket cost based on that information.  [employer] will also be able to set up a 88-month, interest-free payment plan if needed.  Applying the monitor   Shave hair from upper left chest.  Hold abrader disc by orange tab. Rub abrader in 40 strokes over the upper left chest as  indicated in your monitor instructions.  Clean area with 4 enclosed alcohol pads. Let dry.  Apply patch as indicated in monitor instructions. Patch will be placed under collarbone on left  side of chest with arrow pointing upward.  Rub patch adhesive wings for 2 minutes. Remove white label marked "1". Remove the white  label marked "2". Rub patch adhesive wings for 2 additional minutes.  While looking in a mirror, press and release button in center of patch. A small green light will  flash 3-4 times. This will be your only indicator that the monitor has been turned on.  Do not shower for the first 24 hours. You may shower after the first 24 hours.  Press the button if you feel a symptom. You will hear a small click. Record Date, Time and  Symptom in the Patient Logbook.  When you are ready to remove the patch, follow instructions on the last 2 pages of Patient  Logbook. Stick patch monitor onto the last page of Patient Logbook.  Place Patient Logbook in the blue and white box. Use locking tab on box and tape box closed  securely. The blue and white box has prepaid postage on it. Please place it in the mailbox as  soon as possible. Your physician should have your test results approximately 7 days after the  monitor has been mailed back to [facility]  Call [facility] Customer Care at [phone] if you have questions regarding  your ZIO XT patch monitor. Call them immediately if you see an orange light blinking on your   monitor.  If your monitor falls off in less than 4 days, contact our Monitor department at [phone].  If your  monitor becomes loose or falls off after 4 days call [employer] at [phone] for  suggestions on securing your monitor    Follow-Up: At [facility], you and your health needs are our priority.  As part of our continuing mission to provide you with exceptional heart care, we have created designated Provider Care Teams.  These Care Teams include your primary Cardiologist (physician) and Advanced Practice Providers (APPs -  Physician Assistants and Nurse Practitioners) who all work together to provide you with the care you need, when you need it.  We recommend signing up for the patient portal called "MyChart".  Sign up information is provided on this After Visit Summary.  MyChart is used to connect with patients for Virtual Visits (Telemedicine).  Patients are able to view lab/test results, encounter notes, upcoming appointments, etc.  Non-urgent messages can be sent to your provider as well.   To learn more about what you can do with MyChart, go to [email or web address].    Your next appointment:   6 month(s)  The format for your next appointment:   In Person  Provider:   Oswaldo Milian, MD      Signed, Donato Heinz, MD  06/11/2021 11:30 AM    [place]

## 2021-06-11 ENCOUNTER — Other Ambulatory Visit: Payer: Self-pay

## 2021-06-11 ENCOUNTER — Encounter: Payer: Self-pay | Admitting: Cardiology

## 2021-06-11 ENCOUNTER — Other Ambulatory Visit: Payer: Self-pay | Admitting: Cardiology

## 2021-06-11 ENCOUNTER — Ambulatory Visit: Payer: Federal, State, Local not specified - PPO | Admitting: Cardiology

## 2021-06-11 ENCOUNTER — Ambulatory Visit (INDEPENDENT_AMBULATORY_CARE_PROVIDER_SITE_OTHER): Payer: Federal, State, Local not specified - PPO

## 2021-06-11 VITALS — BP 130/77 | HR 79 | Ht 64.0 in | Wt 241.6 lb

## 2021-06-11 DIAGNOSIS — I4891 Unspecified atrial fibrillation: Secondary | ICD-10-CM

## 2021-06-11 DIAGNOSIS — I10 Essential (primary) hypertension: Secondary | ICD-10-CM | POA: Diagnosis not present

## 2021-06-11 DIAGNOSIS — I639 Cerebral infarction, unspecified: Secondary | ICD-10-CM

## 2021-06-11 DIAGNOSIS — I3139 Other pericardial effusion (noninflammatory): Secondary | ICD-10-CM

## 2021-06-11 DIAGNOSIS — E785 Hyperlipidemia, unspecified: Secondary | ICD-10-CM | POA: Diagnosis not present

## 2021-06-11 DIAGNOSIS — N1832 Chronic kidney disease, stage 3b: Secondary | ICD-10-CM

## 2021-06-11 NOTE — Progress Notes (Unsigned)
Enrolled patient for a 14 day Zio XT  monitor to be mailed to patients home  °

## 2021-06-11 NOTE — Patient Instructions (Signed)
Medication Instructions:  The current medical regimen is effective;  continue present plan and medications.  *If you need a refill on your cardiac medications before your next appointment, please call your pharmacy*   Testing/Procedures: Echocardiogram - Your physician has requested that you have an echocardiogram. Echocardiography is a painless test that uses sound waves to create images of your heart. It provides your doctor with information about the size and shape of your heart and how well your hearts chambers and valves are working. This procedure takes approximately one hour. There are no restrictions for this procedure. This will be performed at either our [facility] location - [address], [place] location [facility] 2nd floor.  ZIO XT- Long Term Monitor Instructions  Your physician has requested you wear a ZIO patch monitor for 14 days.  This is a single patch monitor. [employer] supplies one patch monitor per enrollment. Additional stickers are not available. Please do not apply patch if you will be having a Nuclear Stress Test,  Echocardiogram, Cardiac CT, MRI, or Chest Xray during the period you would be wearing the  monitor. The patch cannot be worn during these tests. You cannot remove and re-apply the  ZIO XT patch monitor.  Your ZIO patch monitor will be mailed 3 day USPS to your address on file. It may take 3-5 days  to receive your monitor after you have been enrolled.  Once you have received your monitor, please review the enclosed instructions. Your monitor  has already been registered assigning a specific monitor serial # to you.  Billing and Patient Assistance Program Information  We have supplied [employer] with any of your insurance information on file for billing purposes. [employer] offers a sliding scale Patient Assistance Program for patients that do not have  insurance, or whose insurance does not completely cover the cost of the  ZIO monitor.  You must apply for the Patient Assistance Program to qualify for this discounted rate.  To apply, please call [employer] at [phone], select option 4, select option 2, ask to apply for  Patient Assistance Program. Theodore Demark will ask your household income, and how many people  are in your household. They will quote your out-of-pocket cost based on that information.  [employer] will also be able to set up a 80-month, interest-free payment plan if needed.  Applying the monitor   Shave hair from upper left chest.  Hold abrader disc by orange tab. Rub abrader in 40 strokes over the upper left chest as  indicated in your monitor instructions.  Clean area with 4 enclosed alcohol pads. Let dry.  Apply patch as indicated in monitor instructions. Patch will be placed under collarbone on left  side of chest with arrow pointing upward.  Rub patch adhesive wings for 2 minutes. Remove white label marked "1". Remove the white  label marked "2". Rub patch adhesive wings for 2 additional minutes.  While looking in a mirror, press and release button in center of patch. A small green light will  flash 3-4 times. This will be your only indicator that the monitor has been turned on.  Do not shower for the first 24 hours. You may shower after the first 24 hours.  Press the button if you feel a symptom. You will hear a small click. Record Date, Time and  Symptom in the Patient Logbook.  When you are ready to remove the patch, follow instructions on the last 2 pages of Patient  Logbook. Stick patch monitor onto the last page of Patient Logbook.  Place Patient Logbook in the blue and white box. Use locking tab on box and tape box closed  securely. The blue and white box has prepaid postage on it. Please place it in the mailbox as  soon as possible. Your physician should have your test results approximately 7 days after the  monitor has been mailed back to [facility].  Call [facility] Customer  Care at [phone] if you have questions regarding  your ZIO XT patch monitor. Call them immediately if you see an orange light blinking on your  monitor.  If your monitor falls off in less than 4 days, contact our Monitor department at [phone].  If your monitor becomes loose or falls off after 4 days call [employer] at [phone] for  suggestions on securing your monitor    Follow-Up: At [facility], you and your health needs are our priority.  As part of our continuing mission to provide you with exceptional heart care, we have created designated Provider Care Teams.  These Care Teams include your primary Cardiologist (physician) and Advanced Practice Providers (APPs -  Physician Assistants and Nurse Practitioners) who all work together to provide you with the care you need, when you need it.  We recommend signing up for the patient portal called "MyChart".  Sign up information is provided on this After Visit Summary.  MyChart is used to connect with patients for Virtual Visits (Telemedicine).  Patients are able to view lab/test results, encounter notes, upcoming appointments, etc.  Non-urgent messages can be sent to your provider as well.   To learn more about what you can do with MyChart, go to [email or web address].    Your next appointment:   6 month(s)  The format for your next appointment:   In Person  Provider:   Epifanio Lesches, MD

## 2021-06-15 ENCOUNTER — Ambulatory Visit: Payer: Federal, State, Local not specified - PPO | Admitting: Physical Therapy

## 2021-06-15 ENCOUNTER — Other Ambulatory Visit: Payer: Self-pay

## 2021-06-15 ENCOUNTER — Encounter: Payer: Self-pay | Admitting: Physical Therapy

## 2021-06-15 ENCOUNTER — Ambulatory Visit: Payer: Federal, State, Local not specified - PPO | Admitting: Occupational Therapy

## 2021-06-15 ENCOUNTER — Encounter: Payer: Self-pay | Admitting: Occupational Therapy

## 2021-06-15 VITALS — BP 130/82

## 2021-06-15 DIAGNOSIS — R278 Other lack of coordination: Secondary | ICD-10-CM

## 2021-06-15 DIAGNOSIS — R2681 Unsteadiness on feet: Secondary | ICD-10-CM

## 2021-06-15 DIAGNOSIS — R2689 Other abnormalities of gait and mobility: Secondary | ICD-10-CM

## 2021-06-15 DIAGNOSIS — M6281 Muscle weakness (generalized): Secondary | ICD-10-CM

## 2021-06-15 DIAGNOSIS — I69851 Hemiplegia and hemiparesis following other cerebrovascular disease affecting right dominant side: Secondary | ICD-10-CM | POA: Diagnosis not present

## 2021-06-15 NOTE — Therapy (Addendum)
[facility] [address] [place], [place], [address] Phone: [phone]   Fax:  [phone]  Physical Therapy Treatment  Patient Details  Name: Christine Fritz MRN: [id number] Date of Birth: 12-Oct-1960 Referring Provider (PT): Meredith Staggers, MD   Encounter Date: 06/15/2021   PT End of Session - 06/15/21 0937     Visit Number 9    Number of Visits 17   do not anticipate pt needing all visits   Date for PT Re-Evaluation 07/28/21    Authorization Type Federal BCBS - $30 copay, VL combined with pt, ot, st: 19    Authorization - Number of Visits 60    PT Start Time 0934    PT Stop Time 1013    PT Time Calculation (min) 39 min    Equipment Utilized During Treatment Gait belt    Activity Tolerance Patient tolerated treatment well    Behavior During Therapy WFL for tasks assessed/performed             Past Medical History:  Diagnosis Date   Hypertension    Hypothyroidism    Restless leg    Stroke [facility])     History reviewed. No pertinent surgical history.  Vitals:   06/15/21 0940  BP: 130/82     Subjective Assessment - 06/15/21 0936     Subjective Saw the cardiologist last week - will be starting on a heart monitor. PCP incr losartan to 100 mg a day. No falls. Still feels off balance with bending over    Pertinent History PMH: CVA, HTN, stage 3 CKD    Limitations Walking;House hold activities    Diagnostic tests CT/MRI showed a 1 cm acute ischemic nonhemorrhagic infarct involving the posterior limb of the left internal capsule.  Remote lacunar infarct of the right basal ganglia/corona radiata.    Patient Stated Goals wants to get back to normal - walking with no device, baking/decorating cakes.    Currently in Pain? No/denies                               [facility] Adult PT Treatment/Exercise - 06/15/21 0001       Ambulation/Gait   Ambulation/Gait Yes    Ambulation/Gait Assistance 5:  Supervision    Ambulation/Gait Assistance Details Between activities in session with no AD    Assistive device None    Gait Pattern Step-through pattern;Decreased stance time - right;Decreased step length - right;Decreased arm swing - right;Decreased dorsiflexion - right    Ambulation Surface Level;Indoor                 Balance Exercises - 06/15/21 0942       Balance Exercises: Standing   SLS with Vectors Solid surface;Foam/compliant surface    SLS with Vectors Limitations Solid surface on RLE - taps to colorful floor dots in semi circle and across body on L side with therapist calling out random colors to touch - incr difficulty crossing midline and posteriorly. then standing on air ex with RLE as stance leg - clock taps with LLE to 12:00, 9:00, 6:00, intermittent min guard for balance    Rockerboard Anterior/posterior;Head turns;Limitations    Rockerboard Limitations x10 reps head turns, x10 reps head nods, x10 reps mini squats, eyes closed keeping board steady 2 x 30 seconds, x5 reps head nods, x5 reps head turns - with intermittent taps to bars for balance    Partial  Tandem Stance Eyes open;2 reps;30 secs;Limitations    Partial Tandem Stance Limitations On air ex, performed bilat    Other Standing Exercises Working on squatting on level ground and picking up 8 cones, then performed on air ex picking up cones and then placing back down on floor. Pt reporting feeling unsteady but no overt LOB    Other Standing Exercises Comments On red beam; alternating SLS taps to 6" step x10 reps each side then to 2nd step x10 reps with min guard/intermittent taps to handrails for balance                  PT Short Term Goals - 05/27/21 1203       PT SHORT TERM GOAL #1   Title Pt will be independent with HEP in order to build upon functional gains made in therapy. ALL STGS DUE 05/27/21    Time 4    Period Weeks    Status Achieved    Target Date 05/27/21      PT SHORT TERM GOAL #2    Title Pt will improve BERG score to at least a 52/56 in order to decr fall risk    Baseline 48/56; 52/56 on 05/27/21    Time 4    Period Weeks    Status Achieved      PT SHORT TERM GOAL #3   Title Pt will decr 5x sit <> stand without UE support to 15.5 seconds or less in order to demo decr fall risk/improved functional BLE strength    Baseline 11.2 seconds on 05/27/21    Time 4    Period Weeks    Status Achieved      PT SHORT TERM GOAL #4   Title Pt will improve gait speed to at least 3.1 ft/sec with no AD vs. LRAD in order to demo improved gait efficiency.    Baseline 2.87 ft/sec; 3.47 seconds no AD on 06/06/21    Time 4    Period Weeks    Status Achieved      PT SHORT TERM GOAL #5   Title Perform DGI/FGA with LTG written.    Baseline 18/30 FGA on 05/27/21 - LTG written    Time 4    Period Weeks    Status Achieved               PT Long Term Goals - 06/08/21 1230       PT LONG TERM GOAL #1   Title Pt will be independent with final HEP in order to build upon functional gains made in therapy. ALL LTGS DUE 06/24/21    Time 8    Period Weeks    Status New      PT LONG TERM GOAL #2   Title Pt will improve gait speed to at least 3.7 ft/sec with no AD  in order to demo improved gait efficiency.    Baseline 3.47 ft/sec with no AD    Time 8    Period Weeks    Status Revised      PT LONG TERM GOAL #3   Title Pt will improve FGA to at least a 22/30 in order to demo decr fall risk.    Baseline 18/30    Time 8    Period Weeks    Status Revised      PT LONG TERM GOAL #4   Title Pt will go up and down 4 steps with single handrail and step through pattern with mod I  in order to safely enter/exit home.    Time 8    Period Weeks    Status New      PT LONG TERM GOAL #5   Title Pt will ambulate at least 500' on unlevel outdoor surfaces with mod I with LRAD vs. no AD in order to demo improved community mobility.    Time 8    Period Weeks    Status New                    Plan - 06/15/21 1332     Clinical Impression Statement Today's skilled session continued to focus on standing balance strategies on compliant surfaces and SLS tasks. Pt challenged more by SLS tasks on RLE, esp on unlevel surface. Able to progress balance on rockerboard by adding in eyes closed and head motions. Pt tolerated session well, will continue to progress towards LTGs.    Personal Factors and Comorbidities Comorbidity 3+;Past/Current Experience    Comorbidities PMH: CVA, HTN, stage 3 CKD    Examination-Activity Limitations Transfers;Stairs;Locomotion Level;Carry;Lift    Examination-Participation Restrictions Community Activity;Driving   baking, crafting   Stability/Clinical Decision Making Evolving/Moderate complexity    Rehab Potential Good    PT Frequency 2x / week    PT Duration 12 weeks   do not anticipate pt needing all visits   PT Treatment/Interventions ADLs/Self Care Home Management;Aquatic Therapy;DME Instruction;Gait training;Functional mobility training;Stair training;Therapeutic activities;Therapeutic exercise;Balance training;Neuromuscular re-education;Patient/family education;Vestibular    PT Next Visit Plan monitor BP (take with manual cuff - automatic was giving too high of a reading). continue gait with no AD and practice curbs/grass. Hip ABD strengthening. Work on bending down/reaching. RLE strengthening and balance activities - working on SLS, narrow BOS, unlevel surfaces, head motions.    PT Home Exercise Plan [id number]    Consulted and Agree with Plan of Care Patient             Patient will benefit from skilled therapeutic intervention in order to improve the following deficits and impairments:  Abnormal gait, Decreased balance, Decreased coordination, Decreased strength, Difficulty walking, Postural dysfunction, Obesity  Visit Diagnosis: Other abnormalities of gait and mobility  Unsteadiness on feet  Muscle weakness  (generalized)     Problem List Patient Active Problem List   Diagnosis Date Noted   Slow transit constipation    Stage 3a chronic kidney disease ([employer])    Essential hypertension    Small vessel disease, cerebrovascular 04/08/2021   Acute CVA (cerebrovascular accident) ([place]) 04/05/2021    Arliss Journey, PT, DPT  06/15/2021, 1:33 PM  [facility] [address] [place], [place], [address] Phone: [phone]   Fax:  [phone]  Name: Christine Fritz MRN: [id number] Date of Birth: Jan 21, 1961

## 2021-06-15 NOTE — Therapy (Signed)
[facility] [address] [place], [place], [address] Phone: [phone]   Fax:  [phone]  Occupational Therapy Treatment  Patient Details  Name: Christine Fritz MRN: [id number] Date of Birth: 1960/09/24 Referring Provider (OT): Alger Simons, MD   Encounter Date: 06/15/2021   OT End of Session - 06/15/21 0840     Visit Number 8    Number of Visits 11   6+5   Date for OT Re-Evaluation 07/13/21   5 visits over 6 weeks at renewal   Authorization Type BCBS    Authorization Time Period $30 copay VL:50 combined No Auth    Authorization - Number of Visits 25    OT Start Time 0848    OT Stop Time 0929    OT Time Calculation (min) 41 min    Activity Tolerance Patient tolerated treatment well    Behavior During Therapy [facility] for tasks assessed/performed             Past Medical History:  Diagnosis Date   Hypertension    Hypothyroidism    Restless leg    Stroke [facility])     History reviewed. No pertinent surgical history.  There were no vitals filed for this visit.   Subjective Assessment - 06/15/21 0840     Subjective  Pt denies any changes, denies pain.  Pt reports that is slowly been doing some baking and crafts.    Pertinent History PMH: none    Limitations Fall Risk    Patient Stated Goals "get back to baking and crafting"    Currently in Pain? No/denies              Placing [place]'connor pegs in pegboard with tweezers with mod cueing initially for hand position and technique, but improved with repetition and cueing.  Threading [place] pieces on fishing line for bilateral hand coordination with min difficulty and incr time, then removing and sorting pieces with R hand.  Functional reaching to place small pegs in vertical pegboard to copy design for incr coordination and RUE activity tolerance with min difficulty.  Removing using in-hand manipulation with min difficulty.   Practiced writing.  Pt wrote 1  sentence with good legibility/control.  Practiced continuous "l" with min decr fluidity/control.    Picking up blocks with gripper set on level 2 (black spring) for sustained grip strength with min-mod difficulty.  Rotating ball in fingertips (each direction) with min difficulty sustaining grasp at times.            OT Short Term Goals - 05/25/21 1021       OT SHORT TERM GOAL #1   Title Pt will be independent with HEP for coordination    Time 4    Period Weeks    Status Achieved    Target Date 05/26/21      OT SHORT TERM GOAL #2   Title Pt will bake cupcakes with mod I and good safety awareness    Time 4    Period Weeks    Status Achieved      OT SHORT TERM GOAL #3   Title Pt will verbalize understanding of adapated strategies and/or equipment for increasing independence and ease with completeing ADLs and IADLs (baking, crafts, etc)    Time 4    Period Weeks    Status Achieved               OT Long Term Goals - 06/01/21 [id number]  OT LONG TERM GOAL #1   Title Pt will be independent with grip strength HEP.    Time 8    Period Weeks    Status Achieved    Target Date 06/23/21      OT LONG TERM GOAL #2   Title Pt will increase grip strength by 5 lbs or greater for increasing strength in dominant hand.    Baseline R 65, L 68.1    Time 8    Period Weeks    Status On-going   63.4 lbs RUE     OT LONG TERM GOAL #3   Title Pt will improve legibility and fluidity of handwriting with RUE to 100% of prior level    Baseline 75%    Time 8    Period Weeks    Status On-going   pt reports about 80% of prior level     OT LONG TERM GOAL #4   Title Pt will increase coordination in RUE evidenced by completing 9 hole peg test in 25 seconds or less.    Baseline R 31.13s, L 24.06s    Time 8    Period Weeks    Status On-going   23.44s 06/01/21 RUE - inconsistent     OT LONG TERM GOAL #5   Title Pt will report baking and decorating cupcakes and/or cake and/or completing  crafting task (floral arrangements) with mod I    Time 8    Period Weeks    Status On-going   worked on tying tiny bows and gluing over the weekend but with safety concerns1/10/23     OT LONG TERM GOAL #6   Title Pt will be able to use BUE to peel a potato with increased ease and coordination    Time 6    Period Weeks    Status New    Target Date 07/13/21      OT LONG TERM GOAL #7   Title Pt will report increased ease and decreased fatigue with stirring batter for cupcakes.    Time 6    Period Weeks    Status New                   Plan - 06/15/21 0840     Clinical Impression Statement Pt continues to demonstrate increased coordination and grip strength, but needs incr time for coordination tasks.    OT Occupational Profile and History Problem Focused Assessment - Including review of records relating to presenting problem    Occupational performance deficits (Please refer to evaluation for details): IADL's;ADL's;Leisure    Body Structure / Function / Physical Skills ADL;FMC;Decreased knowledge of use of DME;Coordination;IADL;ROM;UE functional use;GMC;Strength;Dexterity    Rehab Potential Good    Clinical Decision Making Limited treatment options, no task modification necessary    Comorbidities Affecting Occupational Performance: None    Modification or Assistance to Complete Evaluation  No modification of tasks or assist necessary to complete eval    OT Frequency 1x / week    OT Duration 6 weeks   5 visits over 6 weeks d/t any scheduling conflicts at renewal   OT Treatment/Interventions Self-care/ADL training;DME and/or AE instruction;Functional Mobility Training;Manual Therapy;Patient/family education;Passive range of motion;Neuromuscular education;Therapeutic exercise;Therapeutic activities    Plan bake cupcakes or brownies, in hand manipulation, sustained grasp, grip strength    Consulted and Agree with Plan of Care Patient             Patient will benefit from  skilled therapeutic intervention in order  to improve the following deficits and impairments:   Body Structure / Function / Physical Skills: ADL, FMC, Decreased knowledge of use of DME, Coordination, IADL, ROM, UE functional use, GMC, Strength, Dexterity       Visit Diagnosis: Other lack of coordination  Muscle weakness (generalized)  Hemiplegia and hemiparesis following other cerebrovascular disease affecting right dominant side [facility])    Problem List Patient Active Problem List   Diagnosis Date Noted   Slow transit constipation    Stage 3a chronic kidney disease ([place])    Essential hypertension    Small vessel disease, cerebrovascular 04/08/2021   Acute CVA (cerebrovascular accident) ([place]) 04/05/2021    Rainah Kirshner, OT 06/15/2021, 9:45 AM  [facility] [address] [place], [place], [address] Phone: [phone]   Fax:  [phone]  Name: Christine Fritz MRN: [id number] Date of Birth: [birth year]  Vianne Bulls, OTR/L [facility] [address] [place], [place]  [address] [phone] phone [phone] 06/15/21 9:45 AM

## 2021-06-17 ENCOUNTER — Ambulatory Visit: Payer: Federal, State, Local not specified - PPO

## 2021-06-17 ENCOUNTER — Other Ambulatory Visit: Payer: Self-pay

## 2021-06-17 VITALS — BP 136/101

## 2021-06-17 DIAGNOSIS — R2689 Other abnormalities of gait and mobility: Secondary | ICD-10-CM | POA: Diagnosis not present

## 2021-06-17 DIAGNOSIS — M6281 Muscle weakness (generalized): Secondary | ICD-10-CM | POA: Diagnosis not present

## 2021-06-17 DIAGNOSIS — I69851 Hemiplegia and hemiparesis following other cerebrovascular disease affecting right dominant side: Secondary | ICD-10-CM | POA: Diagnosis not present

## 2021-06-17 DIAGNOSIS — R2681 Unsteadiness on feet: Secondary | ICD-10-CM | POA: Diagnosis not present

## 2021-06-17 DIAGNOSIS — R278 Other lack of coordination: Secondary | ICD-10-CM | POA: Diagnosis not present

## 2021-06-17 NOTE — Therapy (Signed)
[facility] [address] [place], [place], [address] Phone: [phone]   Fax:  [phone]  Physical Therapy Treatment  Patient Details  Name: Christine Fritz MRN: [id number] Date of Birth: [birth year] Referring Provider (PT): Meredith Staggers, MD   Encounter Date: 06/17/2021   PT End of Session - 06/17/21 1019     Visit Number 10    Number of Visits 17   do not anticipate pt needing all visits   Date for PT Re-Evaluation 07/28/21    Authorization Type [employer] - $30 copay, VL combined with pt, ot, st: 90    Authorization - Number of Visits 60    PT Start Time 1016    PT Stop Time 1059    PT Time Calculation (min) 43 min    Equipment Utilized During Treatment Gait belt    Activity Tolerance Patient tolerated treatment well    Behavior During Therapy WFL for tasks assessed/performed             Past Medical History:  Diagnosis Date   Hypertension    Hypothyroidism    Restless leg    Stroke ([place])     No past surgical history on file.  Vitals:   06/17/21 1022  BP: (!) 136/101     Subjective Assessment - 06/17/21 1019     Subjective Patient reports she is going to be having a ECHO next week, so will start heart monitor after that. No falls.    Pertinent History PMH: CVA, HTN, stage 3 CKD    Limitations Walking;House hold activities    Diagnostic tests CT/MRI showed a 1 cm acute ischemic nonhemorrhagic infarct involving the posterior limb of the left internal capsule.  Remote lacunar infarct of the right basal ganglia/corona radiata.    Patient Stated Goals wants to get back to normal - walking with no device, baking/decorating cakes.    Currently in Pain? No/denies                [facility] Adult PT Treatment/Exercise - 06/17/21 0001       Ambulation/Gait   Ambulation/Gait Yes    Ambulation/Gait Assistance 5: Supervision    Ambulation/Gait Assistance Details throughout therapy gym without AD     Assistive device None    Gait Pattern Step-through pattern;Decreased stance time - right;Decreased step length - right;Decreased arm swing - right;Decreased dorsiflexion - right    Ambulation Surface Level;Indoor      Neuro Re-ed    Neuro Re-ed Details  Completed dynamic reaching down to ground and laterally to cones standing on firm surface x 6 reps, then completed on blue mat x 6 reps, one instance of imbalance noted. Then progressed to completed standing on blue mat and completed reaching with crossover to R/L x 6 reps.      Exercises   Exercises Knee/Hip      Knee/Hip Exercises: Aerobic   Nustep Completed wit BLE/BUE on Level 4 x 5 minutes. Patient tolerating well. Monitoring BP with activity: 138/94               Balance Exercises - 06/17/21 0001       Balance Exercises: Standing   SLS with Vectors Foam/compliant surface;Intermittent upper extremity assist;Limitations    SLS with Vectors Limitations standing across red balance beam: with single UE support completed alternating toe taps x 15 reps to cones, slow progression to no UE support with increased challenge noted.    Balance Beam standing across  red balance beam with EO: horizontal/vertical head turns x 10 reps each, increased challenge with vertical. Then completed standing with EC 3 x 30 seconds.    Tandem Gait Forward;Foam/compliant surface;3 reps;Limitations    Tandem Gait Limitations in // bars on blue balance beam, completed x 3 reps.                  PT Short Term Goals - 05/27/21 1203       PT SHORT TERM GOAL #1   Title Pt will be independent with HEP in order to build upon functional gains made in therapy. ALL STGS DUE 05/27/21    Time 4    Period Weeks    Status Achieved    Target Date 05/27/21      PT SHORT TERM GOAL #2   Title Pt will improve BERG score to at least a 52/56 in order to decr fall risk    Baseline 48/56; 52/56 on 05/27/21    Time 4    Period Weeks    Status Achieved      PT  SHORT TERM GOAL #3   Title Pt will decr 5x sit <> stand without UE support to 15.5 seconds or less in order to demo decr fall risk/improved functional BLE strength    Baseline 11.2 seconds on 05/27/21    Time 4    Period Weeks    Status Achieved      PT SHORT TERM GOAL #4   Title Pt will improve gait speed to at least 3.1 ft/sec with no AD vs. LRAD in order to demo improved gait efficiency.    Baseline 2.87 ft/sec; 3.47 seconds no AD on 06/06/21    Time 4    Period Weeks    Status Achieved      PT SHORT TERM GOAL #5   Title Perform DGI/FGA with LTG written.    Baseline 18/30 FGA on 05/27/21 - LTG written    Time 4    Period Weeks    Status Achieved               PT Long Term Goals - 06/08/21 1230       PT LONG TERM GOAL #1   Title Pt will be independent with final HEP in order to build upon functional gains made in therapy. ALL LTGS DUE 06/24/21    Time 8    Period Weeks    Status New      PT LONG TERM GOAL #2   Title Pt will improve gait speed to at least 3.7 ft/sec with no AD  in order to demo improved gait efficiency.    Baseline 3.47 ft/sec with no AD    Time 8    Period Weeks    Status Revised      PT LONG TERM GOAL #3   Title Pt will improve FGA to at least a 22/30 in order to demo decr fall risk.    Baseline 18/30    Time 8    Period Weeks    Status Revised      PT LONG TERM GOAL #4   Title Pt will go up and down 4 steps with single handrail and step through pattern with mod I in order to safely enter/exit home.    Time 8    Period Weeks    Status New      PT LONG TERM GOAL #5   Title Pt will ambulate at least 500' on unlevel  outdoor surfaces with mod I with LRAD vs. no AD in order to demo improved community mobility.    Time 8    Period Weeks    Status New                 Plan - 06/17/21 1023     Clinical Impression Statement Diastolic BP elevated at start of session, require seated rest break with improvements noted. Completed activity  tolerance/endurance on NuStep with vitals staying stable. Continued rest of session focused on high level balance and SLS activities. Will continue to progress toward all LTGs.    Personal Factors and Comorbidities Comorbidity 3+;Past/Current Experience    Comorbidities PMH: CVA, HTN, stage 3 CKD    Examination-Activity Limitations Transfers;Stairs;Locomotion Level;Carry;Lift    Examination-Participation Restrictions Community Activity;Driving   baking, crafting   Stability/Clinical Decision Making Evolving/Moderate complexity    Rehab Potential Good    PT Frequency 2x / week    PT Duration 12 weeks   do not anticipate pt needing all visits   PT Treatment/Interventions ADLs/Self Care Home Management;Aquatic Therapy;DME Instruction;Gait training;Functional mobility training;Stair training;Therapeutic activities;Therapeutic exercise;Balance training;Neuromuscular re-education;Patient/family education;Vestibular    PT Next Visit Plan monitor BP (take with manual cuff - automatic was giving too high of a reading). continue gait with no AD and practice curbs/grass. Hip ABD strengthening. Work on bending down/reaching. RLE strengthening and balance activities - working on SLS, narrow BOS, unlevel surfaces, head motions.    PT Home Exercise Plan [id number]    Consulted and Agree with Plan of Care Patient             Patient will benefit from skilled therapeutic intervention in order to improve the following deficits and impairments:  Abnormal gait, Decreased balance, Decreased coordination, Decreased strength, Difficulty walking, Postural dysfunction, Obesity  Visit Diagnosis: Unsteadiness on feet  Muscle weakness (generalized)  Other abnormalities of gait and mobility     Problem List Patient Active Problem List   Diagnosis Date Noted   Slow transit constipation    Stage 3a chronic kidney disease ([place])    Essential hypertension    Small vessel disease, cerebrovascular 04/08/2021    Acute CVA (cerebrovascular accident) ([place]) 04/05/2021    Jones Bales, PT, DPT 06/17/2021, 11:08 AM  [facility] [address] [place], [place], [address] Phone: [phone]   Fax:  [phone]  Name: Christine Fritz MRN: [id number] Date of Birth: 1960/09/16

## 2021-06-22 ENCOUNTER — Ambulatory Visit: Payer: Federal, State, Local not specified - PPO | Admitting: Occupational Therapy

## 2021-06-22 ENCOUNTER — Ambulatory Visit: Payer: Federal, State, Local not specified - PPO | Admitting: Physical Therapy

## 2021-06-23 ENCOUNTER — Ambulatory Visit (HOSPITAL_COMMUNITY): Payer: Federal, State, Local not specified - PPO | Attending: Cardiology

## 2021-06-23 ENCOUNTER — Other Ambulatory Visit: Payer: Self-pay

## 2021-06-23 DIAGNOSIS — I639 Cerebral infarction, unspecified: Secondary | ICD-10-CM | POA: Diagnosis not present

## 2021-06-23 DIAGNOSIS — I3139 Other pericardial effusion (noninflammatory): Secondary | ICD-10-CM

## 2021-06-23 DIAGNOSIS — I4891 Unspecified atrial fibrillation: Secondary | ICD-10-CM | POA: Diagnosis not present

## 2021-06-23 LAB — ECHOCARDIOGRAM COMPLETE
Area-P 1/2: 3.6 cm2
S' Lateral: 1.8 cm

## 2021-06-23 MED ORDER — PERFLUTREN LIPID MICROSPHERE
1.0000 mL | INTRAVENOUS | Status: AC | PRN
  Administered 2021-06-23: 2 mL via INTRAVENOUS

## 2021-06-24 ENCOUNTER — Encounter: Payer: Self-pay | Admitting: *Deleted

## 2021-06-24 ENCOUNTER — Other Ambulatory Visit: Payer: Self-pay | Admitting: *Deleted

## 2021-06-24 ENCOUNTER — Ambulatory Visit: Payer: Federal, State, Local not specified - PPO

## 2021-06-24 DIAGNOSIS — I517 Cardiomegaly: Secondary | ICD-10-CM

## 2021-06-29 ENCOUNTER — Encounter: Payer: Federal, State, Local not specified - PPO | Admitting: Occupational Therapy

## 2021-06-29 ENCOUNTER — Ambulatory Visit: Payer: Federal, State, Local not specified - PPO | Admitting: Physical Therapy

## 2021-07-01 ENCOUNTER — Ambulatory Visit: Payer: Federal, State, Local not specified - PPO

## 2021-07-06 ENCOUNTER — Ambulatory Visit: Payer: Federal, State, Local not specified - PPO | Admitting: Physical Therapy

## 2021-07-06 ENCOUNTER — Encounter: Payer: Federal, State, Local not specified - PPO | Admitting: Occupational Therapy

## 2021-07-08 ENCOUNTER — Ambulatory Visit: Payer: Federal, State, Local not specified - PPO | Admitting: Physical Therapy

## 2021-07-16 DIAGNOSIS — R946 Abnormal results of thyroid function studies: Secondary | ICD-10-CM | POA: Diagnosis not present

## 2021-07-20 ENCOUNTER — Encounter: Payer: Federal, State, Local not specified - PPO | Admitting: Physical Medicine and Rehabilitation

## 2021-07-22 DIAGNOSIS — I517 Cardiomegaly: Secondary | ICD-10-CM | POA: Diagnosis not present

## 2021-07-22 LAB — CBC
Hematocrit: 36.9 % (ref 34.0–46.6)
Hemoglobin: 12.2 g/dL (ref 11.1–15.9)
MCH: 27 pg (ref 26.6–33.0)
MCHC: 33.1 g/dL (ref 31.5–35.7)
MCV: 82 fL (ref 79–97)
Platelets: 320 10*3/uL (ref 150–450)
RBC: 4.52 x10E6/uL (ref 3.77–5.28)
RDW: 13.6 % (ref 11.7–15.4)
WBC: 7.7 10*3/uL (ref 3.4–10.8)

## 2021-07-28 ENCOUNTER — Ambulatory Visit (HOSPITAL_COMMUNITY): Payer: Federal, State, Local not specified - PPO

## 2021-08-12 ENCOUNTER — Telehealth: Payer: Self-pay

## 2021-08-12 NOTE — Telephone Encounter (Signed)
LVM for pt to call me back to schedule sleep study  

## 2021-09-03 ENCOUNTER — Encounter: Payer: Self-pay | Admitting: *Deleted

## 2021-09-03 DIAGNOSIS — Z006 Encounter for examination for normal comparison and control in clinical research program: Secondary | ICD-10-CM

## 2021-09-03 NOTE — Research (Signed)
Spoke with Ms Geyer about [employer]. She states she is not interested. ?

## 2021-09-14 ENCOUNTER — Telehealth: Payer: Self-pay

## 2021-09-14 DIAGNOSIS — E782 Mixed hyperlipidemia: Secondary | ICD-10-CM | POA: Diagnosis not present

## 2021-09-14 DIAGNOSIS — E039 Hypothyroidism, unspecified: Secondary | ICD-10-CM | POA: Diagnosis not present

## 2021-09-14 DIAGNOSIS — I679 Cerebrovascular disease, unspecified: Secondary | ICD-10-CM | POA: Diagnosis not present

## 2021-09-14 DIAGNOSIS — I10 Essential (primary) hypertension: Secondary | ICD-10-CM | POA: Diagnosis not present

## 2021-09-14 NOTE — Telephone Encounter (Signed)
Called pt to schedule her sleep study but pt states she would like to call back at a better time ?

## 2021-09-20 ENCOUNTER — Telehealth: Payer: Self-pay

## 2021-09-20 ENCOUNTER — Ambulatory Visit: Payer: Federal, State, Local not specified - PPO | Admitting: Adult Health

## 2021-09-20 NOTE — Telephone Encounter (Signed)
We have attempted to call the patient 2 times to schedule sleep study. Patient has been unavailable at the phone numbers we have on file and has not returned our calls. If patient calls back we will schedule them for their sleep study. ° °

## 2022-02-08 DIAGNOSIS — I10 Essential (primary) hypertension: Secondary | ICD-10-CM | POA: Diagnosis not present

## 2022-02-08 DIAGNOSIS — Z23 Encounter for immunization: Secondary | ICD-10-CM | POA: Diagnosis not present

## 2022-02-08 DIAGNOSIS — Z0000 Encounter for general adult medical examination without abnormal findings: Secondary | ICD-10-CM | POA: Diagnosis not present

## 2022-02-08 DIAGNOSIS — E039 Hypothyroidism, unspecified: Secondary | ICD-10-CM | POA: Diagnosis not present

## 2022-02-08 DIAGNOSIS — E782 Mixed hyperlipidemia: Secondary | ICD-10-CM | POA: Diagnosis not present

## 2022-02-08 DIAGNOSIS — I679 Cerebrovascular disease, unspecified: Secondary | ICD-10-CM | POA: Diagnosis not present

## 2022-02-21 DIAGNOSIS — R944 Abnormal results of kidney function studies: Secondary | ICD-10-CM | POA: Diagnosis not present

## 2022-03-24 DIAGNOSIS — Z124 Encounter for screening for malignant neoplasm of cervix: Secondary | ICD-10-CM | POA: Diagnosis not present

## 2022-03-24 DIAGNOSIS — I679 Cerebrovascular disease, unspecified: Secondary | ICD-10-CM | POA: Diagnosis not present

## 2022-03-24 DIAGNOSIS — E039 Hypothyroidism, unspecified: Secondary | ICD-10-CM | POA: Diagnosis not present

## 2022-03-24 DIAGNOSIS — E782 Mixed hyperlipidemia: Secondary | ICD-10-CM | POA: Diagnosis not present

## 2022-03-24 DIAGNOSIS — I10 Essential (primary) hypertension: Secondary | ICD-10-CM | POA: Diagnosis not present

## 2022-04-01 DIAGNOSIS — I129 Hypertensive chronic kidney disease with stage 1 through stage 4 chronic kidney disease, or unspecified chronic kidney disease: Secondary | ICD-10-CM | POA: Diagnosis not present

## 2022-04-01 DIAGNOSIS — N1832 Chronic kidney disease, stage 3b: Secondary | ICD-10-CM | POA: Diagnosis not present

## 2022-04-01 DIAGNOSIS — R809 Proteinuria, unspecified: Secondary | ICD-10-CM | POA: Diagnosis not present

## 2022-04-01 DIAGNOSIS — E875 Hyperkalemia: Secondary | ICD-10-CM | POA: Diagnosis not present

## 2022-04-05 ENCOUNTER — Other Ambulatory Visit: Payer: Self-pay | Admitting: Nephrology

## 2022-04-05 DIAGNOSIS — I129 Hypertensive chronic kidney disease with stage 1 through stage 4 chronic kidney disease, or unspecified chronic kidney disease: Secondary | ICD-10-CM

## 2022-04-05 DIAGNOSIS — R809 Proteinuria, unspecified: Secondary | ICD-10-CM

## 2022-04-05 DIAGNOSIS — R29818 Other symptoms and signs involving the nervous system: Secondary | ICD-10-CM

## 2022-04-05 DIAGNOSIS — E875 Hyperkalemia: Secondary | ICD-10-CM

## 2022-04-05 DIAGNOSIS — N1832 Chronic kidney disease, stage 3b: Secondary | ICD-10-CM

## 2022-04-07 ENCOUNTER — Ambulatory Visit
Admission: RE | Admit: 2022-04-07 | Discharge: 2022-04-07 | Disposition: A | Discharge status: Home / Self Care | Payer: Federal, State, Local not specified - PPO | Source: Ambulatory Visit | Attending: Nephrology | Admitting: Nephrology

## 2022-04-07 DIAGNOSIS — E875 Hyperkalemia: Secondary | ICD-10-CM

## 2022-04-07 DIAGNOSIS — I129 Hypertensive chronic kidney disease with stage 1 through stage 4 chronic kidney disease, or unspecified chronic kidney disease: Secondary | ICD-10-CM | POA: Diagnosis not present

## 2022-04-07 DIAGNOSIS — N1832 Chronic kidney disease, stage 3b: Secondary | ICD-10-CM

## 2022-04-07 DIAGNOSIS — N189 Chronic kidney disease, unspecified: Secondary | ICD-10-CM | POA: Diagnosis not present

## 2022-04-07 DIAGNOSIS — R809 Proteinuria, unspecified: Secondary | ICD-10-CM

## 2022-04-07 DIAGNOSIS — R29818 Other symptoms and signs involving the nervous system: Secondary | ICD-10-CM

## 2022-06-08 DIAGNOSIS — D128 Benign neoplasm of rectum: Secondary | ICD-10-CM | POA: Diagnosis not present

## 2022-06-08 DIAGNOSIS — Z1211 Encounter for screening for malignant neoplasm of colon: Secondary | ICD-10-CM | POA: Diagnosis not present

## 2022-06-08 DIAGNOSIS — D122 Benign neoplasm of ascending colon: Secondary | ICD-10-CM | POA: Diagnosis not present

## 2022-06-08 DIAGNOSIS — K648 Other hemorrhoids: Secondary | ICD-10-CM | POA: Diagnosis not present

## 2022-07-29 DIAGNOSIS — E875 Hyperkalemia: Secondary | ICD-10-CM | POA: Diagnosis not present

## 2022-07-29 DIAGNOSIS — R809 Proteinuria, unspecified: Secondary | ICD-10-CM | POA: Diagnosis not present

## 2022-07-29 DIAGNOSIS — N1832 Chronic kidney disease, stage 3b: Secondary | ICD-10-CM | POA: Diagnosis not present

## 2022-07-29 DIAGNOSIS — I129 Hypertensive chronic kidney disease with stage 1 through stage 4 chronic kidney disease, or unspecified chronic kidney disease: Secondary | ICD-10-CM | POA: Diagnosis not present

## 2022-09-22 DIAGNOSIS — I679 Cerebrovascular disease, unspecified: Secondary | ICD-10-CM | POA: Diagnosis not present

## 2022-09-22 DIAGNOSIS — I10 Essential (primary) hypertension: Secondary | ICD-10-CM | POA: Diagnosis not present

## 2022-09-22 DIAGNOSIS — E782 Mixed hyperlipidemia: Secondary | ICD-10-CM | POA: Diagnosis not present

## 2022-09-22 DIAGNOSIS — E039 Hypothyroidism, unspecified: Secondary | ICD-10-CM | POA: Diagnosis not present

## 2023-01-26 DIAGNOSIS — I129 Hypertensive chronic kidney disease with stage 1 through stage 4 chronic kidney disease, or unspecified chronic kidney disease: Secondary | ICD-10-CM | POA: Diagnosis not present

## 2023-01-26 DIAGNOSIS — R809 Proteinuria, unspecified: Secondary | ICD-10-CM | POA: Diagnosis not present

## 2023-01-26 DIAGNOSIS — E875 Hyperkalemia: Secondary | ICD-10-CM | POA: Diagnosis not present

## 2023-01-26 DIAGNOSIS — N1832 Chronic kidney disease, stage 3b: Secondary | ICD-10-CM | POA: Diagnosis not present

## 2023-02-22 DIAGNOSIS — N1832 Chronic kidney disease, stage 3b: Secondary | ICD-10-CM | POA: Diagnosis not present

## 2023-02-23 DIAGNOSIS — I10 Essential (primary) hypertension: Secondary | ICD-10-CM | POA: Diagnosis not present

## 2023-02-23 DIAGNOSIS — Z1159 Encounter for screening for other viral diseases: Secondary | ICD-10-CM | POA: Diagnosis not present

## 2023-02-23 DIAGNOSIS — N1832 Chronic kidney disease, stage 3b: Secondary | ICD-10-CM | POA: Diagnosis not present

## 2023-02-23 DIAGNOSIS — Z23 Encounter for immunization: Secondary | ICD-10-CM | POA: Diagnosis not present

## 2023-02-23 DIAGNOSIS — E782 Mixed hyperlipidemia: Secondary | ICD-10-CM | POA: Diagnosis not present

## 2023-02-23 DIAGNOSIS — E039 Hypothyroidism, unspecified: Secondary | ICD-10-CM | POA: Diagnosis not present

## 2023-02-23 DIAGNOSIS — I679 Cerebrovascular disease, unspecified: Secondary | ICD-10-CM | POA: Diagnosis not present

## 2023-02-23 DIAGNOSIS — Z0000 Encounter for general adult medical examination without abnormal findings: Secondary | ICD-10-CM | POA: Diagnosis not present

## 2023-05-22 DIAGNOSIS — N1832 Chronic kidney disease, stage 3b: Secondary | ICD-10-CM | POA: Diagnosis not present

## 2023-06-07 DIAGNOSIS — E875 Hyperkalemia: Secondary | ICD-10-CM | POA: Diagnosis not present

## 2023-06-07 DIAGNOSIS — I129 Hypertensive chronic kidney disease with stage 1 through stage 4 chronic kidney disease, or unspecified chronic kidney disease: Secondary | ICD-10-CM | POA: Diagnosis not present

## 2023-06-07 DIAGNOSIS — N1832 Chronic kidney disease, stage 3b: Secondary | ICD-10-CM | POA: Diagnosis not present

## 2023-06-07 DIAGNOSIS — R809 Proteinuria, unspecified: Secondary | ICD-10-CM | POA: Diagnosis not present

## 2023-08-24 DIAGNOSIS — E782 Mixed hyperlipidemia: Secondary | ICD-10-CM | POA: Diagnosis not present

## 2023-08-24 DIAGNOSIS — E039 Hypothyroidism, unspecified: Secondary | ICD-10-CM | POA: Diagnosis not present

## 2023-08-24 DIAGNOSIS — Z23 Encounter for immunization: Secondary | ICD-10-CM | POA: Diagnosis not present

## 2023-08-24 DIAGNOSIS — I679 Cerebrovascular disease, unspecified: Secondary | ICD-10-CM | POA: Diagnosis not present

## 2023-08-24 DIAGNOSIS — I10 Essential (primary) hypertension: Secondary | ICD-10-CM | POA: Diagnosis not present

## 2023-12-13 DIAGNOSIS — N1832 Chronic kidney disease, stage 3b: Secondary | ICD-10-CM | POA: Diagnosis not present

## 2023-12-14 DIAGNOSIS — N1832 Chronic kidney disease, stage 3b: Secondary | ICD-10-CM | POA: Diagnosis not present

## 2023-12-18 DIAGNOSIS — I129 Hypertensive chronic kidney disease with stage 1 through stage 4 chronic kidney disease, or unspecified chronic kidney disease: Secondary | ICD-10-CM | POA: Diagnosis not present

## 2023-12-18 DIAGNOSIS — E875 Hyperkalemia: Secondary | ICD-10-CM | POA: Diagnosis not present

## 2023-12-18 DIAGNOSIS — N1832 Chronic kidney disease, stage 3b: Secondary | ICD-10-CM | POA: Diagnosis not present

## 2024-03-18 DIAGNOSIS — Z0000 Encounter for general adult medical examination without abnormal findings: Secondary | ICD-10-CM | POA: Diagnosis not present

## 2024-03-18 DIAGNOSIS — I10 Essential (primary) hypertension: Secondary | ICD-10-CM | POA: Diagnosis not present

## 2024-03-18 DIAGNOSIS — E039 Hypothyroidism, unspecified: Secondary | ICD-10-CM | POA: Diagnosis not present

## 2024-03-18 DIAGNOSIS — Z23 Encounter for immunization: Secondary | ICD-10-CM | POA: Diagnosis not present

## 2024-03-18 DIAGNOSIS — I679 Cerebrovascular disease, unspecified: Secondary | ICD-10-CM | POA: Diagnosis not present

## 2024-03-18 DIAGNOSIS — E782 Mixed hyperlipidemia: Secondary | ICD-10-CM | POA: Diagnosis not present

## 2024-04-01 DIAGNOSIS — Z1231 Encounter for screening mammogram for malignant neoplasm of breast: Secondary | ICD-10-CM | POA: Diagnosis not present

## 2024-05-20 DIAGNOSIS — E039 Hypothyroidism, unspecified: Secondary | ICD-10-CM | POA: Diagnosis not present
